# Patient Record
Sex: Female | Born: 1965 | Race: White | Hispanic: No | Marital: Married | Smoking: Current every day smoker
Health system: Southern US, Community
[De-identification: ages and names within clinical notes are randomized; demographics above are authoritative.]

## PROBLEM LIST (undated history)

## (undated) DIAGNOSIS — S92909A Unspecified fracture of unspecified foot, initial encounter for closed fracture: Secondary | ICD-10-CM

## (undated) DIAGNOSIS — R238 Other skin changes: Secondary | ICD-10-CM

## (undated) DIAGNOSIS — K589 Irritable bowel syndrome without diarrhea: Secondary | ICD-10-CM

## (undated) DIAGNOSIS — D649 Anemia, unspecified: Secondary | ICD-10-CM

## (undated) DIAGNOSIS — R233 Spontaneous ecchymoses: Secondary | ICD-10-CM

## (undated) DIAGNOSIS — H547 Unspecified visual loss: Secondary | ICD-10-CM

## (undated) DIAGNOSIS — M79606 Pain in leg, unspecified: Secondary | ICD-10-CM

## (undated) DIAGNOSIS — M1990 Unspecified osteoarthritis, unspecified site: Secondary | ICD-10-CM

## (undated) DIAGNOSIS — F32A Depression, unspecified: Secondary | ICD-10-CM

## (undated) DIAGNOSIS — N200 Calculus of kidney: Secondary | ICD-10-CM

## (undated) DIAGNOSIS — L659 Nonscarring hair loss, unspecified: Secondary | ICD-10-CM

## (undated) DIAGNOSIS — F410 Panic disorder [episodic paroxysmal anxiety] without agoraphobia: Secondary | ICD-10-CM

## (undated) DIAGNOSIS — R296 Repeated falls: Secondary | ICD-10-CM

## (undated) DIAGNOSIS — F329 Major depressive disorder, single episode, unspecified: Secondary | ICD-10-CM

## (undated) DIAGNOSIS — G43909 Migraine, unspecified, not intractable, without status migrainosus: Secondary | ICD-10-CM

## (undated) DIAGNOSIS — R200 Anesthesia of skin: Secondary | ICD-10-CM

## (undated) DIAGNOSIS — R413 Other amnesia: Secondary | ICD-10-CM

## (undated) HISTORY — DX: Panic disorder (episodic paroxysmal anxiety): F41.0

## (undated) HISTORY — PX: FOOT SURGERY: SHX648

## (undated) HISTORY — DX: Other amnesia: R41.3

## (undated) HISTORY — DX: Repeated falls: R29.6

## (undated) HISTORY — DX: Migraine, unspecified, not intractable, without status migrainosus: G43.909

## (undated) HISTORY — DX: Unspecified visual loss: H54.7

## (undated) HISTORY — DX: Unspecified osteoarthritis, unspecified site: M19.90

## (undated) HISTORY — DX: Depression, unspecified: F32.A

## (undated) HISTORY — DX: Other skin changes: R23.8

## (undated) HISTORY — PX: ABDOMINAL HYSTERECTOMY: SHX81

## (undated) HISTORY — PX: LAPAROSCOPIC GASTRIC BANDING: SHX1100

## (undated) HISTORY — DX: Irritable bowel syndrome, unspecified: K58.9

## (undated) HISTORY — PX: CHOLECYSTECTOMY: SHX55

## (undated) HISTORY — DX: Pain in leg, unspecified: M79.606

## (undated) HISTORY — DX: Calculus of kidney: N20.0

## (undated) HISTORY — DX: Major depressive disorder, single episode, unspecified: F32.9

## (undated) HISTORY — DX: Spontaneous ecchymoses: R23.3

## (undated) HISTORY — PX: TONSILLECTOMY: SUR1361

## (undated) HISTORY — PX: ABDOMINOPLASTY: SUR9

## (undated) HISTORY — DX: Unspecified fracture of unspecified foot, initial encounter for closed fracture: S92.909A

## (undated) HISTORY — DX: Anemia, unspecified: D64.9

## (undated) HISTORY — DX: Nonscarring hair loss, unspecified: L65.9

## (undated) HISTORY — DX: Anesthesia of skin: R20.0

---

## 1999-05-20 ENCOUNTER — Emergency Department (HOSPITAL_COMMUNITY): Admission: EM | Admit: 1999-05-20 | Discharge: 1999-05-20 | Payer: Self-pay | Admitting: Emergency Medicine

## 1999-08-03 ENCOUNTER — Emergency Department (HOSPITAL_COMMUNITY): Admission: EM | Admit: 1999-08-03 | Discharge: 1999-08-03 | Payer: Self-pay | Admitting: Emergency Medicine

## 2001-01-22 ENCOUNTER — Other Ambulatory Visit: Admission: RE | Admit: 2001-01-22 | Discharge: 2001-01-22 | Payer: Self-pay | Admitting: Family Medicine

## 2001-03-01 ENCOUNTER — Emergency Department (HOSPITAL_COMMUNITY): Admission: EM | Admit: 2001-03-01 | Discharge: 2001-03-02 | Payer: Self-pay | Admitting: Emergency Medicine

## 2001-03-02 ENCOUNTER — Encounter: Payer: Self-pay | Admitting: Emergency Medicine

## 2001-03-04 ENCOUNTER — Encounter: Payer: Self-pay | Admitting: Urology

## 2001-03-04 ENCOUNTER — Ambulatory Visit (HOSPITAL_COMMUNITY): Admission: RE | Admit: 2001-03-04 | Discharge: 2001-03-04 | Payer: Self-pay | Admitting: Urology

## 2004-05-07 ENCOUNTER — Encounter: Admission: RE | Admit: 2004-05-07 | Discharge: 2004-05-07 | Payer: Self-pay | Admitting: *Deleted

## 2004-05-07 ENCOUNTER — Ambulatory Visit (HOSPITAL_COMMUNITY): Admission: RE | Admit: 2004-05-07 | Discharge: 2004-05-07 | Payer: Self-pay | Admitting: *Deleted

## 2004-05-22 ENCOUNTER — Encounter: Admission: RE | Admit: 2004-05-22 | Discharge: 2004-08-20 | Payer: Self-pay | Admitting: *Deleted

## 2004-07-03 ENCOUNTER — Encounter: Admission: RE | Admit: 2004-07-03 | Discharge: 2004-07-03 | Payer: Self-pay | Admitting: *Deleted

## 2004-09-03 ENCOUNTER — Observation Stay (HOSPITAL_COMMUNITY): Admission: RE | Admit: 2004-09-03 | Discharge: 2004-09-04 | Payer: Self-pay | Admitting: *Deleted

## 2004-10-08 ENCOUNTER — Encounter: Admission: RE | Admit: 2004-10-08 | Discharge: 2004-12-17 | Payer: Self-pay | Admitting: *Deleted

## 2005-03-14 ENCOUNTER — Encounter: Admission: RE | Admit: 2005-03-14 | Discharge: 2005-03-14 | Payer: Self-pay | Admitting: *Deleted

## 2006-10-29 ENCOUNTER — Ambulatory Visit (HOSPITAL_COMMUNITY): Admission: RE | Admit: 2006-10-29 | Discharge: 2006-10-29 | Payer: Self-pay | Admitting: Obstetrics and Gynecology

## 2006-10-29 ENCOUNTER — Encounter (INDEPENDENT_AMBULATORY_CARE_PROVIDER_SITE_OTHER): Payer: Self-pay | Admitting: Specialist

## 2007-01-20 ENCOUNTER — Inpatient Hospital Stay (HOSPITAL_COMMUNITY): Admission: AD | Admit: 2007-01-20 | Discharge: 2007-01-20 | Payer: Self-pay | Admitting: Obstetrics and Gynecology

## 2007-04-08 ENCOUNTER — Inpatient Hospital Stay (HOSPITAL_COMMUNITY): Admission: RE | Admit: 2007-04-08 | Discharge: 2007-04-10 | Payer: Self-pay | Admitting: Obstetrics and Gynecology

## 2007-04-08 ENCOUNTER — Encounter (INDEPENDENT_AMBULATORY_CARE_PROVIDER_SITE_OTHER): Payer: Self-pay | Admitting: Specialist

## 2007-06-03 ENCOUNTER — Emergency Department (HOSPITAL_COMMUNITY): Admission: EM | Admit: 2007-06-03 | Discharge: 2007-06-03 | Payer: Self-pay | Admitting: Emergency Medicine

## 2008-04-23 ENCOUNTER — Emergency Department (HOSPITAL_COMMUNITY): Admission: EM | Admit: 2008-04-23 | Discharge: 2008-04-24 | Payer: Self-pay | Admitting: Emergency Medicine

## 2008-05-02 ENCOUNTER — Encounter: Admission: RE | Admit: 2008-05-02 | Discharge: 2008-05-02 | Payer: Self-pay | Admitting: Family Medicine

## 2008-05-02 ENCOUNTER — Observation Stay (HOSPITAL_COMMUNITY): Admission: EM | Admit: 2008-05-02 | Discharge: 2008-05-03 | Payer: Self-pay | Admitting: Emergency Medicine

## 2008-05-06 ENCOUNTER — Encounter (INDEPENDENT_AMBULATORY_CARE_PROVIDER_SITE_OTHER): Payer: Self-pay | Admitting: *Deleted

## 2008-05-06 ENCOUNTER — Ambulatory Visit (HOSPITAL_COMMUNITY): Admission: RE | Admit: 2008-05-06 | Discharge: 2008-05-07 | Payer: Self-pay | Admitting: *Deleted

## 2010-03-25 ENCOUNTER — Encounter: Admission: RE | Admit: 2010-03-25 | Discharge: 2010-03-25 | Payer: Self-pay | Admitting: Family Medicine

## 2010-12-31 ENCOUNTER — Emergency Department (HOSPITAL_BASED_OUTPATIENT_CLINIC_OR_DEPARTMENT_OTHER)
Admission: EM | Admit: 2010-12-31 | Discharge: 2010-12-31 | Payer: Self-pay | Source: Home / Self Care | Admitting: Emergency Medicine

## 2011-01-10 ENCOUNTER — Encounter
Admission: RE | Admit: 2011-01-10 | Discharge: 2011-01-10 | Payer: Self-pay | Source: Home / Self Care | Attending: Surgery | Admitting: Surgery

## 2011-03-10 LAB — DIFFERENTIAL
Basophils Absolute: 0 10*3/uL (ref 0.0–0.1)
Basophils Relative: 0 % (ref 0–1)
Eosinophils Absolute: 0.1 10*3/uL (ref 0.0–0.7)
Eosinophils Relative: 1 % (ref 0–5)
Lymphocytes Relative: 29 % (ref 12–46)
Lymphs Abs: 1.9 10*3/uL (ref 0.7–4.0)
Monocytes Absolute: 0.5 10*3/uL (ref 0.1–1.0)
Monocytes Relative: 8 % (ref 3–12)
Neutro Abs: 4 10*3/uL (ref 1.7–7.7)
Neutrophils Relative %: 62 % (ref 43–77)

## 2011-03-10 LAB — BASIC METABOLIC PANEL
BUN: 6 mg/dL (ref 6–23)
CO2: 24 mEq/L (ref 19–32)
Calcium: 8.9 mg/dL (ref 8.4–10.5)
Chloride: 108 mEq/L (ref 96–112)
Creatinine, Ser: 0.8 mg/dL (ref 0.4–1.2)
GFR calc Af Amer: >60 mL/min (ref >60)
GFR calc non Af Amer: >60 mL/min (ref >60)
Glucose, Bld: 77 mg/dL (ref 70–99)
Potassium: 4 mEq/L (ref 3.5–5.1)
Sodium: 144 mEq/L (ref 135–145)

## 2011-03-10 LAB — URINALYSIS, ROUTINE W REFLEX MICROSCOPIC
Bilirubin Urine: NEGATIVE
Glucose, UA: NEGATIVE mg/dL
Hgb urine dipstick: NEGATIVE
Ketones, ur: NEGATIVE mg/dL
Nitrite: NEGATIVE
Protein, ur: NEGATIVE mg/dL
Specific Gravity, Urine: 1.02 (ref 1.005–1.030)
Urobilinogen, UA: 1 mg/dL (ref 0.0–1.0)
pH: 6.5 (ref 5.0–8.0)

## 2011-03-10 LAB — CBC
HCT: 41.7 % (ref 36.0–46.0)
Hemoglobin: 14.6 g/dL (ref 12.0–15.0)
MCH: 30.9 pg (ref 26.0–34.0)
MCHC: 35 g/dL (ref 30.0–36.0)
MCV: 88.3 fL (ref 78.0–100.0)
Platelets: 271 10*3/uL (ref 150–400)
RBC: 4.72 MIL/uL (ref 3.87–5.11)
RDW: 12.5 % (ref 11.5–15.5)
WBC: 6.5 10*3/uL (ref 4.0–10.5)

## 2011-05-13 NOTE — H&P (Signed)
[name], [name]                ACCOUNT NO.:  [id number]   MEDICAL RECORD NO.:  [id number]          PATIENT TYPE:  EMS   LOCATION:  ED                           FACILITY:  [facility]   PHYSICIAN:  John C. Madilyn Fireman, M.D.    DATE OF BIRTH:  06-16-1966   DATE OF ADMISSION:  05/02/2008  DATE OF DISCHARGE:                              HISTORY & PHYSICAL   CHIEF COMPLAINT:  Abdominal pain, nausea, and vomiting.   HISTORY OF PRESENT ILLNESS:  The patient is a 45 year old white female  who presents with recurrent epigastric abdominal pain, nausea, and  vomiting.  She had a similar episode a week ago, came to the emergency  room, and had abdominal CT scan which was unrevealing, and had  elevated  liver function tests, with bilirubin 1.4, AST 210, ALT 81, and a  slightly elevated lipase at 76.  At that time, it was noted that on her  abdominal CT, which was otherwise unrevealing, there was some pulmonary  small pulmonary.  Will nodules at the bases, and a CT of the chest was  recommended.  This was done in addition to abdominal ultrasound this  morning.  The abdominal ultrasound showed a normal liver, with a 7.9 mm  common bile duct and numerous small layering echogenic gallstones with  acoustic shadowing, with no evidence of acute cholecystitis.  The chest  CT showed several pulmonary nodules, no larger than 5.7 mm.  They were  felt likely to be benign, and they recommended a follow-up noncontrast  CT in 6 months.   Shortly after her ultrasound, she ate at [employer] and then  developed a recurrent severe abdominal pain, nausea,  and vomiting, and  came back to the emergency room.  This time, she had a bilirubin of 1.7,  ALT 125, AST 346, and a lipase of 89.  Her pain has been somewhat  relieved by medicines given in the emergency room.   PAST MEDICAL HISTORY:  Obesity, status post lap band surgery 3 years  ago.   SURGERIES:  1. Hysterectomy  2. Cyst removed from the neck.  3. Tummy  tuck surgery.   MEDICATIONS:  None.   ALLERGIES:  None.   SOCIAL HISTORY:  The patient is married.  She smokes 1 pack cigarettes a  day.  Denies alcohol use.  She works in a day care center.   FAMILY HISTORY:  Positive for hypertension, otherwise unremarkable.   PHYSICAL EXAMINATION:  GENERAL:  Well-developed, well-nourished,  moderately obese white female, currently in no acute distress.  HEART:  Regular rate and rhythm, without murmurs.  LUNGS:  Clear.  ABDOMEN:  Soft, nondistended, with normoactive bowel sounds.  There was  mild epigastric tenderness, with no Murphy's sign.   IMPRESSION:  1. Symptomatic gallstones, with possible common retained or passed      common bile duct stones.  2..  History of lap band surgery.   PLAN:  Will admit.  Follow liver function tests, amylase, and lipase  tomorrow.  Consult surgery.  Make decisions of whether to perform preop  ERCP prior to eventual  cholecystectomy, and whether lap band will need  to be deflated for this.           ______________________________  Everardo All. Madilyn Fireman, M.D.     JCH/MEDQ  D:  05/02/2008  T:  05/02/2008  Job:  [id number]   cc:   Rema Fendt, PA  [facility]   Anselm Pancoast. Zachery Dakins, M.D.  [address]  [place]  [place] [address]

## 2011-05-13 NOTE — Op Note (Signed)
[name], [name] NO.:  [id number]   MEDICAL RECORD NO.:  [id number]          PATIENT TYPE:  OIB   LOCATION:  1306                         FACILITY:  [facility]   PHYSICIAN:  Alfonse Ras, MD   DATE OF BIRTH:  05-17-1966   DATE OF PROCEDURE:  05/06/2008  DATE OF DISCHARGE:                               OPERATIVE REPORT   PREOPERATIVE DIAGNOSES:  Probable history of choledocholithiasis,  cholelithiasis, and possible malfunctioning lap band port.   POSTOPERATIVE DIAGNOSES:  Probable history of choledocholithiasis,  cholelithiasis, and possible malfunctioning lap band port.   PROCEDURE:  Laparoscopic cholecystectomy with intraoperative  cholangiogram and replacement of the lap band port.   FINDINGS:  Normal cholangiogram with difficult visualization of the  common hepatic duct but visualization of the right and left hepatic  ducts, common duct, and free flow into the duodenum without obstruction.   CONSULTING PHYSICIAN:  Dr. Oley Balm, radiology.   SURGEON:  Alfonse Ras, MD   ASSISTANT:  Clovis Pu. Cornett, M.D.   ANESTHESIA:  General.   DESCRIPTION:  The patient was taken to the operating room, placed in the  supine position.  After adequate general anesthesia was induced using  endotracheal tube, the abdomen was prepped and draped in the normal  sterile fashion.  The lap band port was accessed and I aspirated only 1  cc of saline which was all that was present.  At the time of the last  fill, the patient had 3 cc within it and therefore, I determined her  port to be malfunctioning.  That fluid was removed.   I then made a supraumbilical vertical incision and dissected down to the  fascia which was opened vertically under direct vision.  The tubing was  visualized in the anterior aspect of this incision.  An 0 Vicryl purse-  string suture was placed around the fascial defect and the peritoneum  was entered.  The [facility] trocar was placed in the  abdomen, and  pneumoperitoneum was obtained.  Under direct visualization, 11 mm trocar  was placed in subxiphoid region, two 5 mm ports were placed in the right  abdomen.  The gallbladder was identified and retracted cephalad.  Dissection at the infundibulum easily identified the cystic duct and  common bile duct.  Critical view was obtained.  The cystic duct was  clipped proximally and ductotomy was made.  Cholangiogram was performed  which showed free flow into the duodenum and normal common bile duct.  However, the right hepatic duct could be visualized but the common  hepatic duct and left hepatic duct were poorly visualized.  The  cholangiocatheter was then pulled back and cholangiogram was then  performed again.  There was some appearance with the infraumbilical  trocar and so that was removed and repeat cholangiogram was performed.  This did show flow into the left hepatic duct and some vague  visualization of the common hepatic duct.  Because of good anatomic  delineation, I opted to remove the cholangiocatheter and divided the  cystic duct right up near the gallbladder.  Cystic artery was  dissected  in a similar fashion, triply clipped, and divided.  The gallbladder was  taken off the gallbladder bed using Bovie electrocautery.  There was a  small rent made in the posterior wall of the gallbladder during  dissection with minimal bilious spillage.  The gallbladder was placed in  EndoCatch bag and the right upper quadrant was copiously irrigated.   I then turned my attention to replacement of the lap band.  The port was  injected with 3 cc of methylene blue diluted saline and watched the  pouch and band, and there was no evidence of leak.  The gallbladder was  then removed through the umbilical port and the wounds were copiously  irrigated with antibiotic saline. Pneumoperitoneum was released and the  trocars were removed.   I then made an incision over the port in the right mid  abdomen and  dissected down.  The capsule around the port was excised and the port  was mobilized off the fascia.  It was again injected with methylene blue  dye, and there was no obvious evidence of leak but because of the low  volume at initial aspiration, I opted to replace the port.  The tubing  was cut new port was attached after being flushed, and it was fixed to  the anterior abdominal wall fascia with interrupted 2-0 Prolene.  It was  injected with 2.5 mL of saline.  The wounds again were copiously  irrigated with antibiotic saline.  Skin was closed with staples.  Sterile dressings were applied.  The patient tolerated the procedure  well and went to PACU in good condition.      Alfonse Ras, MD  Electronically Signed     KRE/MEDQ  D:  05/06/2008  T:  05/07/2008  Job:  [id number]

## 2011-05-16 NOTE — Op Note (Signed)
NAME:  SIAH, KANNAN                          ACCOUNT NO.:  [id number]   MEDICAL RECORD NO.:  [id number]                   PATIENT TYPE:  OBV   LOCATION:  0484                                 FACILITY[facility]   PHYSICIAN:  Vikki Ports, M.D.         DATE OF BIRTH:  01/16/1966   DATE OF PROCEDURE:  DATE OF DISCHARGE:                                 OPERATIVE REPORT   No dictation.                                               Vikki Ports, M.D.    KRH/MEDQ  D:  09/03/2004  T:  09/04/2004  Job:  [id number]

## 2011-05-16 NOTE — H&P (Signed)
[name], [name]                ACCOUNT NO.:  [id number]   MEDICAL RECORD NO.:  [id number]          PATIENT TYPE:  AMB   LOCATION:  DAY                           FACILITY:  [facility]   PHYSICIAN:  Tilda Burrow, M.D. DATE OF BIRTH:  12-18-1966   DATE OF ADMISSION:  DATE OF DISCHARGE:  LH                              HISTORY & PHYSICAL   ADMITTING DIAGNOSES:  1. Failed endometrial ablation, suspected adenomyosis.  2. History of cesarean section with infection.  3. Abdominal wall laxity, status post weight loss.  4. History of gastric banding procedure.   HISTORY OF PRESENT ILLNESS:  This 45 year old female, gravida 2, para 1,  is admitted for abdominal hysterectomy.  [name] has a history of an  endometrial ablation in [place] to address heavy bleeding.  She had a  failed response to the ablation.  She has had recommended hysterectomy  with the patient deciding to seek second opinion.  Given the severity of  the bleeding with associated pain of menses, hysterectomy certainly  seems reasonable.  She desires to have ovarian preservation, unless  gross abnormalities are identified.   Additionally, [name] desires partial panniculectomy to address the  significant abdominal wall laxity that has accompanied her weight loss.  Currently, her weight is approximately 170, after a 100-pound weight  loss from laparotomy banding procedure performed by Dr. Tenny Craw.  Plans are  for abdominal hysterectomy through a Pelosi-type incision, which will be  extended laterally to excise redundant skin and subcutaneous tissue  fatty tissue.  The patient additionally will require repositioning of  the umbilicus.  She has a left Lap-Band access site just superior and to  the right of the umbilicus.  We will be careful to allow access to this  port site to remain.  She is aware that there will be additional  scarring and will be physical changes in the area of the umbilicus.   PAST MEDICAL HISTORY:  Pap  smears normal, most recently September 2007.   ALLERGIES:  No known drug allergies.   CURRENT MEDICATIONS:  None.   SURGICAL HISTORY:  1. C-section 16 years ago with postoperative wound infection.  2. Lap banding in 2005.  3. History of adenomyosis and small fibroid in the uterus.  4. She has a history of mild depression, not currently considered      active by patient.   SOCIAL HISTORY:  Consists of walking.  She smokes 1/4 pack per day and  is a nondrinker.   PHYSICAL EXAMINATION:  VITAL SIGNS:  Height 5 feet 6 inches, weight 172.  Blood pressure 108/82.  HEENT:  Pupils are equal, round and reactive.  Extraocular movements  intact.  NECK:  Supple.  CHEST:  Clear to auscultation.  ABDOMEN:  Abdominal wall laxity with a very low C-section incision just  above the mons pubis.  There is scarring and retraction in this area.  The patient has some chronic moisture problems in this area as well as  at the umbilicus associated with her skin laxity.  PELVIC:  External genitalia:  Normal female.  Vaginal exam:  Normal,  adequate secretions.  Cervix normal.   ACCESSORY CLINICAL DATA:  Recent Pap smear, 2007, reported as normal.   Ultrasound shows myometrial abnormality consistent with adenomyosis.   PLAN:  Abdominal hysterectomy, preservation of ovaries unless distinct  problems identified, partial panniculectomy with umbilical  repositioning.      Tilda Burrow, M.D.  Electronically Signed     JVF/MEDQ  D:  04/08/2007  T:  04/08/2007  Job:  [id number]

## 2011-05-16 NOTE — Op Note (Signed)
NAMETHEO, REITHER NO.:  [id number]   MEDICAL RECORD NO.:  [id number]          PATIENT TYPE:  INP   LOCATION:  A428                          FACILITY:  [facility]   PHYSICIAN:  Tilda Burrow, M.D. DATE OF BIRTH:  [birth year]   DATE OF PROCEDURE:  04/08/2007  DATE OF DISCHARGE:  04/10/2007                               OPERATIVE REPORT   PREOPERATIVE DIAGNOSIS:  Failed endometrial ablation.   POSTOPERATIVE DIAGNOSIS:  Failed endometrial ablation.   PROCEDURE:  Abdominal hysterectomy, panniculectomy with umbilical hernia  repositioning.   SURGEON:  Tilda Burrow, M.D.   ASSISTANT:  __________   ANESTHESIA:  General.   SPECIMENS:  Uterus, panniculus sent to lab.   ESTIMATED BLOOD LOSS:  250 mL.   COMPLICATIONS:  None.   INDICATIONS:  A 45 year old status post gastric banding procedure with  100-pound weight loss and marked abdominal wall laxity.   DETAILS OF PROCEDURE:  Patient was taken to the operating room, prepped  and draped for lower abdominal surgery.  Previously marked lower  abdominal laxity was treated by making an 80-cm-long elliptical incision  from the mons pubis just below the old poorly healed C section scar  transversely to within 4 cm of the inguinal crease staying approximately  3 cm above the inguinal area and extending around to approximately 10 cm  past the anterior/superior iliac crest.  The skin was undermined being  careful to maintain a good layer of fatty tissue overlying the fascia,  with mobilization of the layer of skin upward.  The previously marked  superior aspect of the incision could be breeched from underneath and  then the upper most aspects of the incision were transected removing an  80-cm long by approximately 20 cm wide ellipse of skin and fatty tissue.  The lateral aspects of the incision were irrigated and then closed with  2 layers of subcuticular 2-0 plain suture pulling the fatty tissue edges  together  side-to-side with excellent cosmetic and tissue edge  approximation.  The middle third of the incision was left opened and  then we proceeded with the hysterectomy.   Hysterectomy involved a Pfannenstiel type incision midline in through  the peritoneal cavity and elevation of the bowel out of the pelvis.  Uterus was grasped.  Round ligaments were taken down, clamped, cut and  suture ligated.  The bladder flap was developed anteriorly.  The  infundibulopelvic ligaments identified.  The ovaries visualized and the  round ligament and utero-ovarian ligament and fallopian tube taken down  as a pedicle, clamping, cutting and suture ligating.  The ovaries were  preserved.  The broad ligaments were then skeletonized down to the  uterine vessels which were cross clamped with a curved Haney clamp with  a Kelly for backbleeding, transected and suture ligated with 0 chromic.  Upper and lower cardinal ligaments were then serially clamped, cut and  suture ligated with 0 chromic marching down to the level of the cervix  where an anterior stab incision into the cervical vaginal fornix was  performed.  The cuff and cervix separated, and the  cuff grasped with 4  Kocher clamps.  Aldridge stitches were placed at each lateral vaginal  angle attaching the lower cardinal ligaments to the cuff remnants.  The  remainder of the cuff was closed with interrupted 0 Vicryl sutures.  The  pelvis was irrigated.  The bladder flap reapproximated with 2-0 chromic,  and then laparotomy equipment removed.  Sponge and needle counts  correct.  Anterior peritoneum closed with 2-0 chromic, and fascia  trimmed of approximately 2 cm of fibrous tissue, and then pulled  together in the midline with good approximation.  Subcutaneous tissue  were then closed after placing a flat JP drain in the lateral aspects of  the incision and allowing it to exit through a separate stab incision in  the suprapubic area.  Prior to full closure of  this, we proceeded with  the umbilical release.   As noted earlier, the patient has a port site just superior and to the  right of the umbilicus where the gastric band can be accessed for  further insufflation of deflation.  Throughout the umbilical  repositioning, this was kept cautiously and careful attention so that we  did not contact it at any time during the dissection.  The umbilicus was  circumscribed with Bovie cautery, dissected free with a small loop of  fatty tissue around it, and released so that it could fall into the  subcutaneous tissue.  Then the Pfannenstiel type incision was pulled  downward and repositioned, allowing [place] to identify a site approximately  5-7 cm above the original umbilical site where an opening could be made  and the umbilicus pulled through.  The umbilicus was repositioned using  4 interrupted sutures of 2-0 plain to pull the umbilicus to the surface,  and then stapled in position using standard staples.   The Pfannenstiel incision was then closed with staples, and subcutaneous  2-0 plain suture, and then the JP drains sutured in place and then  decompressed to develop suction.  EBL 250 mL.  Sponge and needle counts  correct.  Patient went to recovery room in good condition.      Tilda Burrow, M.D.  Electronically Signed     JVF/MEDQ  D:  04/15/2007  T:  04/16/2007  Job:  [id number]   cc:   ______________

## 2011-05-16 NOTE — Op Note (Signed)
[name], [name]                          ACCOUNT NO.:  [id number]   MEDICAL RECORD NO.:  [id number]                   PATIENT TYPE:  OBV   LOCATION:  0484                                 [facility]AC[facility]   PHYSICIAN:  Vikki Ports, M.D.         DATE OF BIRTH:  1966-05-20   DATE OF PROCEDURE:  09/03/2004  DATE OF DISCHARGE:                                 OPERATIVE REPORT   PREOPERATIVE DIAGNOSIS:  Morbid obesity.   POSTOPERATIVE DIAGNOSIS:  Morbid obesity.   PROCEDURE:  Laparoscopic adjustable gastric banding.   SURGEON:  Danna Hefty, MD   ASSISTANT:  Jaclynn Guarneri, MD   ANESTHESIA:  General.   DESCRIPTION OF PROCEDURE:  The patient was taken to the operating room and  placed in supine position.  After adequate general anesthesia was induced  using endotracheal tube, the abdomen was prepped and draped in normal  sterile fashion.  Using an OptiView 12 mm trocar in the left upper quadrant,  the abdomen was entered and pneumoperitoneum was obtained under direct  visualization.  Additional 11 mm trocars were placed in the right upper  quadrant, and an additional 12 mm trocar was placed in the right mid  abdomen.  Additional 5 mm port was placed in the left abdomen.  The stomach  was visualized.  There was no other intra-abdominal pathology noted.  The  angle of His was sharply and bluntly dissected.  The lap band passer was  then placed posterior to the esophagus and brought out at the angle of His.  The band was then placed in the abdomen and brought retroesophageally and  closed.  This was accomplished over the dilating balloon.  Three separate  Ethibond serosal-to-serosal sutures were placed on the proximal stomach to  secure the band in place.  It was in good orientation in the 2 to 8  orientation.  The tubing was then brought out through the most inferior  port.  Pneumoperitoneum was released.  Trocar was removed.  The tubing was  connected to the port,  and the port was tacked to the anterior abdominal  fascia with interrupted 2-0 Prolenes.  That incision was closed with a  running subcutaneous 2-0 Vicryl suture.  Skin incisions were closed with  staples.  Incisions were injected with 0.5 Marcaine.  The patient tolerated  the procedure well and went to PACU in good condition.                                               Vikki Ports, M.D.    KRH/MEDQ  D:  09/04/2004  T:  09/04/2004  Job:  [id number]

## 2011-05-16 NOTE — Op Note (Signed)
[name], [name] NO.:  [id number]   MEDICAL RECORD NO.:  [id number]          PATIENT TYPE:  AMB   LOCATION:  SDC                           FACILITY:  [facility]   PHYSICIAN:  Miguel Aschoff, M.D.       DATE OF BIRTH:  Feb 15, 1966   DATE OF PROCEDURE:  10/29/2006  DATE OF DISCHARGE:                                 OPERATIVE REPORT   PREOPERATIVE DIAGNOSIS:  Menorrhagia.   POSTOPERATIVE DIAGNOSIS:  Menorrhagia.   PROCEDURE:  Dilatation and curettage; attempted Novasure ablation, followed  by ThermaChoice endometrial ablation.   SURGEON:  Miguel Aschoff, M.D.   ANESTHESIA:  General.   COMPLICATIONS:  None.   JUSTIFICATION:  The patient is a 45 year old white female with history of  progressively heavier menses, interfering with her lifestyle and causing her  to bleed through her clothing.  The patient at this point now desires a  procedure carried out in attempt to control her heavy menses.  The risks and  benefits of the procedure were discussed with the patient.  The patient has  given informed consent for endometrial ablation.   PROCEDURE:  The patient was taken to the operating room, placed in the  supine position.  General anesthesia was administered without difficulty.  She was then placed in the dorsal lithotomy position, prepped and draped in  the usual sterile fashion.  The bladder was catheterized.  Once this was  done, a speculum was placed in the vaginal vault, anterior cervical lip was  grasped with a tenaculum.  Prior to placement of the speculum, examination  revealed normal external genitalia, normal Bartholin's and Skene's glands  and normal urethra.  The vaginal vault was without gross lesion.  The cervix  was without gross lesion.  The uterus was noted to be somewhat irregular in  shape, approximately 6-8 weeks equivalent size.  The patient was noted have  a history of having a 3 cm uterine fibroid.  The adnexa revealed no masses.  Once the speculum  was in place, the cervix was sounded to 10 cm. The  cervical length of 4 cm was established, for a cavity length of 6 cm.  After  this was done, the Novasure endometrial ablation unit was placed into the  uterine cavity, and cavity width of 3.2 cm was established.  The patient was  noted have a patulous cervix, which did not require any dilatation and was  not possible to pass the cavity assessment test.  This was felt be secondary  to the degree of dilatation of the cervix.  At this point, after 2 attempts  at a cavity assessment test, it was elected to abandon any further attempts  using the Novasure unit; and the ThermaChoice endometrial ablation unit was  brought in.  Without difficulty, an 8-minute treatment cycle at 87 degrees  was carried out with the ThermaChoice balloon, and the treatment was  completed successfully.  Once this was done, the cervix was injected with 18  cc of 1% Xylocaine.  All instruments were removed.  The patient was reversed  from the anesthetic  and taken to the recovery room in satisfactory  condition.  The endometrial curettings previously obtained were sent for  histologic study.   PLAN:  The patient is to be discharged home.   MEDICATIONS FOR HOME:  1. Darvocet N 100 1-3 every 4 hours as needed for pain.  2. Doxycycline one twice a day x3 days.   SPECIAL INSTRUCTIONS:  The patient is to call for any problems such as  fever, pain or heavy bleeding.  She will be seen back in 4 weeks for follow-  up examination.      Miguel Aschoff, M.D.  Electronically Signed     AR/MEDQ  D:  10/29/2006  T:  10/29/2006  Job:  [id number]

## 2011-05-16 NOTE — Discharge Summary (Signed)
[name], [name]                ACCOUNT NO.:  [id number]   MEDICAL RECORD NO.:  [id number]          PATIENT TYPE:  INP   LOCATION:  A428                          FACILITY:  [facility]   PHYSICIAN:  Lazaro Arms, M.D.   DATE OF BIRTH:  15-Apr-1966   DATE OF ADMISSION:  04/08/2007  DATE OF DISCHARGE:  04/12/2008LH                               DISCHARGE SUMMARY   DISCHARGE DIAGNOSES:  1. Status post an abdominal hysterectomy with panniculectomy.  2. Unremarkable postoperative course.   PROCEDURE:  1. Abdominal hysterectomy.  2. Panniculectomy.   Please refer to the History and Physical as well as the Operative Report  for details of admission to the hospital.   HOSPITAL COURSE:  Patient was admitted to the hospital postoperatively.  She had an unremarkable postoperative course.  She tolerated clear  liquids and then a regular diet.  She had the catheter removed a little  bit early because it was quite uncomfortable but she voided without  symptoms.  Her abdominal exam was benign.  She tolerated a transition  from IV to oral pain medicine.  She was ambulatory.  Her incision was  clean, dry and intact at the time of discharge.  Her JP drainage was  appropriate, not excessive, it was mostly serosanguineous fluid.  She  did seem to respond much better to the combination of Percocet with  Toradol.  The morning of postop day #2 she was evaluated and is doing  well.  Her hemoglobin and hematocrit on postop day #1 was 12.4 and 35.7  with a white count 11,300 and, as stated previously, her vitals are  stable and she is afebrile.   She is discharged to home on the morning of postop day #2 on:  1. Percocet 7.5/325, dispense 40, 1-2 q.6 as needed for pain.  2. Toradol 10 mg p.o. q.8h. for 5 days.  3. Ciprofloxacin 500 mg twice a day for 7 days because of the      extensive abdominal wall work as well as the indwelling JP drains.   The patient is also discharged home with an abdominal binder  to use to  help support the abdominal wall to minimize postoperative pain.  She is  given instructions, precautions for return to the office but will be  seen routinely next Friday for evaluation of her incision.      Lazaro Arms, M.D.  Electronically Signed    LHE/MEDQ  D:  04/10/2007  T:  04/10/2007  Job:  [id number]

## 2011-06-17 ENCOUNTER — Encounter (INDEPENDENT_AMBULATORY_CARE_PROVIDER_SITE_OTHER): Payer: Self-pay | Admitting: Surgery

## 2011-09-08 ENCOUNTER — Encounter (INDEPENDENT_AMBULATORY_CARE_PROVIDER_SITE_OTHER): Payer: Self-pay | Admitting: Surgery

## 2011-09-23 LAB — COMPREHENSIVE METABOLIC PANEL
Alkaline Phosphatase: 67
BUN: 4 — ABNORMAL LOW
CO2: 26
GFR calc non Af Amer: >60
Glucose, Bld: 127 — ABNORMAL HIGH
Potassium: 3.9
Total Bilirubin: 1.4 — ABNORMAL HIGH
Total Protein: 6.7

## 2011-09-23 LAB — URINALYSIS, ROUTINE W REFLEX MICROSCOPIC
Bilirubin Urine: NEGATIVE
Glucose, UA: NEGATIVE
Ketones, ur: NEGATIVE
pH: 8

## 2011-09-23 LAB — CBC
HCT: 41.9
Hemoglobin: 14.3
RDW: 12.7

## 2011-09-23 LAB — DIFFERENTIAL
Basophils Absolute: 0.1
Basophils Relative: 1
Neutro Abs: 9 — ABNORMAL HIGH
Neutrophils Relative %: 85 — ABNORMAL HIGH

## 2011-09-23 LAB — LIPASE, BLOOD: Lipase: 76 — ABNORMAL HIGH

## 2011-09-24 LAB — COMPREHENSIVE METABOLIC PANEL
ALT: 125 — ABNORMAL HIGH
ALT: 170 — ABNORMAL HIGH
AST: 27
AST: 346 — ABNORMAL HIGH
Albumin: 3.5
Alkaline Phosphatase: 56
Alkaline Phosphatase: 60
BUN: 3 — ABNORMAL LOW
BUN: 6
CO2: 26
CO2: 27
Calcium: 9.3
Creatinine, Ser: 0.82
GFR calc Af Amer: >60
GFR calc Af Amer: >60
GFR calc non Af Amer: >60
GFR calc non Af Amer: >60
Glucose, Bld: 90
Potassium: 3.7
Potassium: 4.5
Sodium: 138
Sodium: 138
Sodium: 140
Total Bilirubin: 1.4 — ABNORMAL HIGH
Total Protein: 6.5
Total Protein: 6.7

## 2011-09-24 LAB — DIFFERENTIAL
Basophils Relative: 1
Eosinophils Absolute: 0.1
Eosinophils Relative: 1
Eosinophils Relative: 4
Lymphocytes Relative: 13
Lymphs Abs: 1.1
Monocytes Absolute: 0.3
Monocytes Relative: 6
Monocytes Relative: 8
Neutro Abs: 3.4
Neutrophils Relative %: 77

## 2011-09-24 LAB — CBC
HCT: 38.8
Hemoglobin: 13.1
MCHC: 34.1
MCV: 96.8
Platelets: 232
RBC: 3.98
RDW: 12.5
RDW: 12.6
WBC: 5.4

## 2011-09-24 LAB — LIPASE, BLOOD
Lipase: 23
Lipase: 89 — ABNORMAL HIGH

## 2011-09-24 LAB — AMYLASE: Amylase: 54

## 2011-10-16 LAB — LIPASE, BLOOD: Lipase: 57

## 2011-10-16 LAB — CBC
HCT: 42.3
MCV: 92.4
Platelets: 278
RDW: 13.1

## 2011-10-16 LAB — HEPATIC FUNCTION PANEL
Indirect Bilirubin: 0.8
Total Protein: 6.7

## 2011-10-16 LAB — BASIC METABOLIC PANEL
BUN: 8
Chloride: 108
GFR calc non Af Amer: >60
Glucose, Bld: 125 — ABNORMAL HIGH
Potassium: 3.2 — ABNORMAL LOW

## 2011-10-16 LAB — DIFFERENTIAL
Basophils Absolute: 0
Eosinophils Absolute: 0.1
Eosinophils Relative: 1

## 2011-10-16 LAB — URINALYSIS, ROUTINE W REFLEX MICROSCOPIC
Nitrite: NEGATIVE
Specific Gravity, Urine: 1.028
Urobilinogen, UA: 1

## 2011-10-16 LAB — POCT PREGNANCY, URINE
Operator id: 173591
Preg Test, Ur: NEGATIVE

## 2012-01-01 ENCOUNTER — Emergency Department (INDEPENDENT_AMBULATORY_CARE_PROVIDER_SITE_OTHER): Payer: 59

## 2012-01-01 ENCOUNTER — Emergency Department (HOSPITAL_BASED_OUTPATIENT_CLINIC_OR_DEPARTMENT_OTHER)
Admission: EM | Admit: 2012-01-01 | Discharge: 2012-01-01 | Disposition: A | Discharge status: Home / Self Care | Payer: 59 | Attending: Emergency Medicine | Admitting: Emergency Medicine

## 2012-01-01 ENCOUNTER — Encounter (HOSPITAL_BASED_OUTPATIENT_CLINIC_OR_DEPARTMENT_OTHER): Payer: Self-pay | Admitting: *Deleted

## 2012-01-01 DIAGNOSIS — R0989 Other specified symptoms and signs involving the circulatory and respiratory systems: Secondary | ICD-10-CM

## 2012-01-01 DIAGNOSIS — J40 Bronchitis, not specified as acute or chronic: Secondary | ICD-10-CM | POA: Insufficient documentation

## 2012-01-01 DIAGNOSIS — R0602 Shortness of breath: Secondary | ICD-10-CM

## 2012-01-01 DIAGNOSIS — R05 Cough: Secondary | ICD-10-CM

## 2012-01-01 DIAGNOSIS — F17200 Nicotine dependence, unspecified, uncomplicated: Secondary | ICD-10-CM | POA: Insufficient documentation

## 2012-01-01 DIAGNOSIS — J984 Other disorders of lung: Secondary | ICD-10-CM | POA: Insufficient documentation

## 2012-01-01 DIAGNOSIS — I10 Essential (primary) hypertension: Secondary | ICD-10-CM

## 2012-01-01 LAB — CBC
HCT: 46.1 % — ABNORMAL HIGH (ref 36.0–46.0)
MCHC: 34.7 g/dL (ref 30.0–36.0)
RDW: 13.5 % (ref 11.5–15.5)

## 2012-01-01 LAB — COMPREHENSIVE METABOLIC PANEL
AST: 56 U/L — ABNORMAL HIGH (ref 0–37)
Albumin: 3.7 g/dL (ref 3.5–5.2)
Calcium: 9.7 mg/dL (ref 8.4–10.5)
Chloride: 101 mEq/L (ref 96–112)
Creatinine, Ser: 0.9 mg/dL (ref 0.50–1.10)
Total Bilirubin: 0.6 mg/dL (ref 0.3–1.2)
Total Protein: 7.8 g/dL (ref 6.0–8.3)

## 2012-01-01 LAB — DIFFERENTIAL
Basophils Absolute: 0 10*3/uL (ref 0.0–0.1)
Basophils Relative: 1 % (ref 0–1)
Monocytes Absolute: 0.4 10*3/uL (ref 0.1–1.0)
Neutro Abs: 5.5 10*3/uL (ref 1.7–7.7)
Neutrophils Relative %: 66 % (ref 43–77)

## 2012-01-01 MED ORDER — SODIUM CHLORIDE 0.9 % IV SOLN
Freq: Once | INTRAVENOUS | Status: AC
  Administered 2012-01-01: 15:00:00 via INTRAVENOUS

## 2012-01-01 MED ORDER — IOHEXOL 350 MG/ML SOLN
80.0000 mL | Freq: Once | INTRAVENOUS | Status: AC | PRN
  Administered 2012-01-01: 80 mL via INTRAVENOUS

## 2012-01-01 MED ORDER — IBUPROFEN 800 MG PO TABS
ORAL_TABLET | ORAL | Status: AC
  Administered 2012-01-01: 800 mg
  Filled 2012-01-01: qty 1

## 2012-01-01 MED ORDER — AZITHROMYCIN 250 MG PO TABS: 250.0000 mg | ORAL_TABLET | Freq: Every day | ORAL | Status: AC

## 2012-01-01 NOTE — ED Notes (Signed)
Pt to triage in w/c, a/a/ox4 in nad, resp easy and reg, reports one year of intermittant sob, worse over last few days. Pt states she has been seen by her pcp several times for same and is told "panic attacks". Pt states she saw her pcp this am who told her to come to ed for eval. Pt reports cough and congestion x 2 weeks also.

## 2012-01-01 NOTE — ED Provider Notes (Signed)
History     CSN: [id number]  Arrival date & time 01/01/12  1206   First MD Initiated Contact with Patient 01/01/12 1328      Chief Complaint  Patient presents with  . Shortness of Breath    (Consider location/radiation/quality/duration/timing/severity/associated sxs/prior treatment) Patient is a 46 y.o. female presenting with cough. The history is provided by the patient. No language interpreter was used.  Cough This is a recurrent problem. The current episode started more than 1 week ago. The problem occurs constantly. The problem has been gradually worsening. There has been no fever. The fever has been present for less than 1 day. Associated symptoms include shortness of breath and wheezing. The treatment provided no relief. She is a smoker. Her past medical history is significant for bronchitis.  Pt sent here from [facility] for concern of PE.  Pt reports she has had increasing shortness of breath for several weeks.    Past Medical History  Diagnosis Date  . Panic disorder   . Extremity numbness     legs, feet, arms, hands  . Foot fracture     bilateral  . Frequent falls   . Vision problems   . Hair loss   . Memory loss   . Hypotension   . Abdominal pain   . Leg pain     when driving long distances  . Bruises easily   . Kidney stones   . Arthritis   . Anemia   . Irritable bowel     Past Surgical History  Procedure Date  . Cholecystectomy   . Laparoscopic gastric banding   . Foot surgery     left  . Abdominoplasty   . Abdominal hysterectomy     History reviewed. No pertinent family history.  History  Substance Use Topics  . Smoking status: Current Everyday Smoker -- 2.0 packs/day  . Smokeless tobacco: Not on file  . Alcohol Use: No    OB History    Grav Para Term Preterm Abortions TAB SAB Ect Mult Living                  Review of Systems  Respiratory: Positive for cough, shortness of breath and wheezing.   All other systems reviewed and are  negative.    Allergies  Review of patient's allergies indicates no known allergies.  Home Medications   Current Outpatient Rx  Name Route Sig Dispense Refill  . AMPHETAMINE-DEXTROAMPHET ER 10 MG PO CP24 Oral Take 10 mg by mouth every morning.      . ALBUTEROL SULFATE HFA 108 (90 BASE) MCG/ACT IN AERS Inhalation Inhale 2 puffs into the lungs every 6 (six) hours as needed.      . ALPRAZOLAM 0.5 MG PO TABS Oral Take 0.5 mg by mouth 4 (four) times daily as needed.      . CYANOCOBALAMIN 1000 MCG/ML IJ SOLN Intramuscular Inject 1,000 mcg into the muscle once.      . CYCLOBENZAPRINE HCL ER 15 MG PO CP24 Oral Take 15 mg by mouth daily as needed.      Marland Kitchen ESCITALOPRAM OXALATE 20 MG PO TABS Oral Take 20 mg by mouth daily.      . FUROSEMIDE 40 MG PO TABS Oral Take 40 mg by mouth daily.      Marland Kitchen HYDROCHLOROTHIAZIDE 25 MG PO TABS Oral Take 25 mg by mouth daily.      Marland Kitchen LISDEXAMFETAMINE DIMESYLATE 40 MG PO CAPS Oral Take 40 mg by mouth every  morning.      Marland Kitchen ZOLPIDEM TARTRATE 10 MG PO TABS Oral Take 10 mg by mouth at bedtime as needed.        BP 110/67  Pulse 73  Temp(Src) 98.2 F (36.8 C) (Oral)  Resp 20  Ht 5\' 5"  (1.651 m)  Wt 35 lb (15.876 kg)  BMI 5.82 kg/m2  SpO2 100%  Physical Exam  Vitals reviewed. Constitutional: She is oriented to person, place, and time. She appears well-developed and well-nourished.  HENT:  Head: Normocephalic and atraumatic.  Right Ear: External ear normal.  Left Ear: External ear normal.  Nose: Nose normal.  Mouth/Throat: Oropharynx is clear and moist.  Eyes: Conjunctivae and EOM are normal. Pupils are equal, round, and reactive to light.  Neck: Normal range of motion. Neck supple.  Cardiovascular: Normal rate and normal heart sounds.   Pulmonary/Chest: Effort normal.  Abdominal: Soft.  Musculoskeletal: Normal range of motion.  Neurological: She is alert and oriented to person, place, and time.  Skin: Skin is warm.  Psychiatric: She has a normal mood and  affect.    ED Course  Procedures (including critical care time)  Labs Reviewed - No data to display No results found.   No diagnosis found.    MDM   Results for orders placed during the hospital encounter of 01/01/12  CBC      Component Value Range   WBC 8.3  4.0 - 10.5 (K/uL)   RBC 5.06  3.87 - 5.11 (MIL/uL)   Hemoglobin 16.0 (*) 12.0 - 15.0 (g/dL)   HCT 16.1 (*) 09.6 - 46.0 (%)   MCV 91.1  78.0 - 100.0 (fL)   MCH 31.6  26.0 - 34.0 (pg)   MCHC 34.7  30.0 - 36.0 (g/dL)   RDW 04.5  40.9 - 81.1 (%)   Platelets 230  150 - 400 (K/uL)  DIFFERENTIAL      Component Value Range   Neutrophils Relative 66  43 - 77 (%)   Neutro Abs 5.5  1.7 - 7.7 (K/uL)   Lymphocytes Relative 26  12 - 46 (%)   Lymphs Abs 2.1  0.7 - 4.0 (K/uL)   Monocytes Relative 5  3 - 12 (%)   Monocytes Absolute 0.4  0.1 - 1.0 (K/uL)   Eosinophils Relative 3  0 - 5 (%)   Eosinophils Absolute 0.2  0.0 - 0.7 (K/uL)   Basophils Relative 1  0 - 1 (%)   Basophils Absolute 0.0  0.0 - 0.1 (K/uL)  COMPREHENSIVE METABOLIC PANEL      Component Value Range   Sodium 136  135 - 145 (mEq/L)   Potassium 4.1  3.5 - 5.1 (mEq/L)   Chloride 101  96 - 112 (mEq/L)   CO2 26  19 - 32 (mEq/L)   Glucose, Bld 114 (*) 70 - 99 (mg/dL)   BUN 7  6 - 23 (mg/dL)   Creatinine, Ser 9.14  0.50 - 1.10 (mg/dL)   Calcium 9.7  8.4 - 78.2 (mg/dL)   Total Protein 7.8  6.0 - 8.3 (g/dL)   Albumin 3.7  3.5 - 5.2 (g/dL)   AST 56 (*) 0 - 37 (U/L)   ALT 52 (*) 0 - 35 (U/L)   Alkaline Phosphatase 93  39 - 117 (U/L)   Total Bilirubin 0.6  0.3 - 1.2 (mg/dL)   GFR calc non Af Amer 76 (*) >90 (mL/min)   GFR calc Af Amer 88 (*) >90 (mL/min)   Ct Angio Chest W/cm &/  or Wo Cm  01/01/2012  *RADIOLOGY REPORT*  Clinical Data:  Shortness of breath.  Cough, congestion.  History of anxiety attacks, hypotension.  CT ANGIOGRAPHY CHEST WITH CONTRAST  Technique:  Multidetector CT imaging of the chest was performed using the standard protocol during bolus  administration of intravenous contrast.  Multiplanar CT image reconstructions including MIPs were obtained to evaluate the vascular anatomy.  Contrast: 80mL OMNIPAQUE IOHEXOL 350 MG/ML IV SOLN 03/25/2010  Comparison:  03/25/2010  Findings:  The pulmonary arteries are well opacified.  There is no evidence for acute pulmonary embolus.  The heart size is normal. The visualized portion of the thyroid gland has a normal appearance.  No mediastinal, hilar, or axillary adenopathy.  Small lung parenchymal nodules are identified.  These are identified in the right lower lobe, right middle lobe, and measures 3 - 4 mm in size.  These appear stable since prior study.  No pleural effusions, infiltrates identified.  Images of the upper abdomen show a lap band in place.  This is partially imaged. Visualized osseous structures have a normal appearance.  Review of the MIP images confirms the above findings.  IMPRESSION:  1.  Technically adequate exam showing no evidence for acute pulmonary embolus. 2.  No evidence for acute cardiopulmonary disease. 3.  Stable pulmonary nodules, consistent with benign process. 4.  Partially imaged lap band.  Original Report Authenticated By: Patterson Hammersmith, M.D.        Pt's office notes reviewed.  Pt was given Rx for zithromax.  I advised her to take zithromax.  Follow up with her MD.  Langston Masker, PA 01/01/12 1656

## 2012-01-01 NOTE — ED Notes (Signed)
Last entry wrong pt, correct weight is 241#

## 2012-01-02 NOTE — ED Provider Notes (Signed)
Medical screening examination/treatment/procedure(s) were performed by non-physician practitioner and as supervising physician I was immediately available for consultation/collaboration.  Cyndra Numbers, MD 01/02/12 [phone]

## 2012-12-30 ENCOUNTER — Telehealth (INDEPENDENT_AMBULATORY_CARE_PROVIDER_SITE_OTHER): Payer: Self-pay | Admitting: Surgery

## 2012-12-30 NOTE — Telephone Encounter (Signed)
11/24/12 mailed recall letter for bariatric surgery follow-up to pt. Advised pt to call [facility] at [phone] to °schedule appt. (lss) ° °

## 2013-01-24 DIAGNOSIS — G373 Acute transverse myelitis in demyelinating disease of central nervous system: Secondary | ICD-10-CM | POA: Insufficient documentation

## 2013-01-24 DIAGNOSIS — G629 Polyneuropathy, unspecified: Secondary | ICD-10-CM | POA: Insufficient documentation

## 2013-06-06 ENCOUNTER — Ambulatory Visit (INDEPENDENT_AMBULATORY_CARE_PROVIDER_SITE_OTHER): Payer: 59 | Admitting: Family Medicine

## 2013-06-06 ENCOUNTER — Ambulatory Visit (HOSPITAL_BASED_OUTPATIENT_CLINIC_OR_DEPARTMENT_OTHER)
Admission: RE | Admit: 2013-06-06 | Discharge: 2013-06-06 | Disposition: A | Discharge status: Home / Self Care | Payer: 59 | Source: Ambulatory Visit | Attending: Family Medicine | Admitting: Family Medicine

## 2013-06-06 ENCOUNTER — Encounter: Payer: Self-pay | Admitting: Family Medicine

## 2013-06-06 ENCOUNTER — Telehealth: Payer: Self-pay

## 2013-06-06 VITALS — BP 118/82 | HR 114 | Temp 98.6°F | Ht 65.0 in | Wt 246.2 lb

## 2013-06-06 DIAGNOSIS — F411 Generalized anxiety disorder: Secondary | ICD-10-CM

## 2013-06-06 DIAGNOSIS — R200 Anesthesia of skin: Secondary | ICD-10-CM

## 2013-06-06 DIAGNOSIS — F17200 Nicotine dependence, unspecified, uncomplicated: Secondary | ICD-10-CM | POA: Insufficient documentation

## 2013-06-06 DIAGNOSIS — N390 Urinary tract infection, site not specified: Secondary | ICD-10-CM

## 2013-06-06 DIAGNOSIS — R0602 Shortness of breath: Secondary | ICD-10-CM | POA: Insufficient documentation

## 2013-06-06 DIAGNOSIS — R209 Unspecified disturbances of skin sensation: Secondary | ICD-10-CM

## 2013-06-06 DIAGNOSIS — I10 Essential (primary) hypertension: Secondary | ICD-10-CM | POA: Insufficient documentation

## 2013-06-06 DIAGNOSIS — R635 Abnormal weight gain: Secondary | ICD-10-CM

## 2013-06-06 LAB — POCT URINALYSIS DIPSTICK
Glucose, UA: NEGATIVE
Ketones, UA: NEGATIVE
Spec Grav, UA: 1.025

## 2013-06-06 LAB — HEPATIC FUNCTION PANEL
AST: 127 U/L — ABNORMAL HIGH (ref 0–37)
Alkaline Phosphatase: 95 U/L (ref 39–117)
Bilirubin, Direct: 0.1 mg/dL (ref 0.0–0.3)
Total Protein: 7.7 g/dL (ref 6.0–8.3)

## 2013-06-06 LAB — BASIC METABOLIC PANEL
CO2: 28 mEq/L (ref 19–32)
Calcium: 9 mg/dL (ref 8.4–10.5)
Creatinine, Ser: 0.8 mg/dL (ref 0.4–1.2)
GFR: 79.05 mL/min (ref >60.00)
Sodium: 134 mEq/L — ABNORMAL LOW (ref 135–145)

## 2013-06-06 LAB — CBC WITH DIFFERENTIAL/PLATELET
Basophils Absolute: 0 10*3/uL (ref 0.0–0.1)
Basophils Relative: 0.4 % (ref 0.0–3.0)
Eosinophils Absolute: 0.1 10*3/uL (ref 0.0–0.7)
Hemoglobin: 16 g/dL — ABNORMAL HIGH (ref 12.0–15.0)
Lymphocytes Relative: 20.6 % (ref 12.0–46.0)
MCHC: 34 g/dL (ref 30.0–36.0)
Monocytes Relative: 4 % (ref 3.0–12.0)
Neutro Abs: 6 10*3/uL (ref 1.4–7.7)
Neutrophils Relative %: 73.2 % (ref 43.0–77.0)
RBC: 4.89 Mil/uL (ref 3.87–5.11)

## 2013-06-06 LAB — T4, FREE: Free T4: 0.75 ng/dL (ref 0.60–1.60)

## 2013-06-06 LAB — FERRITIN: Ferritin: 254.7 ng/mL (ref 10.0–291.0)

## 2013-06-06 LAB — T3, FREE: T3, Free: 3.1 pg/mL (ref 2.3–4.2)

## 2013-06-06 MED ORDER — GABAPENTIN 600 MG PO TABS: 600.0000 mg | ORAL_TABLET | Freq: Every day | ORAL | Status: DC

## 2013-06-06 NOTE — Assessment & Plan Note (Signed)
Etiology unknown-- pt sees neuro at [facility] We will check some labs I agree with neuro that I do believe there is an anxiety component Pt sees psych-- we will get her into counseling as well con't current meds

## 2013-06-06 NOTE — Progress Notes (Signed)
  Subjective:    Patient ID: [name], female    DOB: 1966-07-20, 47 y.o.   MRN: [id number]  HPI Pt here to establish --- c/o numbness and tingling in feet and hands x few years.  It started when when she fell and broke her feet. And has been progressing since then.  She has seen 2 neuro--most recent [facility].  Pt also c/o breathing problems.  She feels like she can't catch her breath. She does see a psychiatrist but does not see a Veterinary surgeon.  Her son is in prison for drugs and her family members were the victims that died in boating accident at [place].   Review of Systems As above     Objective:   Physical Exam  BP 118/82  Pulse 114  Temp(Src) 98.6 F (37 C) (Oral)  Ht 5\' 5"  (1.651 m)  Wt 246 lb 3.2 oz (111.676 kg)  BMI 40.97 kg/m2  SpO2 97% General appearance: alert, cooperative, appears stated age and mild distress Head: Normocephalic, without obvious abnormality, atraumatic Eyes: conjunctivae/corneas clear. PERRL, EOM's intact. Fundi benign. Ears: normal TM's and external ear canals both ears Nose: Nares normal. Septum midline. Mucosa normal. No drainage or sinus tenderness. Throat: lips, mucosa, and tongue normal; teeth and gums normal Neck: no adenopathy, no carotid bruit, supple, symmetrical, trachea midline and thyroid not enlarged, symmetric, no tenderness/mass/nodules Lungs: clear to auscultation bilaterally Heart: S1, S2 normal Extremities: extremities normal, atraumatic, no cyanosis or edema Skin: Skin color, texture, turgor normal. No rashes or lesions Lymph nodes: Cervical, supraclavicular, and axillary nodes normal. Neurologic: Alert and oriented X 3, normal strength and tone. Normal symmetric reflexes. Normal coordination and gait Psych--  Pt is very anxious,  Easily cries when talking about things mentioned above     Assessment & Plan:

## 2013-06-06 NOTE — Assessment & Plan Note (Signed)
cxr  ekg --tachy but pt used proair this am Otherwise normal PFT-- unable to save or print--- done 2x--- 1 mild  2.  Moderate restriction dulera inh 100  2 puffs bid proair prn rto 1 month or sooner prn

## 2013-06-06 NOTE — Telephone Encounter (Signed)
Message copied by Arnette Norris on Mon Jun 06, 2013  4:37 PM ------      Message from: Lelon Perla      Created: Mon Jun 06, 2013  2:10 PM       Thyroid normal      LFT elevated---any inc tylenol or etoh?  -----recheck hep , ggt, acute hep and [place] abd ------

## 2013-06-06 NOTE — Telephone Encounter (Signed)
Spoke with patient and she voiced understanding and agreed to have all labs and US done.  Orders put in.   KP

## 2013-06-06 NOTE — Assessment & Plan Note (Signed)
S/p lap band Pt needs f/u bariatric surgeon She has gained weight over the last several months--100 lbs and is overdue for f/u with [facility]

## 2013-06-06 NOTE — Patient Instructions (Addendum)
Peripheral Neuropathy Peripheral neuropathy is a common disorder of your nerves resulting from damage. CAUSES  This disorder may be caused by a disease of the nerves or illness. Many neuropathies have well known causes such as:  Diabetes. This is one of the most common causes.   Uremia.   AIDS.   Nutritional deficiencies.   Other causes include mechanical pressures. These may be from:   Compression.   Injury.   Contusions or bruises.   Fracture or dislocated bones.   Pressure involving the nerves close to the surface. Nerves such as the ulnar, or radial can be injured by prolonged use of crutches.  Other injuries may come from:  Tumor.   Hemorrhage or bleeding into a nerve.   Exposure to cold or radiation.   Certain medicines or toxic substances (rare).   Vascular or collagen disorders such as:   Atherosclerosis.   Systemic lupus erythematosus.   Scleroderma.   Sarcoidosis.   Rheumatoid arthritis.   Polyarteritis nodosa.   A large number of cases are of unknown cause.  SYMPTOMS  Common problems include:  Weakness.   Numbness.   Abnormal sensations (paresthesia) such as:   Burning.   Tickling.   Pricking.   Tingling.   Pain in the arms, hands, legs and/or feet.  TREATMENT  Therapy for this disorder differs depending on the cause. It may vary from medical treatment with medications or physical therapy among others.   For example, therapy for this disorder caused by diabetes involves control of the diabetes.   In cases where a tumor or ruptured disc is the cause, therapy may involve surgery. This would be to remove the tumor or to repair the ruptured disc.   In entrapment or compression neuropathy, treatment may consist of splinting or surgical decompression of the ulnar or median nerves. A common example of entrapment neuropathy is carpal tunnel syndrome. This has become more common because of the increasing use of computers.   Peroneal and  radial compression neuropathies may require avoidance of pressure.   Physical therapy and/or splints may be useful in preventing contractures. This is a condition in which shortened muscles around joints cause abnormal and sometimes painful positioning of the joints.  Document Released: 12/05/2002 Document Revised: 08/27/2011 Document Reviewed: 12/15/2005 [employer] Patient Information 2012 [employer]. 

## 2013-06-06 NOTE — Addendum Note (Signed)
Addended by: Silvio Pate D on: 06/06/2013 04:47 PM   Modules accepted: Orders

## 2013-06-07 ENCOUNTER — Other Ambulatory Visit (INDEPENDENT_AMBULATORY_CARE_PROVIDER_SITE_OTHER): Payer: 59

## 2013-06-07 ENCOUNTER — Ambulatory Visit (HOSPITAL_BASED_OUTPATIENT_CLINIC_OR_DEPARTMENT_OTHER): Payer: 59

## 2013-06-07 DIAGNOSIS — R7989 Other specified abnormal findings of blood chemistry: Secondary | ICD-10-CM

## 2013-06-07 LAB — HEPATIC FUNCTION PANEL
AST: 100 U/L — ABNORMAL HIGH (ref 0–37)
Albumin: 3.2 g/dL — ABNORMAL LOW (ref 3.5–5.2)
Alkaline Phosphatase: 85 U/L (ref 39–117)
Bilirubin, Direct: 0.2 mg/dL (ref 0.0–0.3)

## 2013-06-08 ENCOUNTER — Ambulatory Visit (HOSPITAL_BASED_OUTPATIENT_CLINIC_OR_DEPARTMENT_OTHER)
Admission: RE | Admit: 2013-06-08 | Discharge: 2013-06-08 | Disposition: A | Discharge status: Home / Self Care | Payer: 59 | Source: Ambulatory Visit | Attending: Family Medicine | Admitting: Family Medicine

## 2013-06-08 ENCOUNTER — Encounter: Payer: Self-pay | Admitting: General Practice

## 2013-06-08 DIAGNOSIS — Z9884 Bariatric surgery status: Secondary | ICD-10-CM | POA: Insufficient documentation

## 2013-06-08 DIAGNOSIS — Z9089 Acquired absence of other organs: Secondary | ICD-10-CM | POA: Insufficient documentation

## 2013-06-08 DIAGNOSIS — K7689 Other specified diseases of liver: Secondary | ICD-10-CM | POA: Insufficient documentation

## 2013-06-08 DIAGNOSIS — Z87442 Personal history of urinary calculi: Secondary | ICD-10-CM | POA: Insufficient documentation

## 2013-06-08 DIAGNOSIS — I10 Essential (primary) hypertension: Secondary | ICD-10-CM | POA: Insufficient documentation

## 2013-06-08 DIAGNOSIS — Z90710 Acquired absence of both cervix and uterus: Secondary | ICD-10-CM | POA: Insufficient documentation

## 2013-06-08 DIAGNOSIS — R7989 Other specified abnormal findings of blood chemistry: Secondary | ICD-10-CM | POA: Insufficient documentation

## 2013-06-08 DIAGNOSIS — R1012 Left upper quadrant pain: Secondary | ICD-10-CM | POA: Insufficient documentation

## 2013-06-08 LAB — HEPATITIS PANEL, ACUTE
HCV Ab: NEGATIVE
Hep A IgM: NEGATIVE

## 2013-06-09 LAB — URINE CULTURE

## 2013-06-10 ENCOUNTER — Other Ambulatory Visit: Payer: Self-pay

## 2013-06-10 MED ORDER — CIPROFLOXACIN HCL 500 MG PO TABS: 500.0000 mg | ORAL_TABLET | Freq: Two times a day (BID) | ORAL | Status: DC

## 2013-06-11 LAB — VITAMIN D 1,25 DIHYDROXY: Vitamin D 1, 25 (OH)2 Total: 52 pg/mL (ref 18–72)

## 2013-06-28 ENCOUNTER — Ambulatory Visit (INDEPENDENT_AMBULATORY_CARE_PROVIDER_SITE_OTHER): Payer: 59 | Admitting: Licensed Clinical Social Worker

## 2013-06-28 DIAGNOSIS — F449 Dissociative and conversion disorder, unspecified: Secondary | ICD-10-CM

## 2013-06-30 ENCOUNTER — Encounter (INDEPENDENT_AMBULATORY_CARE_PROVIDER_SITE_OTHER): Payer: Self-pay | Admitting: Surgery

## 2013-06-30 ENCOUNTER — Ambulatory Visit (INDEPENDENT_AMBULATORY_CARE_PROVIDER_SITE_OTHER): Payer: 59 | Admitting: Surgery

## 2013-06-30 ENCOUNTER — Other Ambulatory Visit (INDEPENDENT_AMBULATORY_CARE_PROVIDER_SITE_OTHER): Payer: Self-pay

## 2013-06-30 DIAGNOSIS — I10 Essential (primary) hypertension: Secondary | ICD-10-CM

## 2013-06-30 DIAGNOSIS — Z6841 Body Mass Index (BMI) 40.0 and over, adult: Secondary | ICD-10-CM

## 2013-06-30 NOTE — Progress Notes (Addendum)
[facility]  Ovidio Kin, MD,  FACS [address]  [address] [place], [place]    [address] Phone:  [phone] FAX:  [phone]   Re:   BRONWYN BELASCO DOB:   Apr 29, 1966 MRN:   [id number]  ASSESSMENT AND PLAN: 1.  Lap band - Dr. Colin Benton - Sept 2005  Original weight - Weight - 259, BMI - 44.3  Last seen by me 06/06/2011.  She actually has resistance in the morning.  She is not vomiting.  She has a lot going on in her life which make consistent diet behavior difficult.  I think she should start with a nutritional consult to review the diet with her.  She says that she has cut out sugars.  I'll see her back in 3 to 4 months to reassess her.  2.  Vague neurologic problems  Sees Dr. Orie Rout at [facility]. 3.  History of panic attacks  Anxiety  Sees Velta Addison (with Andee Poles) and Berniece Andreas (Clinical Social Worker for [employer]). 4.  Low B12   On B12 shots 5.  Hypertension 6.  Seeing Dr. Assunta Found for thyroid problems. 7.  History of teeth falling out.   Etiology unclear. 8.  Pulmonary issues, possible asthma  Seeing Dr. Laury Axon for this. 9.  Husband, Sahira Cataldi, is a lap band patient of our practice.  HISTORY OF PRESENT ILLNESS: Chief Complaint  Patient presents with  . Lap Band Fill    MAUREEN DELATTE is a 47 y.o. (DOB: 20-Jun-1966)  white  female who is a patient of Loreen Freud, DO and comes to me today for lap band follow up. She gives a rambling history and it is hard for her to stay focused on her history.  Her major complaint is a neurologic feeling that half of her body is numb.  She says that her whole left side feels different at times and is numb.  He second complaint is of both feet "burning".  She has tried gabapentin, but this has produced only modest benefit.  She has a host of lesser problems.  It is hard to see how any of these are related to her lap band.  She has been to [facility] and seen Dr. Orie Rout (a neurologist) who is  working with her.  She said that he did nerve conduction studies and an MRI in 2013.  These test were negative.  She is only down 15 pounds from her pre op weight, but she is down 5 pounds from when I last saw her.  Her other complaints are:  1) Elevated LFT's, attributed to a fatty liver.  2) Trouble with her lungs that she thinks is asthma. She is on a new inhaler prescribed by Dr. Laury Axon, Elwin Sleight, and she thinks that this has helped. 3) she saw a heart doctor for something. 4) she said that her teeth are falling out.  Current Outpatient Prescriptions  Medication Sig Dispense Refill  . albuterol (PROAIR HFA) 108 (90 BASE) MCG/ACT inhaler Inhale 2 puffs into the lungs every 6 (six) hours as needed.        . ALPRAZolam (XANAX) 0.5 MG tablet Take 0.5 mg by mouth 4 (four) times daily as needed.        . ciprofloxacin (CIPRO) 500 MG tablet Take 1 tablet (500 mg total) by mouth 2 (two) times daily.  10 tablet  0  . cyanocobalamin (,VITAMIN B-12,) 1000 MCG/ML injection Inject 1,000 mcg into the muscle every  30 (thirty) days.       Marland Kitchen gabapentin (NEURONTIN) 600 MG tablet Take 1 tablet (600 mg total) by mouth daily.      . propranolol ER (INDERAL LA) 60 MG 24 hr capsule Take 1 capsule by mouth daily.      Marland Kitchen VYVANSE 60 MG capsule Take 1 capsule by mouth daily.      Marland Kitchen zolpidem (AMBIEN) 10 MG tablet Take 10 mg by mouth at bedtime as needed.         No current facility-administered medications for this visit.   ROS: Neuro:  Is seen at [facility] by Dr. Orie Rout, neurologist, for her left sided numbness. Pulmonary:  Dr. Laury Axon has switched her meds around.  She wonders whether she has asthma. Psych:  Paulla Dolly and Berniece Andreas. She saw Dr. Vernona Rieger originally for her clearance in June 2005.  Social History: Husband, Rochel Privett, is a lap band patient. 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  HEENT: Normal. Pupils equal. She complains that she is having trouble with her teeth falling out. Neck: Supple. No mass.  No thyroid mass.  Carotid pulse okay with no bruit. Lymph Nodes:  No supraclavicular or cervical nodes. Lungs: Clear to auscultation and symmetric breath sounds. Heart:  RRR. No murmur or rub. Abdomen: Soft. No mass. No tenderness. No hernia. Normal bowel sounds.  The band port is to the right of her umbilicus.  It is hard to feel.  DATA REVIEWED: Epic notes.  I have no data from [facility].   Ovidio Kin, MD, FACS Office:  [phone]

## 2013-07-04 ENCOUNTER — Ambulatory Visit (INDEPENDENT_AMBULATORY_CARE_PROVIDER_SITE_OTHER): Payer: 59 | Admitting: Family Medicine

## 2013-07-04 ENCOUNTER — Encounter: Payer: Self-pay | Admitting: Family Medicine

## 2013-07-04 VITALS — BP 120/84 | HR 98 | Temp 98.6°F | Wt 238.2 lb

## 2013-07-04 DIAGNOSIS — R739 Hyperglycemia, unspecified: Secondary | ICD-10-CM

## 2013-07-04 DIAGNOSIS — R7309 Other abnormal glucose: Secondary | ICD-10-CM

## 2013-07-04 NOTE — Assessment & Plan Note (Signed)
Check labs Glucometer and diabetic teaching done by RN

## 2013-07-04 NOTE — Progress Notes (Signed)
  Subjective:    Patient ID: [name], female    DOB: September 23, 1966, 47 y.o.   MRN: [id number]  HPI Pt here because the numbness in her feet was getting worse and a family member checked her glucose and it was high. She said she also just didn't feel well.    Review of Systems As above    Objective:   Physical Exam  BP 120/84  Pulse 98  Temp(Src) 98.6 F (37 C) (Oral)  Wt 238 lb 3.2 oz (108.047 kg)  BMI 39.64 kg/m2  SpO2 98% General appearance: alert, cooperative, appears stated age and no distress Neck: no adenopathy, no carotid bruit, no JVD, supple, symmetrical, trachea midline and thyroid not enlarged, symmetric, no tenderness/mass/nodules Lungs: clear to auscultation bilaterally Heart: S1, S2 normal Extremities: extremities normal, atraumatic, no cyanosis or edema Sensory exam of the foot is normal, tested with the monofilament. Good pulses, no lesions or ulcers, good peripheral pulses.        Assessment & Plan:

## 2013-07-04 NOTE — Patient Instructions (Addendum)

## 2013-07-05 ENCOUNTER — Other Ambulatory Visit: Payer: Self-pay | Admitting: Family Medicine

## 2013-07-05 ENCOUNTER — Telehealth: Payer: Self-pay | Admitting: *Deleted

## 2013-07-05 ENCOUNTER — Encounter: Payer: Self-pay | Admitting: Family Medicine

## 2013-07-05 DIAGNOSIS — J45909 Unspecified asthma, uncomplicated: Secondary | ICD-10-CM

## 2013-07-05 LAB — BASIC METABOLIC PANEL
BUN: 5 mg/dL — ABNORMAL LOW (ref 6–23)
Chloride: 105 mEq/L (ref 96–112)
Potassium: 4.1 mEq/L (ref 3.5–5.1)

## 2013-07-05 LAB — MICROALBUMIN / CREATININE URINE RATIO
Creatinine,U: 244.2 mg/dL
Microalb Creat Ratio: 0.4 mg/g (ref 0.0–30.0)
Microalb, Ur: 1 mg/dL (ref 0.0–1.9)

## 2013-07-05 LAB — HEPATIC FUNCTION PANEL
AST: 68 U/L — ABNORMAL HIGH (ref 0–37)
Alkaline Phosphatase: 95 U/L (ref 39–117)
Total Bilirubin: 0.9 mg/dL (ref 0.3–1.2)

## 2013-07-05 LAB — POCT URINALYSIS DIPSTICK
Blood, UA: NEGATIVE
Nitrite, UA: NEGATIVE
Urobilinogen, UA: 0.2
pH, UA: 6

## 2013-07-05 LAB — LIPID PANEL
Total CHOL/HDL Ratio: 4
VLDL: 40.8 mg/dL — ABNORMAL HIGH (ref 0.0–40.0)

## 2013-07-05 LAB — HEMOGLOBIN A1C: Hgb A1c MFr Bld: 7.2 % — ABNORMAL HIGH (ref 4.6–6.5)

## 2013-07-05 MED ORDER — MOMETASONE FURO-FORMOTEROL FUM 100-5 MCG/ACT IN AERO: 2.0000 | INHALATION_SPRAY | Freq: Two times a day (BID) | RESPIRATORY_TRACT | Status: DC

## 2013-07-05 NOTE — Telephone Encounter (Signed)
Pt left VM that she was given sample of inhaler but she is now out and would like to get Rx sent in to pharmacy Pt seen on 06-06-13 and per noted was given dulera inh 100 2 puffs bid.Please advise

## 2013-07-06 ENCOUNTER — Other Ambulatory Visit: Payer: Self-pay

## 2013-07-06 MED ORDER — METFORMIN HCL ER 500 MG PO TB24: 500.0000 mg | ORAL_TABLET | Freq: Every evening | ORAL | Status: DC

## 2013-07-06 MED ORDER — ATORVASTATIN CALCIUM 10 MG PO TABS: 10.0000 mg | ORAL_TABLET | Freq: Every day | ORAL | Status: DC

## 2013-07-06 MED ORDER — MOMETASONE FURO-FORMOTEROL FUM 100-5 MCG/ACT IN AERO: 2.0000 | INHALATION_SPRAY | Freq: Two times a day (BID) | RESPIRATORY_TRACT | Status: DC

## 2013-07-07 NOTE — Telephone Encounter (Signed)
See my chart message

## 2013-07-14 ENCOUNTER — Ambulatory Visit (INDEPENDENT_AMBULATORY_CARE_PROVIDER_SITE_OTHER): Payer: 59 | Admitting: Licensed Clinical Social Worker

## 2013-07-14 DIAGNOSIS — F449 Dissociative and conversion disorder, unspecified: Secondary | ICD-10-CM

## 2013-07-18 ENCOUNTER — Encounter: Payer: Self-pay | Admitting: Family Medicine

## 2013-07-28 ENCOUNTER — Telehealth: Payer: Self-pay | Admitting: Family Medicine

## 2013-07-28 ENCOUNTER — Ambulatory Visit (INDEPENDENT_AMBULATORY_CARE_PROVIDER_SITE_OTHER): Payer: 59 | Admitting: Licensed Clinical Social Worker

## 2013-07-28 DIAGNOSIS — F449 Dissociative and conversion disorder, unspecified: Secondary | ICD-10-CM

## 2013-07-28 NOTE — Telephone Encounter (Signed)
Patient is requesting samples of proair inhaler. CB# [phone]

## 2013-08-01 NOTE — Telephone Encounter (Signed)
Ok for sample if available

## 2013-08-01 NOTE — Telephone Encounter (Signed)
VM left advising sample left at check in.       KP

## 2013-08-01 NOTE — Telephone Encounter (Signed)
Please advise      KP 

## 2013-08-04 ENCOUNTER — Encounter: Payer: Self-pay | Admitting: Family Medicine

## 2013-08-04 ENCOUNTER — Ambulatory Visit (INDEPENDENT_AMBULATORY_CARE_PROVIDER_SITE_OTHER): Payer: 59 | Admitting: Family Medicine

## 2013-08-04 VITALS — BP 120/70 | HR 83 | Temp 98.5°F | Wt 240.6 lb

## 2013-08-04 DIAGNOSIS — F419 Anxiety disorder, unspecified: Secondary | ICD-10-CM

## 2013-08-04 DIAGNOSIS — R5381 Other malaise: Secondary | ICD-10-CM

## 2013-08-04 DIAGNOSIS — E1169 Type 2 diabetes mellitus with other specified complication: Secondary | ICD-10-CM

## 2013-08-04 DIAGNOSIS — R5383 Other fatigue: Secondary | ICD-10-CM

## 2013-08-04 DIAGNOSIS — N390 Urinary tract infection, site not specified: Secondary | ICD-10-CM

## 2013-08-04 DIAGNOSIS — F411 Generalized anxiety disorder: Secondary | ICD-10-CM

## 2013-08-04 DIAGNOSIS — E1165 Type 2 diabetes mellitus with hyperglycemia: Secondary | ICD-10-CM

## 2013-08-04 DIAGNOSIS — E118 Type 2 diabetes mellitus with unspecified complications: Secondary | ICD-10-CM

## 2013-08-04 LAB — POCT URINALYSIS DIPSTICK
Ketones, UA: NEGATIVE
Spec Grav, UA: >=1.03
pH, UA: 6

## 2013-08-04 MED ORDER — DULOXETINE HCL 60 MG PO CPEP: ORAL_CAPSULE | ORAL | Status: DC

## 2013-08-04 MED ORDER — METFORMIN HCL ER 500 MG PO TB24: 1000.0000 mg | ORAL_TABLET | Freq: Every evening | ORAL | Status: DC

## 2013-08-04 MED ORDER — DULOXETINE HCL 30 MG PO CPEP
60.0000 mg | ORAL_CAPSULE | Freq: Every day | ORAL | Status: DC
Start: 2013-08-04 — End: 2013-08-04

## 2013-08-04 NOTE — Progress Notes (Signed)
  Subjective:     [name] is a 47 y.o. female who presents for follow up of diabetes.. Current symptoms include: hyperglycemia, polydipsia, polyuria and visual disturbances. Patient denies foot ulcerations, hypoglycemia  and nausea. Evaluation to date has been: fasting blood sugar, fasting lipid panel, hemoglobin A1C and microalbuminuria. Home sugars: symptomatic hypoglycemia does not occur, but symptomatic hyperglycemia does. Current treatments: more intensive attention to diet which has been somewhat effective and Continued metformin which has been effective. Last dilated eye exam due.  The following portions of the patient's history were reviewed and updated as appropriate: allergies, current medications, past family history, past medical history, past social history, past surgical history and problem list.  Review of Systems Pertinent items are noted in HPI.    Objective:    BP 120/70  Pulse 83  Temp(Src) 98.5 F (36.9 C) (Oral)  Wt 240 lb 9.6 oz (109.135 kg)  BMI 40.04 kg/m2  SpO2 97% General appearance: alert, cooperative, appears stated age and no distress Lungs: clear to auscultation bilaterally Heart: S1, S2 normal    @DMFOOTEXAM @  Patient was not evaluated for proper footwear and sizing.  Laboratory: No components found with this basename: A1C      Assessment:    Diabetes mellitus Type II, under fair control.    Plan:    Counseling at today's visit: discussed the need for weight loss, focused on the need for regular aerobic exercise and focused on the need to adhere to the prescribed ADA diet. Agricultural engineer distributed. Discussed foot care. Reminded to get yearly retinal exam. Continued metformin; see  medication orders. Labs: fasting blood sugar and fasting lipid panel. Reminded to bring in blood sugar diary at next visit. Follow up in 3 months or as needed.

## 2013-08-04 NOTE — Patient Instructions (Addendum)
Diabetes and Standards of Medical Care  Diabetes is complicated. You may find that your diabetes team includes a dietitian, nurse, diabetes educator, eye doctor, and more. To help everyone know what is going on and to help you get the care you deserve, the following schedule of care was developed to help keep you on track. Below are the tests, exams, vaccines, medicines, education, and plans you will need. A1c test  Performed at least 2 times a year if you are meeting treatment goals.  Performed 4 times a year if therapy has changed or if you are not meeting treatment goals. Blood pressure test  Performed at every routine medical visit. The goal is less than 120/80 mmHg. Dental exam  Follow up with the dentist regularly. Eye exam  Diagnosed with type 1 diabetes as a child: Get an exam upon reaching the age of 10 years or older and having had diabetes for 3 5 years. Yearly eye exams are recommended after that initial eye exam.  Diagnosed with type 1 diabetes as an adult: Get an exam within 5 years of diagnosis and then yearly.  Diagnosed with type 2 diabetes: Get an exam as soon as possible after the diagnosis and then yearly. Foot care exam  Visual foot exams are performed at every routine medical visit. The exams check for cuts, injuries, or other problems with the feet.  A comprehensive foot exam should be done yearly. This includes visual inspection as well as assessing foot pulses and testing for loss of sensation. Kidney function test (urine microalbumin)  Performed once a year.  Type 1 diabetes: The first test is performed 5 years after diagnosis.  Type 2 diabetes: The first test is performed at the time of diagnosis.  A serum creatinine and estimated glomerular filtration rate (eGFR) test is done once a year to tell the level of chronic kidney disease (CKD), if present. Lipid profile (Cholesterol, HDL, LDL, Triglycerides)  Performed every 5 years for most people.  The  goal for LDL is less than 100 mg/dl. If at high risk, the goal is less than 70 mg/dl.  The goal for HDL is 40 mg/dl 50 mg/dl for men and 50 mg/dl 60 mg/dl for women. An HDL cholesterol of 60 mg/dL or higher gives some protection against heart disease.  The goal for triglycerides is less than 150 mg/dl. Influenza vaccine, pneumococcal vaccine, and hepatitis B vaccine  The influenza vaccine is recommended yearly.  The pneumococcal vaccine is generally given once in a lifetime. However, there are some instances when another vaccination is recommended. Check with your caregiver.  The hepatitis B vaccine is also recommended for adults with diabetes. Diabetes self-management education  Recommended at diagnosis and ongoing as needed. Treatment plan  Reviewed at every medical visit. Document Released: 10/12/2009 Document Revised: 12/01/2012 Document Reviewed: 06/17/2011 [employer] Patient Information 2014 [employer].  

## 2013-08-05 LAB — CBC WITH DIFFERENTIAL/PLATELET
Basophils Absolute: 0 K/uL (ref 0.0–0.1)
Basophils Relative: 0.6 % (ref 0.0–3.0)
Eosinophils Absolute: 0.2 K/uL (ref 0.0–0.7)
Eosinophils Relative: 2.3 % (ref 0.0–5.0)
HCT: 47.4 % — ABNORMAL HIGH (ref 36.0–46.0)
Hemoglobin: 15.7 g/dL — ABNORMAL HIGH (ref 12.0–15.0)
Lymphocytes Relative: 19.3 % (ref 12.0–46.0)
Lymphs Abs: 1.4 K/uL (ref 0.7–4.0)
MCHC: 33.2 g/dL (ref 30.0–36.0)
MCV: 95.4 fl (ref 78.0–100.0)
Monocytes Absolute: 0.3 K/uL (ref 0.1–1.0)
Monocytes Relative: 3.7 % (ref 3.0–12.0)
Neutro Abs: 5.3 K/uL (ref 1.4–7.7)
Neutrophils Relative %: 74.1 % (ref 43.0–77.0)
Platelets: 225 K/uL (ref 150.0–400.0)
RBC: 4.96 Mil/uL (ref 3.87–5.11)
RDW: 13.4 % (ref 11.5–14.6)
WBC: 7.2 K/uL (ref 4.5–10.5)

## 2013-08-05 LAB — BASIC METABOLIC PANEL WITH GFR
BUN: 6 mg/dL (ref 6–23)
CO2: 28 meq/L (ref 19–32)
Calcium: 9.9 mg/dL (ref 8.4–10.5)
Chloride: 100 meq/L (ref 96–112)
Creatinine, Ser: 0.9 mg/dL (ref 0.4–1.2)
GFR: 75.79 mL/min
Glucose, Bld: 102 mg/dL — ABNORMAL HIGH (ref 70–99)
Potassium: 4 meq/L (ref 3.5–5.1)
Sodium: 137 meq/L (ref 135–145)

## 2013-08-05 LAB — TSH: TSH: 2.12 u[IU]/mL (ref 0.35–5.50)

## 2013-08-06 LAB — URINE CULTURE: Colony Count: >=100000

## 2013-08-07 ENCOUNTER — Other Ambulatory Visit: Payer: Self-pay | Admitting: Family Medicine

## 2013-08-07 DIAGNOSIS — N390 Urinary tract infection, site not specified: Secondary | ICD-10-CM

## 2013-08-07 MED ORDER — CIPROFLOXACIN HCL 500 MG PO TABS: 500.0000 mg | ORAL_TABLET | Freq: Two times a day (BID) | ORAL | Status: DC

## 2013-08-08 LAB — VITAMIN D 1,25 DIHYDROXY: Vitamin D 1, 25 (OH)2 Total: 44 pg/mL (ref 18–72)

## 2013-08-11 ENCOUNTER — Ambulatory Visit (INDEPENDENT_AMBULATORY_CARE_PROVIDER_SITE_OTHER): Payer: 59 | Admitting: Licensed Clinical Social Worker

## 2013-08-11 DIAGNOSIS — F449 Dissociative and conversion disorder, unspecified: Secondary | ICD-10-CM

## 2013-08-30 ENCOUNTER — Ambulatory Visit (INDEPENDENT_AMBULATORY_CARE_PROVIDER_SITE_OTHER): Payer: 59 | Admitting: Licensed Clinical Social Worker

## 2013-08-30 DIAGNOSIS — F449 Dissociative and conversion disorder, unspecified: Secondary | ICD-10-CM

## 2013-09-02 ENCOUNTER — Encounter: Payer: Self-pay | Admitting: Family Medicine

## 2013-09-02 ENCOUNTER — Ambulatory Visit (INDEPENDENT_AMBULATORY_CARE_PROVIDER_SITE_OTHER): Payer: 59 | Admitting: Family Medicine

## 2013-09-02 VITALS — BP 124/81 | HR 87 | Temp 98.7°F | Wt 236.2 lb

## 2013-09-02 DIAGNOSIS — R209 Unspecified disturbances of skin sensation: Secondary | ICD-10-CM

## 2013-09-02 DIAGNOSIS — R200 Anesthesia of skin: Secondary | ICD-10-CM

## 2013-09-02 DIAGNOSIS — R202 Paresthesia of skin: Secondary | ICD-10-CM

## 2013-09-02 DIAGNOSIS — E1165 Type 2 diabetes mellitus with hyperglycemia: Secondary | ICD-10-CM

## 2013-09-02 LAB — HEPATIC FUNCTION PANEL
ALT: 38 U/L — ABNORMAL HIGH (ref 0–35)
AST: 39 U/L — ABNORMAL HIGH (ref 0–37)
Bilirubin, Direct: 0.2 mg/dL (ref 0.0–0.3)
Total Protein: 7.1 g/dL (ref 6.0–8.3)

## 2013-09-02 LAB — BASIC METABOLIC PANEL
BUN: 7 mg/dL (ref 6–23)
CO2: 25 mEq/L (ref 19–32)
Chloride: 102 mEq/L (ref 96–112)
Creat: 0.84 mg/dL (ref 0.50–1.10)
Potassium: 4.5 mEq/L (ref 3.5–5.3)

## 2013-09-02 LAB — CBC WITH DIFFERENTIAL/PLATELET
Basophils Absolute: 0 10*3/uL (ref 0.0–0.1)
Basophils Relative: 0 % (ref 0–1)
Eosinophils Absolute: 0.1 10*3/uL (ref 0.0–0.7)
Eosinophils Relative: 1 % (ref 0–5)
HCT: 44.5 % (ref 36.0–46.0)
Hemoglobin: 15.4 g/dL — ABNORMAL HIGH (ref 12.0–15.0)
MCH: 31.4 pg (ref 26.0–34.0)
MCHC: 34.6 g/dL (ref 30.0–36.0)
MCV: 90.8 fL (ref 78.0–100.0)
Monocytes Absolute: 0.3 10*3/uL (ref 0.1–1.0)
Monocytes Relative: 4 % (ref 3–12)
RDW: 13.6 % (ref 11.5–15.5)

## 2013-09-02 LAB — TSH: TSH: 1.244 u[IU]/mL (ref 0.350–4.500)

## 2013-09-02 MED ORDER — METFORMIN HCL ER 500 MG PO TB24: ORAL_TABLET | ORAL | Status: DC

## 2013-09-02 NOTE — Progress Notes (Signed)
  Subjective:    Patient ID: [name], female    DOB: 01/13/1966, 47 y.o.   MRN: [id number]  HPI Pt here c/o L arm numbness and numbness spread from L side face down to L leg and toes.  Lasted all day and was gone the next am.   Pt also describes a glazed feeling in eyes.  It occurred in car one time and this has occurred several times.  No cp or sob. Pt has a neurologist and has had NCS.  They have been unable to find reason for numbness.   Review of Systems As above    Objective:   Physical Exam  BP 124/81  Pulse 87  Temp(Src) 98.7 F (37.1 C) (Oral)  Wt 236 lb 3.2 oz (107.14 kg)  BMI 39.31 kg/m2  SpO2 97% General appearance: alert, cooperative and no distress Head: Normocephalic, without obvious abnormality, atraumatic Eyes: conjunctivae/corneas clear. PERRL, EOM's intact. Fundi benign. Ears: normal TM's and external ear canals both ears Nose: Nares normal. Septum midline. Mucosa normal. No drainage or sinus tenderness. Throat: lips, mucosa, and tongue normal; teeth and gums normal Neck: no adenopathy, no carotid bruit, supple, symmetrical, trachea midline and thyroid not enlarged, symmetric, no tenderness/mass/nodules Lungs: clear to auscultation bilaterally Heart: S1, S2 normal Extremities: extremities normal, atraumatic, no cyanosis or edema Pulses: 2+ and symmetric Skin: Skin color, texture, turgor normal. No rashes or lesions Neurologic: Alert and oriented X 3, normal strength and tone. Normal symmetric reflexes. Normal coordination and gait                     Pt has numbness mid shin to foot b/l and both arms/ hands---it comes and goes.         Assessment & Plan:

## 2013-09-02 NOTE — Patient Instructions (Addendum)
Paresthesia °Paresthesia is an abnormal burning or prickling sensation. This sensation is generally felt in the hands, arms, legs, or feet. However, it may occur in any part of the body. It is usually not painful. The feeling may be described as: °· Tingling or numbness. °· "Pins and needles." °· Skin crawling. °· Buzzing. °· Limbs "falling asleep." °· Itching. °Most people experience temporary (transient) paresthesia at some time in their lives. °CAUSES  °Paresthesia may occur when you breathe too quickly (hyperventilation). It can also occur without any apparent cause. Commonly, paresthesia occurs when pressure is placed on a nerve. The feeling quickly goes away once the pressure is removed. For some people, however, paresthesia is a long-lasting (chronic) condition caused by an underlying disorder. The underlying disorder may be: °· A traumatic, direct injury to nerves. Examples include a: °· Broken (fractured) neck. °· Fractured skull. °· A disorder affecting the brain and spinal cord (central nervous system). Examples include: °· Transverse myelitis. °· Encephalitis. °· Transient ischemic attack. °· Multiple sclerosis. °· Stroke. °· Tumor or blood vessel problems, such as an arteriovenous malformation pressing against the brain or spinal cord. °· A condition that damages the peripheral nerves (peripheral neuropathy). Peripheral nerves are not part of the brain and spinal cord. These conditions include: °· Diabetes. °· Peripheral vascular disease. °· Nerve entrapment syndromes, such as carpal tunnel syndrome. °· Shingles. °· Hypothyroidism. °· Vitamin B12 deficiencies. °· Alcoholism. °· Heavy metal poisoning (lead, arsenic). °· Rheumatoid arthritis. °· Systemic lupus erythematosus. °DIAGNOSIS  °Your caregiver will attempt to find the underlying cause of your paresthesia. Your caregiver may: °· Take your medical history. °· Perform a physical exam. °· Order various lab tests. °· Order imaging tests. °TREATMENT    °Treatment for paresthesia depends on the underlying cause. °HOME CARE INSTRUCTIONS °· Avoid drinking alcohol. °· You may consider massage or acupuncture to help relieve your symptoms. °· Keep all follow-up appointments as directed by your caregiver. °SEEK IMMEDIATE MEDICAL CARE IF:  °· You feel weak. °· You have trouble walking or moving. °· You have problems with speech or vision. °· You feel confused. °· You cannot control your bladder or bowel movements. °· You feel numbness after an injury. °· You faint. °· Your burning or prickling feeling gets worse when walking. °· You have pain, cramps, or dizziness. °· You develop a rash. °MAKE SURE YOU: °· Understand these instructions. °· Will watch your condition. °· Will get help right away if you are not doing well or get worse. °Document Released: 12/05/2002 Document Revised: 03/08/2012 Document Reviewed: 09/05/2011 °Exit[employer]® Patient Information ©2014 [employer]. ° °

## 2013-09-04 NOTE — Assessment & Plan Note (Addendum)
Check labs F/u neuro ---she has not told them that numbness has worsened and is more peristent con't gabepentin

## 2013-09-16 ENCOUNTER — Ambulatory Visit: Payer: Self-pay | Admitting: Family Medicine

## 2013-09-19 ENCOUNTER — Telehealth (INDEPENDENT_AMBULATORY_CARE_PROVIDER_SITE_OTHER): Payer: Self-pay

## 2013-09-19 ENCOUNTER — Ambulatory Visit: Payer: 59 | Admitting: Licensed Clinical Social Worker

## 2013-09-19 NOTE — Telephone Encounter (Signed)
V/M Lap band follow up appt 11/11/13@410 

## 2013-09-26 ENCOUNTER — Encounter: Payer: Self-pay | Admitting: Family Medicine

## 2013-09-26 ENCOUNTER — Ambulatory Visit (INDEPENDENT_AMBULATORY_CARE_PROVIDER_SITE_OTHER): Payer: 59 | Admitting: Family Medicine

## 2013-09-26 VITALS — BP 148/112 | HR 90 | Temp 98.0°F | Wt 230.4 lb

## 2013-09-26 DIAGNOSIS — E119 Type 2 diabetes mellitus without complications: Secondary | ICD-10-CM

## 2013-09-26 DIAGNOSIS — F418 Other specified anxiety disorders: Secondary | ICD-10-CM | POA: Insufficient documentation

## 2013-09-26 DIAGNOSIS — F341 Dysthymic disorder: Secondary | ICD-10-CM

## 2013-09-26 DIAGNOSIS — E538 Deficiency of other specified B group vitamins: Secondary | ICD-10-CM

## 2013-09-26 DIAGNOSIS — R0602 Shortness of breath: Secondary | ICD-10-CM

## 2013-09-26 DIAGNOSIS — I10 Essential (primary) hypertension: Secondary | ICD-10-CM

## 2013-09-26 DIAGNOSIS — J45909 Unspecified asthma, uncomplicated: Secondary | ICD-10-CM | POA: Insufficient documentation

## 2013-09-26 MED ORDER — CYANOCOBALAMIN 1000 MCG/ML IJ SOLN: 1000.0000 ug | INTRAMUSCULAR | Status: DC

## 2013-09-26 MED ORDER — LISINOPRIL-HYDROCHLOROTHIAZIDE 10-12.5 MG PO TABS: 1.0000 | ORAL_TABLET | Freq: Every day | ORAL | Status: DC

## 2013-09-26 MED ORDER — BUDESONIDE-FORMOTEROL FUMARATE 160-4.5 MCG/ACT IN AERO: 2.0000 | INHALATION_SPRAY | Freq: Two times a day (BID) | RESPIRATORY_TRACT | Status: DC

## 2013-09-26 NOTE — Progress Notes (Signed)
  Subjective:    [name] is a 47 y.o. female who presents for evaluation of edema in both ankles and feet. The edema has been moderate. Onset of symptoms was several days ago, and patient reports symptoms have gradually worsened since that time. The edema is present in the evening. The patient states the problem is new. The swelling has been aggravated by dependency of involved area. The swelling has been relieved by elevation of involved area. Associated factors include: shortness of breath. Cardiac risk factors: diabetes mellitus, dyslipidemia, hypertension, obesity (BMI >= 30 kg/m2) and sedentary lifestyle.  The following portions of the patient's history were reviewed and updated as appropriate: allergies, current medications, past family history, past medical history, past social history, past surgical history and problem list.  Review of Systems Pertinent items are noted in HPI.   Objective:    BP 148/112  Pulse 90  Temp(Src) 98 F (36.7 C) (Oral)  Wt 230 lb 6.4 oz (104.509 kg)  BMI 38.34 kg/m2  SpO2 98% General appearance: alert, cooperative, appears stated age and no distress Neck: no adenopathy, no JVD, supple, symmetrical, trachea midline and thyroid not enlarged, symmetric, no tenderness/mass/nodules Lungs: clear to auscultation bilaterally Heart: S1, S2 normal Extremities: edema +1 pitting edema b/l LE   Cardiographics ECG: normal sinus rhythm  Imaging Chest x-ray: not available for review   Assessment:     Edema with sob.    Plan:    Recommendations: decrease sodium in the diet, elevate feet above the level of the heart whenever possible, increase physical activity, use of compression stockings, weight loss and diuretic. The patient was also instructed to call IMMEDIATELY (i.e., day or night) if any cardiopulmonary symptoms occur, especially chest pain, shortness of breath, dyspnea on exertion, paroxysmal nocturnal dyspnea, or orthopnea, and these were  explained. Follow up in 2 weeks and as needed.  cxr pending Check labs

## 2013-09-26 NOTE — Assessment & Plan Note (Signed)
symbicort con't proair cxr Consider pulm referral

## 2013-09-26 NOTE — Patient Instructions (Addendum)
Shortness of Breath Shortness of breath means you have trouble breathing. Shortness of breath may indicate that you have a medical problem. You should seek immediate medical care for shortness of breath. CAUSES   Not enough oxygen in the air (as with high altitudes or a smoke-filled room).  Short-term (acute) lung disease, including:  Infections, such as pneumonia.  Fluid in the lungs, such as heart failure.  A blood clot in the lungs (pulmonary embolism).  Long-term (chronic) lung diseases.  Heart disease (heart attack, angina, heart failure, and others).  Low red blood cells (anemia).  Poor physical fitness. This can cause shortness of breath when you exercise.  Chest or back injuries or stiffness.  Being overweight.  Smoking.  Anxiety. This can make you feel like you are not getting enough air. DIAGNOSIS  Serious medical problems can usually be found during your physical exam. Tests may also be done to determine why you are having shortness of breath. Tests may include:  Chest X-rays.  Lung function tests.  Blood tests.  Electrocardiography.  Exercise testing.  Echocardiography.  Imaging scans. Your caregiver may not be able to find a cause for your shortness of breath after your exam. In this case, it is important to have a follow-up exam with your caregiver as directed.  TREATMENT  Treatment for shortness of breath depends on the cause of your symptoms and can vary greatly. HOME CARE INSTRUCTIONS   Do not smoke. Smoking is a common cause of shortness of breath. If you smoke, ask for help to quit.  Avoid being around chemicals or things that may bother your breathing, such as paint fumes and dust.  Rest as needed. Slowly resume your usual activities.  If medicines were prescribed, take them as directed for the full length of time directed. This includes oxygen and any inhaled medicines.  Keep all follow-up appointments as directed by your caregiver. SEEK  MEDICAL CARE IF:   Your condition does not improve in the time expected.  You have a hard time doing your normal activities even with rest.  You have any side effects or problems with the medicines prescribed.  You develop any new symptoms. SEEK IMMEDIATE MEDICAL CARE IF:   Your shortness of breath gets worse.  You feel lightheaded, faint, or develop a cough not controlled with medicines.  You start coughing up blood.  You have pain with breathing.  You have chest pain or pain in your arms, shoulders, or abdomen.  You have a fever.  You are unable to walk up stairs or exercise the way you normally do. MAKE SURE YOU:  Understand these instructions.  Will watch your condition.  Will get help right away if you are not doing well or get worse. Document Released: 09/09/2001 Document Revised: 06/15/2012 Document Reviewed: 03/01/2012 [facility] Patient Information 2014 [place], [place].   Edema Edema is an abnormal build-up of fluids in tissues. Because this is partly dependent on gravity (water flows to the lowest place), it is more common in the legs and thighs (lower extremities). It is also common in the looser tissues, like around the eyes. Painless swelling of the feet and ankles is common and increases as a person ages. It may affect both legs and may include the calves or even thighs. When squeezed, the fluid may move out of the affected area and may leave a dent for a few moments. CAUSES   Prolonged standing or sitting in one place for extended periods of time. Movement helps pump  tissue fluid into the veins, and absence of movement prevents this, resulting in edema.  Varicose veins. The valves in the veins do not work as well as they should. This causes fluid to leak into the tissues.  Fluid and salt overload.  Injury, burn, or surgery to the leg, ankle, or foot, may damage veins and allow fluid to leak out.  Sunburn damages vessels. Leaky vessels allow fluid to go out  into the sunburned tissues.  Allergies (from insect bites or stings, medications or chemicals) cause swelling by allowing vessels to become leaky.  Protein in the blood helps keep fluid in your vessels. Low protein, as in malnutrition, allows fluid to leak out.  Hormonal changes, including pregnancy and menstruation, cause fluid retention. This fluid may leak out of vessels and cause edema.  Medications that cause fluid retention. Examples are sex hormones, blood pressure medications, steroid treatment, or anti-depressants.  Some illnesses cause edema, especially heart failure, kidney disease, or liver disease.  Surgery that cuts veins or lymph nodes, such as surgery done for the heart or for breast cancer, may result in edema. DIAGNOSIS  Your caregiver is usually easily able to determine what is causing your swelling (edema) by simply asking what is wrong (getting a history) and examining you (doing a physical). Sometimes x-rays, EKG (electrocardiogram or heart tracing), and blood work may be done to evaluate for underlying medical illness. TREATMENT  General treatment includes:  Leg elevation (or elevation of the affected body part).  Restriction of fluid intake.  Prevention of fluid overload.  Compression of the affected body part. Compression with elastic bandages or support stockings squeezes the tissues, preventing fluid from entering and forcing it back into the blood vessels.  Diuretics (also called water pills or fluid pills) pull fluid out of your body in the form of increased urination. These are effective in reducing the swelling, but can have side effects and must be used only under your caregiver's supervision. Diuretics are appropriate only for some types of edema. The specific treatment can be directed at any underlying causes discovered. Heart, liver, or kidney disease should be treated appropriately. HOME CARE INSTRUCTIONS   Elevate the legs (or affected body part)  above the level of the heart, while lying down.  Avoid sitting or standing still for prolonged periods of time.  Avoid putting anything directly under the knees when lying down, and do not wear constricting clothing or garters on the upper legs.  Exercising the legs causes the fluid to work back into the veins and lymphatic channels. This may help the swelling go down.  The pressure applied by elastic bandages or support stockings can help reduce ankle swelling.  A low-salt diet may help reduce fluid retention and decrease the ankle swelling.  Take any medications exactly as prescribed. SEEK MEDICAL CARE IF:  Your edema is not responding to recommended treatments. SEEK IMMEDIATE MEDICAL CARE IF:   You develop shortness of breath or chest pain.  You cannot breathe when you lay down; or if, while lying down, you have to get up and go to the window to get your breath.  You are having increasing swelling without relief from treatment.  You develop a fever over 102 F (38.9 C).  You develop pain or redness in the areas that are swollen.  Tell your caregiver right away if you have gained 3 lb/1.4 kg in 1 day or 5 lb/2.3 kg in a week. MAKE SURE YOU:   Understand these instructions.  Will watch your condition.  Will get help right away if you are not doing well or get worse. Document Released: 12/15/2005 Document Revised: 06/15/2012 Document Reviewed: 08/02/2008 [facility] Patient Information 2014 [place], [place].

## 2013-09-27 ENCOUNTER — Ambulatory Visit (HOSPITAL_BASED_OUTPATIENT_CLINIC_OR_DEPARTMENT_OTHER)
Admission: RE | Admit: 2013-09-27 | Discharge: 2013-09-27 | Disposition: A | Discharge status: Home / Self Care | Payer: 59 | Source: Ambulatory Visit | Attending: Family Medicine | Admitting: Family Medicine

## 2013-09-27 DIAGNOSIS — R0602 Shortness of breath: Secondary | ICD-10-CM | POA: Insufficient documentation

## 2013-09-27 DIAGNOSIS — I10 Essential (primary) hypertension: Secondary | ICD-10-CM | POA: Insufficient documentation

## 2013-09-27 DIAGNOSIS — R609 Edema, unspecified: Secondary | ICD-10-CM | POA: Insufficient documentation

## 2013-09-27 LAB — CBC WITH DIFFERENTIAL/PLATELET
Basophils Absolute: 0 10*3/uL (ref 0.0–0.1)
Eosinophils Absolute: 0.1 10*3/uL (ref 0.0–0.7)
HCT: 45 % (ref 36.0–46.0)
Hemoglobin: 15.4 g/dL — ABNORMAL HIGH (ref 12.0–15.0)
Lymphs Abs: 1.7 10*3/uL (ref 0.7–4.0)
MCHC: 34.1 g/dL (ref 30.0–36.0)
Monocytes Absolute: 0.2 10*3/uL (ref 0.1–1.0)
Monocytes Relative: 2.6 % — ABNORMAL LOW (ref 3.0–12.0)
Neutro Abs: 6.4 10*3/uL (ref 1.4–7.7)
Platelets: 227 10*3/uL (ref 150.0–400.0)
RDW: 13.2 % (ref 11.5–14.6)

## 2013-09-27 LAB — BASIC METABOLIC PANEL
BUN: 5 mg/dL — ABNORMAL LOW (ref 6–23)
CO2: 22 mEq/L (ref 19–32)
Calcium: 8.9 mg/dL (ref 8.4–10.5)
Chloride: 105 mEq/L (ref 96–112)
Creatinine, Ser: 0.8 mg/dL (ref 0.4–1.2)
GFR: 76.78 mL/min (ref >60.00)
Glucose, Bld: 107 mg/dL — ABNORMAL HIGH (ref 70–99)
Potassium: 3.5 mEq/L (ref 3.5–5.1)
Sodium: 135 mEq/L (ref 135–145)

## 2013-09-30 ENCOUNTER — Telehealth: Payer: Self-pay | Admitting: Family Medicine

## 2013-09-30 MED ORDER — AZITHROMYCIN 250 MG PO TABS: ORAL_TABLET | ORAL | Status: DC

## 2013-09-30 MED ORDER — GUAIFENESIN-CODEINE 100-10 MG/5ML PO SYRP: 5.0000 mL | ORAL_SOLUTION | Freq: Every evening | ORAL | Status: DC | PRN

## 2013-09-30 NOTE — Telephone Encounter (Signed)
Rx sent--Msg left on VM making the patient aware.    KP

## 2013-09-30 NOTE — Telephone Encounter (Signed)
Patient states that she has a  "horrendous cough" with "dark green clunky stuff." Does not want appointment because she was just in the office Monday, 9/29.  Patient is requesting rx to help cough. Please advise.

## 2013-09-30 NOTE — Telephone Encounter (Signed)
z pack  #1  As directed cheratussin 6 oz 1 -2 tsp po qhs prn cough

## 2013-09-30 NOTE — Telephone Encounter (Signed)
Please advise      KP 

## 2013-10-06 ENCOUNTER — Encounter: Payer: Self-pay | Admitting: Lab

## 2013-10-07 ENCOUNTER — Ambulatory Visit: Payer: Self-pay | Admitting: Family Medicine

## 2013-10-09 ENCOUNTER — Other Ambulatory Visit: Payer: Self-pay | Admitting: Family Medicine

## 2013-11-03 ENCOUNTER — Other Ambulatory Visit: Payer: Self-pay

## 2013-11-04 ENCOUNTER — Ambulatory Visit (INDEPENDENT_AMBULATORY_CARE_PROVIDER_SITE_OTHER): Payer: 59 | Admitting: Family Medicine

## 2013-11-04 ENCOUNTER — Encounter: Payer: Self-pay | Admitting: Family Medicine

## 2013-11-04 VITALS — BP 134/78 | HR 103 | Temp 98.8°F | Wt 224.4 lb

## 2013-11-04 DIAGNOSIS — F329 Major depressive disorder, single episode, unspecified: Secondary | ICD-10-CM

## 2013-11-04 DIAGNOSIS — I10 Essential (primary) hypertension: Secondary | ICD-10-CM

## 2013-11-04 DIAGNOSIS — Z23 Encounter for immunization: Secondary | ICD-10-CM

## 2013-11-04 DIAGNOSIS — N390 Urinary tract infection, site not specified: Secondary | ICD-10-CM

## 2013-11-04 DIAGNOSIS — E785 Hyperlipidemia, unspecified: Secondary | ICD-10-CM

## 2013-11-04 DIAGNOSIS — R112 Nausea with vomiting, unspecified: Secondary | ICD-10-CM

## 2013-11-04 DIAGNOSIS — E1159 Type 2 diabetes mellitus with other circulatory complications: Secondary | ICD-10-CM

## 2013-11-04 LAB — POCT URINALYSIS DIPSTICK
Bilirubin, UA: NEGATIVE
Nitrite, UA: NEGATIVE
Protein, UA: NEGATIVE
pH, UA: 6

## 2013-11-04 LAB — CBC WITH DIFFERENTIAL/PLATELET
Basophils Absolute: 0 10*3/uL (ref 0.0–0.1)
Basophils Relative: 0 % (ref 0–1)
Eosinophils Absolute: 0.2 10*3/uL (ref 0.0–0.7)
Eosinophils Relative: 2 % (ref 0–5)
Lymphs Abs: 2 10*3/uL (ref 0.7–4.0)
MCH: 32.3 pg (ref 26.0–34.0)
MCV: 90.5 fL (ref 78.0–100.0)
Neutro Abs: 7.4 10*3/uL (ref 1.7–7.7)
Neutrophils Relative %: 73 % (ref 43–77)
Platelets: 308 10*3/uL (ref 150–400)
RDW: 13.5 % (ref 11.5–15.5)

## 2013-11-04 LAB — HEMOGLOBIN A1C: Mean Plasma Glucose: 140 mg/dL — ABNORMAL HIGH (ref <117)

## 2013-11-04 MED ORDER — VORTIOXETINE HBR 10 MG PO TABS: 1.0000 | ORAL_TABLET | Freq: Every day | ORAL | Status: DC

## 2013-11-04 MED ORDER — ONDANSETRON HCL 8 MG PO TABS: 8.0000 mg | ORAL_TABLET | Freq: Three times a day (TID) | ORAL | Status: DC | PRN

## 2013-11-04 NOTE — Patient Instructions (Signed)

## 2013-11-04 NOTE — Progress Notes (Signed)
Subjective:     [name] is a 47 y.o. female who presents for follow up of diabetes.. Current symptoms include: nausea and vomitting. Patient denies foot ulcerations, hyperglycemia, hypoglycemia , increased appetite, paresthesia of the feet, polydipsia, polyuria, visual disturbances and weight loss. Evaluation to date has been: fasting blood sugar and fasting lipid panel. Home sugars: patient does not check sugars. Current treatments: more intensive attention to diet which has been effective, Continued metformin which has been effective and Continued ACE inhibitor/ARB which has been effective. Last dilated eye exam 2013.  The following portions of the patient's history were reviewed and updated as appropriate:  She  has a past medical history of Panic disorder; Extremity numbness; Foot fracture; Frequent falls; Vision problems; Hair loss; Memory loss; Hypotension; Abdominal pain; Leg pain; Bruises easily; Kidney stones; Arthritis; Anemia; Irritable bowel; Depression; and Migraines. She  does not have any pertinent problems on file. She  has past surgical history that includes Cholecystectomy; Laparoscopic gastric banding; Foot surgery (Left); Abdominoplasty; Abdominal hysterectomy; and Tonsillectomy. Her family history includes Deep vein thrombosis in her mother; Hypertension in her father and mother. She  reports that she has been smoking.  She has never used smokeless tobacco. She reports that she does not drink alcohol or use illicit drugs. She has a current medication list which includes the following prescription(s): alprazolam, atorvastatin, azithromycin, budesonide-formoterol, cyanocobalamin, duloxetine, gabapentin, glucose blood, guaifenesin-codeine, lisinopril-hydrochlorothiazide, metformin, vyvanse, zolpidem, and ondansetron. Current Outpatient Prescriptions on File Prior to Visit  Medication Sig Dispense Refill  . ALPRAZolam (XANAX) 0.5 MG tablet Take 0.5 mg by mouth 4 (four) times  daily as needed.        Marland Kitchen atorvastatin (LIPITOR) 10 MG tablet TAKE 1 TABLET BY MOUTH EVERY DAY  30 tablet  2  . azithromycin (ZITHROMAX) 250 MG tablet Take as directed  6 tablet  0  . budesonide-formoterol (SYMBICORT) 160-4.5 MCG/ACT inhaler Inhale 2 puffs into the lungs 2 (two) times daily.  1 Inhaler  12  . cyanocobalamin (,VITAMIN B-12,) 1000 MCG/ML injection Inject 1 mL (1,000 mcg total) into the muscle every 30 (thirty) days.  1 mL  5  . DULoxetine (CYMBALTA) 60 MG capsule 1 po qd  30 capsule  5  . gabapentin (NEURONTIN) 600 MG tablet Take 1 tablet (600 mg total) by mouth daily.      Marland Kitchen glucose blood (ONE TOUCH ULTRA TEST) test strip 1 each by Other route as needed for other. Use as instructed      . guaiFENesin-codeine (ROBITUSSIN AC) 100-10 MG/5ML syrup Take 5-10 mLs by mouth at bedtime as needed for cough.  120 mL  0  . lisinopril-hydrochlorothiazide (PRINZIDE,ZESTORETIC) 10-12.5 MG per tablet Take 1 tablet by mouth daily.  90 tablet  3  . metFORMIN (GLUCOPHAGE XR) 500 MG 24 hr tablet 1 po qam and 2 po q pm  90 tablet  2  . VYVANSE 60 MG capsule Take 1 capsule by mouth daily.      Marland Kitchen zolpidem (AMBIEN) 10 MG tablet Take 10 mg by mouth at bedtime as needed.         No current facility-administered medications on file prior to visit.   She has No Known Allergies..  Review of Systems Pertinent items are noted in HPI.    Objective:    BP 134/78  Pulse 103  Temp(Src) 98.8 F (37.1 C) (Oral)  Wt 224 lb 6.4 oz (101.787 kg)  SpO2 98% General appearance: alert, cooperative, appears stated age and no  distress Throat: lips, mucosa, and tongue normal; teeth and gums normal Neck: no adenopathy, no carotid bruit, no JVD, supple, symmetrical, trachea midline and thyroid not enlarged, symmetric, no tenderness/mass/nodules Lungs: clear to auscultation bilaterally Heart: regular rate and rhythm, S1, S2 normal, no murmur, click, rub or gallop Extremities: extremities normal, atraumatic, no  cyanosis or edema    Sensory exam of the foot is normal, tested with the monofilament. Good pulses, no lesions or ulcers, good peripheral pulses.  Patient was evaluated for proper footwear and sizing.  Laboratory: No components found with this basename: A1C      Assessment:    Diabetes mellitus Type II, under fair control.    Plan:    Discussed general issues about diabetes pathophysiology and management. Encouraged aerobic exercise. Discussed foot care. Reminded to get yearly retinal exam. Continued metformin; see  medication orders. Continued ACE inhibitor; see medication orders. Labs: fasting blood sugar, fasting lipid panel, hemoglobin A1C and microalbuminuria.  Subjective:     [name] is a 47 y.o. female who presents for follow up of diabetes.. Current symptoms include: none. Patient denies foot ulcerations, hyperglycemia, hypoglycemia , increased appetite, nausea, paresthesia of the feet, polydipsia, polyuria, visual disturbances, vomiting and weight loss. Evaluation to date has been: fasting blood sugar, fasting lipid panel, hemoglobin A1C and microalbuminuria. Home sugars: BGs consistently in an acceptable range. Current treatments: Continued metformin which has been effective, Continued statin which has been effective and Continued ACE inhibitor/ARB which has been effective. Last dilated eye exam due.  HYPERTENSION Disease Monitoring Blood pressure range-good Chest pain- no      Dyspnea- no Medications Compliance- good Lightheadedness- no   Edema- no    HYPERLIPIDEMIA Disease Monitoring See symptoms for Hypertension Medications Compliance- good RUQ pain- no  Muscle aches- no  ROS See HPI above   PMH Smoking Status noted   The following portions of the patient's history were reviewed and updated as appropriate: allergies, current medications, past family history, past medical history, past social history, past surgical history and problem list.  Review  of Systems Pertinent items are noted in HPI.    Objective:    BP 134/78  Pulse 103  Temp(Src) 98.8 F (37.1 C) (Oral)  Wt 224 lb 6.4 oz (101.787 kg)  SpO2 98% General appearance: alert, cooperative, appears stated age and no distress Throat: lips, mucosa, and tongue normal; teeth and gums normal Neck: no adenopathy, no carotid bruit, no JVD, supple, symmetrical, trachea midline and thyroid not enlarged, symmetric, no tenderness/mass/nodules Lungs: clear to auscultation bilaterally Heart: S1, S2 normal Extremities: extremities normal, atraumatic, no cyanosis or edema   Sensory exam of the foot is normal, tested with the monofilament. Good pulses, no lesions or ulcers, good peripheral pulses.  Patient was evaluated for proper footwear and sizing.  Laboratory: No components found with this basename: A1C      Assessment:    Diabetes mellitus Type II, under good control.    Plan:    Discussed general issues about diabetes pathophysiology and management. Continued metformin; see  medication orders. Continued statin drug see medication orders. Continued ACE inhibitor; see medication orders. Follow up in 3 months or as needed.

## 2013-11-05 LAB — LIPID PANEL
Cholesterol: 151 mg/dL (ref 0–200)
HDL: 40 mg/dL (ref >39)
LDL Cholesterol: 73 mg/dL (ref 0–99)
Total CHOL/HDL Ratio: 3.8 Ratio
Triglycerides: 189 mg/dL — ABNORMAL HIGH (ref <150)

## 2013-11-05 LAB — HEPATIC FUNCTION PANEL
ALT: 30 U/L (ref 0–35)
Albumin: 4.2 g/dL (ref 3.5–5.2)
Total Bilirubin: 0.8 mg/dL (ref 0.3–1.2)

## 2013-11-05 LAB — MICROALBUMIN / CREATININE URINE RATIO
Creatinine, Urine: 252.1 mg/dL
Microalb Creat Ratio: 3.5 mg/g (ref 0.0–30.0)

## 2013-11-05 LAB — BASIC METABOLIC PANEL
Calcium: 9.8 mg/dL (ref 8.4–10.5)
Chloride: 100 mEq/L (ref 96–112)
Creat: 0.79 mg/dL (ref 0.50–1.10)

## 2013-11-06 DIAGNOSIS — I10 Essential (primary) hypertension: Secondary | ICD-10-CM | POA: Insufficient documentation

## 2013-11-06 DIAGNOSIS — E785 Hyperlipidemia, unspecified: Secondary | ICD-10-CM | POA: Insufficient documentation

## 2013-11-06 NOTE — Assessment & Plan Note (Signed)
stable °

## 2013-11-06 NOTE — Assessment & Plan Note (Signed)
Check labs con't meds 

## 2013-11-07 LAB — URINE CULTURE: Colony Count: >=100000

## 2013-11-09 ENCOUNTER — Telehealth: Payer: Self-pay | Admitting: *Deleted

## 2013-11-09 ENCOUNTER — Other Ambulatory Visit: Payer: Self-pay | Admitting: *Deleted

## 2013-11-09 MED ORDER — CIPROFLOXACIN HCL 500 MG PO TABS: 500.0000 mg | ORAL_TABLET | Freq: Two times a day (BID) | ORAL | Status: DC

## 2013-11-09 NOTE — Telephone Encounter (Signed)
Patient was notified and Rx sent to pharmacy.

## 2013-11-09 NOTE — Telephone Encounter (Signed)
Message copied by Dion Body on Wed Nov 09, 2013 12:20 PM ------      Message from: Lelon Perla      Created: Tue Nov 08, 2013  6:06 PM       cipro 500 mg 1 po bid for 3 days ------

## 2013-11-11 ENCOUNTER — Encounter (INDEPENDENT_AMBULATORY_CARE_PROVIDER_SITE_OTHER): Payer: 59 | Admitting: Surgery

## 2013-12-05 ENCOUNTER — Other Ambulatory Visit: Payer: Self-pay | Admitting: Family Medicine

## 2013-12-14 ENCOUNTER — Other Ambulatory Visit: Payer: Self-pay | Admitting: Family Medicine

## 2014-02-06 ENCOUNTER — Encounter: Payer: Self-pay | Admitting: Family Medicine

## 2014-02-06 ENCOUNTER — Ambulatory Visit (INDEPENDENT_AMBULATORY_CARE_PROVIDER_SITE_OTHER): Payer: 59 | Admitting: Family Medicine

## 2014-02-06 VITALS — BP 128/75 | HR 79 | Temp 98.0°F | Wt 212.0 lb

## 2014-02-06 DIAGNOSIS — E785 Hyperlipidemia, unspecified: Secondary | ICD-10-CM

## 2014-02-06 DIAGNOSIS — E1159 Type 2 diabetes mellitus with other circulatory complications: Secondary | ICD-10-CM

## 2014-02-06 DIAGNOSIS — F32A Depression, unspecified: Secondary | ICD-10-CM

## 2014-02-06 DIAGNOSIS — F329 Major depressive disorder, single episode, unspecified: Secondary | ICD-10-CM

## 2014-02-06 DIAGNOSIS — N390 Urinary tract infection, site not specified: Secondary | ICD-10-CM

## 2014-02-06 DIAGNOSIS — J45909 Unspecified asthma, uncomplicated: Secondary | ICD-10-CM

## 2014-02-06 DIAGNOSIS — F3289 Other specified depressive episodes: Secondary | ICD-10-CM

## 2014-02-06 LAB — POCT URINALYSIS DIPSTICK
Bilirubin, UA: NEGATIVE
Blood, UA: NEGATIVE
Glucose, UA: NEGATIVE
Ketones, UA: NEGATIVE
NITRITE UA: POSITIVE
PH UA: 6
PROTEIN UA: NEGATIVE
Spec Grav, UA: 1.025
Urobilinogen, UA: 0.2

## 2014-02-06 MED ORDER — ALBUTEROL SULFATE HFA 108 (90 BASE) MCG/ACT IN AERS: INHALATION_SPRAY | RESPIRATORY_TRACT | Status: DC

## 2014-02-06 MED ORDER — FLUOXETINE HCL 20 MG PO TABS: 20.0000 mg | ORAL_TABLET | Freq: Every day | ORAL | Status: DC

## 2014-02-06 NOTE — Patient Instructions (Signed)

## 2014-02-06 NOTE — Progress Notes (Signed)
Patient ID: [name], female   DOB: 03/18/1966, 48 y.o.   MRN: [id number]   Subjective:    Patient ID: Faith BachelorAngela G Christman, female    DOB: 08/23/1966, 48 y.o.   MRN: [id number] HPI  HPI HYPERTENSION  Blood pressure range-not checking  Chest pain- no      Dyspnea- no Lightheadedness- no   Edema- no Other side effects - no   Medication compliance: good Low salt diet- yes  DIABETES  Blood Sugar ranges-good per pt  Polyuria- no New Visual problems- no Hypoglycemic symptoms- no Other side effects-no Medication compliance - good Last eye exam- due Foot exam- today  HYPERLIPIDEMIA  Medication compliance- good RUQ pain- no  Muscle aches- no Other side effects-no  Depression f/u  Medication compliance: not on any Side effects?: no Feeling better/worse/same? worse Suicidal / homicidal ideation? no Sleeping well? no Do PHQ9 if necessary (CMA)----pt seeing psych ROS See HPI above   PMH Smoking Status noted             Objective:    BP 128/75  Pulse 79  Temp(Src) 98 F (36.7 C) (Oral)  Wt 212 lb (96.163 kg)  SpO2 100% General appearance: alert, cooperative, appears stated age and no distress Nose: Nares normal. Septum midline. Mucosa normal. No drainage or sinus tenderness. Throat: lips, mucosa, and tongue normal; teeth and gums normal Neck: no adenopathy, supple, symmetrical, trachea midline and thyroid not enlarged, symmetric, no tenderness/mass/nodules Lungs: clear to auscultation bilaterally Heart: S1, S2 normal Extremities: extremities normal, atraumatic, no cyanosis or edema Sensory exam of the foot is normal, tested with the monofilament. Good pulses, no lesions or ulcers, good peripheral pulses.       Assessment & Plan:   1. Depression F/u psych - FLUoxetine (PROZAC) 20 MG tablet; Take 1 tablet (20 mg total) by mouth daily.  Dispense: 30 tablet; Refill: 3  2. Type II or unspecified type diabetes mellitus with peripheral circulatory disorders,  uncontrolled(250.72) Check labs , cont meds - Basic metabolic panel - CBC with Differential - Hemoglobin A1c - POCT urinalysis dipstick  3. Other and unspecified hyperlipidemia Check labs-- con't meds - CBC with Differential - Hepatic function panel - Lipid panel - POCT urinalysis dipstick  4. Unspecified asthma(493.90) Refer to pulm Quit smoking - Ambulatory referral to Pulmonology

## 2014-02-06 NOTE — Progress Notes (Signed)
Pre visit review using our clinic review tool, if applicable. No additional management support is needed unless otherwise documented below in the visit note. 

## 2014-02-07 ENCOUNTER — Telehealth: Payer: Self-pay | Admitting: Family Medicine

## 2014-02-07 LAB — CBC WITH DIFFERENTIAL/PLATELET
BASOS PCT: 0 % (ref 0.0–3.0)
Basophils Absolute: 0 10*3/uL (ref 0.0–0.1)
EOS PCT: 3.6 % (ref 0.0–5.0)
Eosinophils Absolute: 0.3 10*3/uL (ref 0.0–0.7)
HEMATOCRIT: 41.9 % (ref 36.0–46.0)
HEMOGLOBIN: 13.9 g/dL (ref 12.0–15.0)
LYMPHS ABS: 1.7 10*3/uL (ref 0.7–4.0)
Lymphocytes Relative: 19 % (ref 12.0–46.0)
MCHC: 33.1 g/dL (ref 30.0–36.0)
MCV: 98.6 fl (ref 78.0–100.0)
MONOS PCT: 1.2 % — AB (ref 3.0–12.0)
Monocytes Absolute: 0.1 10*3/uL (ref 0.1–1.0)
Neutro Abs: 6.6 10*3/uL (ref 1.4–7.7)
Neutrophils Relative %: 76.2 % (ref 43.0–77.0)
Platelets: 252 10*3/uL (ref 150.0–400.0)
RBC: 4.25 Mil/uL (ref 3.87–5.11)
RDW: 13.6 % (ref 11.5–14.6)
WBC: 8.7 10*3/uL (ref 4.5–10.5)

## 2014-02-07 LAB — HEPATIC FUNCTION PANEL
ALK PHOS: 62 U/L (ref 39–117)
ALT: 18 U/L (ref 0–35)
AST: 24 U/L (ref 0–37)
Albumin: 3.6 g/dL (ref 3.5–5.2)
BILIRUBIN DIRECT: 0.1 mg/dL (ref 0.0–0.3)
BILIRUBIN TOTAL: 1 mg/dL (ref 0.3–1.2)
Total Protein: 6.9 g/dL (ref 6.0–8.3)

## 2014-02-07 LAB — LIPID PANEL
CHOL/HDL RATIO: 3
Cholesterol: 148 mg/dL (ref 0–200)
HDL: 44.4 mg/dL (ref >39.00)
LDL Cholesterol: 74 mg/dL (ref 0–99)
Triglycerides: 147 mg/dL (ref 0.0–149.0)
VLDL: 29.4 mg/dL (ref 0.0–40.0)

## 2014-02-07 LAB — HEMOGLOBIN A1C: Hgb A1c MFr Bld: 5.5 % (ref 4.6–6.5)

## 2014-02-07 LAB — BASIC METABOLIC PANEL
BUN: 9 mg/dL (ref 6–23)
CHLORIDE: 103 meq/L (ref 96–112)
CO2: 26 mEq/L (ref 19–32)
Calcium: 9.2 mg/dL (ref 8.4–10.5)
Creatinine, Ser: 0.7 mg/dL (ref 0.4–1.2)
GFR: 97.84 mL/min (ref >60.00)
Glucose, Bld: 82 mg/dL (ref 70–99)
Potassium: 3.2 mEq/L — ABNORMAL LOW (ref 3.5–5.1)
SODIUM: 139 meq/L (ref 135–145)

## 2014-02-07 NOTE — Telephone Encounter (Signed)
Relevant patient education mailed to patient.  

## 2014-02-08 ENCOUNTER — Other Ambulatory Visit: Payer: Self-pay

## 2014-02-08 MED ORDER — POTASSIUM CHLORIDE CRYS ER 20 MEQ PO TBCR: 20.0000 meq | EXTENDED_RELEASE_TABLET | Freq: Every day | ORAL | Status: DC

## 2014-02-09 LAB — URINE CULTURE: Colony Count: >=100000

## 2014-02-13 ENCOUNTER — Encounter: Payer: Self-pay | Admitting: Family Medicine

## 2014-02-15 ENCOUNTER — Other Ambulatory Visit: Payer: Self-pay | Admitting: Family Medicine

## 2014-02-27 ENCOUNTER — Institutional Professional Consult (permissible substitution): Payer: Self-pay | Admitting: Critical Care Medicine

## 2014-03-02 ENCOUNTER — Institutional Professional Consult (permissible substitution): Payer: Self-pay | Admitting: Critical Care Medicine

## 2014-03-09 ENCOUNTER — Other Ambulatory Visit: Payer: Self-pay | Admitting: Family Medicine

## 2014-05-09 ENCOUNTER — Telehealth: Payer: Self-pay | Admitting: Family Medicine

## 2014-05-09 NOTE — Telephone Encounter (Signed)
Caller name: Marylene Landngela Relation to pt: Call back number:[phone] Pharmacy:  [facility], [place]  Reason for call:  Pt is wanting to schedule dental surgery to have a tooth removed, but the gum and area surrounding tooth is swollen with infection.  Pt is wanting an antibiotic to fight the infection so they can have the tooth removed.  Please contact pt to advise.

## 2014-05-09 NOTE — Telephone Encounter (Signed)
Left message for call back. Identifiable.   

## 2014-05-09 NOTE — Telephone Encounter (Addendum)
C/O:  L side gum swelling; tooth/gum pain-described as throbbing; broken tooth to gumline.    Patient's previous dentist has retired.   She recently spoke with another dentist, whom she has not established care with yet, and he suggested that patient be prescribed an antibiotic and once the infection is gone, patient can then have tooth removed.  Three different appointment dates and times were offered to patient, including today as a walk in, Friday and Monday.  Patient stated that due to work, she may or may not be able to make the appointments.  Patient stated that she would make a call (she did not specify as to whom she was calling) and would call right back.  Awaiting call.

## 2014-05-11 NOTE — Telephone Encounter (Signed)
Called patient back approximately 5 minutes after hanging up with her to discuss the possibility of scheduling an appointment with [facility] [address] to have them assess her tooth (as they accept most dental insurances and provide emergency dental care).  Patient did not pick up, but a voice message was left stating the same. She was encouraged to call back with questions or concerns.

## 2014-05-11 NOTE — Telephone Encounter (Signed)
Called patient to follow- up with her.  Patient stated that she was going to hold off until after this weekend or her symptoms become worse.  [place] and does not want to come in to be seen.  Since we last spoke, the patient has been self-treating with tree tea oil and garlic clove.  According to patient, this treatment has been helpful. Pain and swelling has decreased.  She is afebrile.  Offered again to schedule an appointment and patient stated that she wanted to wait.  Patient was instructed that her tooth could become severely infected and cause sepsis.  Patient was encouraged to monitor for signs of sepsis such as fever, chills, weakness, confusion, low blood pressures, shortness of breath and increased heart rate.  Patient stated understanding and was able to restate the symptoms listed above.  She was strongly encouraged to go to a local ER [place] to in the event she starts to experience any of these symptoms.   She again stated understanding and agree with plan.  No further questions or concerns voiced.

## 2014-06-23 ENCOUNTER — Ambulatory Visit (INDEPENDENT_AMBULATORY_CARE_PROVIDER_SITE_OTHER): Payer: 59 | Admitting: Family Medicine

## 2014-06-23 ENCOUNTER — Other Ambulatory Visit: Payer: Self-pay | Admitting: Family Medicine

## 2014-06-23 ENCOUNTER — Encounter: Payer: Self-pay | Admitting: Family Medicine

## 2014-06-23 VITALS — BP 134/86 | HR 77 | Temp 98.3°F | Wt 191.0 lb

## 2014-06-23 DIAGNOSIS — G609 Hereditary and idiopathic neuropathy, unspecified: Secondary | ICD-10-CM

## 2014-06-23 DIAGNOSIS — E232 Diabetes insipidus: Secondary | ICD-10-CM

## 2014-06-23 DIAGNOSIS — E1149 Type 2 diabetes mellitus with other diabetic neurological complication: Secondary | ICD-10-CM

## 2014-06-23 DIAGNOSIS — R21 Rash and other nonspecific skin eruption: Secondary | ICD-10-CM

## 2014-06-23 DIAGNOSIS — G629 Polyneuropathy, unspecified: Secondary | ICD-10-CM

## 2014-06-23 LAB — BASIC METABOLIC PANEL
BUN: 9 mg/dL (ref 6–23)
CO2: 28 mEq/L (ref 19–32)
Calcium: 10.4 mg/dL (ref 8.4–10.5)
Chloride: 101 mEq/L (ref 96–112)
Creat: 0.78 mg/dL (ref 0.50–1.10)
Glucose, Bld: 81 mg/dL (ref 70–99)
Potassium: 3.8 mEq/L (ref 3.5–5.3)
Sodium: 139 mEq/L (ref 135–145)

## 2014-06-23 LAB — APTT: aPTT: 30 seconds (ref 24–37)

## 2014-06-23 LAB — HEPATIC FUNCTION PANEL
ALBUMIN: 3.9 g/dL (ref 3.5–5.2)
ALT: 8 U/L (ref 0–35)
AST: 11 U/L (ref 0–37)
Alkaline Phosphatase: 72 U/L (ref 39–117)
BILIRUBIN TOTAL: 0.7 mg/dL (ref 0.2–1.2)
Bilirubin, Direct: 0.1 mg/dL (ref 0.0–0.3)
Indirect Bilirubin: 0.6 mg/dL (ref 0.2–1.2)
Total Protein: 6.9 g/dL (ref 6.0–8.3)

## 2014-06-23 LAB — HEMOGLOBIN A1C
Hgb A1c MFr Bld: 5.7 % — ABNORMAL HIGH (ref <5.7)
Mean Plasma Glucose: 117 mg/dL — ABNORMAL HIGH (ref <117)

## 2014-06-23 LAB — PROTIME-INR
INR: 0.97 (ref <1.50)
Prothrombin Time: 12.9 seconds (ref 11.6–15.2)

## 2014-06-23 LAB — SEDIMENTATION RATE: SED RATE: 7 mm/h (ref 0–22)

## 2014-06-23 MED ORDER — PREGABALIN 75 MG PO CAPS: 75.0000 mg | ORAL_CAPSULE | Freq: Three times a day (TID) | ORAL | Status: DC

## 2014-06-23 NOTE — Progress Notes (Signed)
   Subjective:    Patient ID: [name], female    DOB: 09-23-1966, 48 y.o.   MRN: [id number]  HPI Pt here c/o rash on legs yesterday that went away within hours. They were not itchy-- she states they were red circular lesions all over legs.  No discomfort.   Her periopheral neuropathy has gotten much worse as well.  Sh estopped gabepentin because it stopped working.   She would also like a new neurologist.    Review of Systems As above    Objective:   Physical Exam BP 134/86  Pulse 77  Temp(Src) 98.3 F (36.8 C) (Oral)  Wt 191 lb (86.637 kg)  SpO2 97% General appearance: alert, cooperative, appears stated age and no distress Throat: lips, mucosa, and tongue normal; teeth and gums normal Neck: no adenopathy, no carotid bruit, no JVD, supple, symmetrical, trachea midline and thyroid not enlarged, symmetric, no tenderness/mass/nodules Lungs: clear to auscultation bilaterally Heart: S1, S2 normal and S1: normal Extremities: extremities normal, atraumatic, no cyanosis or edema Skin-- no lesions, rashes        Assessment & Plan:  1. Rash and nonspecific skin eruption Check labs - Vitamin B1 - Sed Rate (ESR) - PTT - INR/PT - Hepatic function panel - Hemoglobin T2K - Basic Metabolic Panel (BMET)  2. Peripheral neuropathy Check labs - pregabalin (LYRICA) 75 MG capsule; Take 1 capsule (75 mg total) by mouth 3 (three) times daily.  Dispense: 90 capsule; Refill: 3 - Ambulatory referral to Neurology - Vitamin B1 - Sed Rate (ESR) - PTT - INR/PT - Hepatic function panel - Hemoglobin M6K - Basic Metabolic Panel (BMET) - Basic Metabolic Panel (BMET)  3.Type II or unspecified type diabetes mellitus with neurological manifestations, not stated as uncontrolled Check labs - Vitamin B1 - Sed Rate (ESR) - PTT - INR/PT - Hepatic function panel - Hemoglobin M6N - Basic Metabolic Panel (BMET)

## 2014-06-23 NOTE — Patient Instructions (Signed)

## 2014-06-23 NOTE — Progress Notes (Signed)
Pre visit review using our clinic review tool, if applicable. No additional management support is needed unless otherwise documented below in the visit note. 

## 2014-06-27 ENCOUNTER — Telehealth: Payer: Self-pay | Admitting: *Deleted

## 2014-06-27 LAB — LIPID PANEL
CHOLESTEROL: 182 mg/dL (ref 0–200)
HDL: 53 mg/dL (ref >39)
LDL Cholesterol: 96 mg/dL (ref 0–99)
Total CHOL/HDL Ratio: 3.4 Ratio
Triglycerides: 163 mg/dL — ABNORMAL HIGH (ref <150)
VLDL: 33 mg/dL (ref 0–40)

## 2014-06-27 LAB — VITAMIN B12: Vitamin B-12: 752 pg/mL (ref 211–911)

## 2014-06-27 MED ORDER — PREGABALIN 50 MG PO CAPS: 50.0000 mg | ORAL_CAPSULE | Freq: Two times a day (BID) | ORAL | Status: DC

## 2014-06-27 MED ORDER — PREGABALIN 50 MG PO CAPS: 50.0000 mg | ORAL_CAPSULE | Freq: Three times a day (TID) | ORAL | Status: DC

## 2014-06-27 NOTE — Telephone Encounter (Signed)
We will lower dose-- tell her to take it bid ---- should only be a few days ---- if she still feels the same on 50 mg-- try only 1 a day

## 2014-06-27 NOTE — Telephone Encounter (Signed)
Letter complete and the patient has been made aware. Rx faxed     KP

## 2014-06-27 NOTE — Telephone Encounter (Signed)
We can decrease to 50 mg tid prn  #90

## 2014-06-27 NOTE — Telephone Encounter (Signed)
Caller name:  Marylene Landngela Relation to pt:  self Call back number: [phone]  Pharmacy:  [facility]  Reason for call:   Pt called and stated the pregabalin (LYRICA) 75 MG capsule is working, but causing her to feel "drunk, high, woozy" so she is having to stay out of work.  She is needing a note for work from last Thursday thru today.  She is worried about tomorrow and trying to work with the way the medication makes her feel.  Pt wants to know if these side effects are normal and how long they will last.  Please call pt at the above number.

## 2014-06-27 NOTE — Telephone Encounter (Addendum)
Patient still sounds like she is still under the influence of the medication, patient wants a work note and wants know how long she is going to feel this way, she last took the medication last night. She is not wanting to be taken off of the medication because and willing to decrease. Please advise on work note and when you would like for the patient to go back to work.       KP

## 2014-06-27 NOTE — Telephone Encounter (Signed)
Please advise      KP 

## 2014-06-27 NOTE — Telephone Encounter (Signed)
Per Dr.Lowne ok for note from 06/22/14 and 07/03/14.      Kp

## 2014-06-28 LAB — VITAMIN B1: Vitamin B1 (Thiamine): 6 nmol/L — ABNORMAL LOW (ref 8–30)

## 2014-07-21 ENCOUNTER — Telehealth: Payer: Self-pay | Admitting: Family Medicine

## 2014-07-21 MED ORDER — LISDEXAMFETAMINE DIMESYLATE 60 MG PO CAPS: 60.0000 mg | ORAL_CAPSULE | Freq: Every day | ORAL | Status: DC

## 2014-07-21 NOTE — Telephone Encounter (Signed)
Caller name: Marylene Landngela Relation to pt: Call back number:[phone]   Reason for call:  Pt wants to get a script for VYVANSE 60 MG capsule.  Pt states she spoke with Dr. Laury AxonLowne about getting this filled by her.  Pt states she is out of the RX.

## 2014-07-21 NOTE — Telephone Encounter (Signed)
Prescription printed, signed and placed up front for pick up.  Pt made aware.

## 2014-07-21 NOTE — Telephone Encounter (Signed)
Refill Request:  VYVANSE 60 MG capsule--Take 1 capsule by mouth daily.  Last Filled:  Historical Med Last OV:  06/23/14  Please advise.

## 2014-07-21 NOTE — Telephone Encounter (Signed)
Ok for #30, no refills 

## 2014-07-26 ENCOUNTER — Encounter: Payer: Self-pay | Admitting: Neurology

## 2014-07-26 ENCOUNTER — Ambulatory Visit (INDEPENDENT_AMBULATORY_CARE_PROVIDER_SITE_OTHER): Payer: 59 | Admitting: Neurology

## 2014-07-26 ENCOUNTER — Other Ambulatory Visit: Payer: Self-pay | Admitting: Family Medicine

## 2014-07-26 VITALS — BP 158/100 | HR 75 | Ht 65.35 in | Wt 198.4 lb

## 2014-07-26 DIAGNOSIS — F329 Major depressive disorder, single episode, unspecified: Secondary | ICD-10-CM

## 2014-07-26 DIAGNOSIS — F411 Generalized anxiety disorder: Secondary | ICD-10-CM

## 2014-07-26 DIAGNOSIS — F3289 Other specified depressive episodes: Secondary | ICD-10-CM

## 2014-07-26 DIAGNOSIS — R209 Unspecified disturbances of skin sensation: Secondary | ICD-10-CM

## 2014-07-26 DIAGNOSIS — F32A Depression, unspecified: Secondary | ICD-10-CM

## 2014-07-26 NOTE — Telephone Encounter (Signed)
Pt is checking on status.

## 2014-07-26 NOTE — Progress Notes (Signed)
[facility] Clinic Note - Initial Visit   Date: 07/26/2014  [name] MRN: [id number] DOB: 23-Jan-1966   Dear Dr. Etter Sjogren:  Thank you for your kind referral of [name] for consultation of numbness/tingling of her body. Although her history is well known to you, please allow [place] to reiterate it for the purpose of our medical record. The patient was accompanied to the clinic by husband who also provides collateral information.     History of Present Illness: [name] is a 48 y.o. right-handed Caucasian female with history of hypertension, hyperlipidemia, diabetes mellitus (HbA1c 5.7), panic attack, and depression/anxiety presenting for evaluation of numbness/tingling of her body.    Starting around 2010, she developed weakness associated with numbness and tinlging of her left hand but it was associated with panic attacks.  Over the years, she developed numbness, burning, tingling pain of her feet which has steadily travelled to her knees.  She has similar symptoms involving her hands, forearm, and upper arms.  She also has the same symptoms involving the face and abdomen.  She reports falling about 3-4 years ago.  In 2013 - 2014, she saw Dr. Dione Booze, neurology, at [facility] for the same symptoms.  She had imaging of the cervical spine, electrodiagnostic testing, and serology testing which was all normal. Patient was recommended to start Topamax for her paresthesias, but she did not find any benefit this. Most recently she was seen by her primary care Dr. For the same complaints who started her on 50 mg Lyrica twice daily. After taking Lyrica 50 mg, her numbness tingling completely resolved, but only lasts for about 6 hours. She is unable to take twice daily dosing do to excessive fatigue and somnolence.  She has previously tried gabapentin, cymbalta, and topamax which did not help.    Of note, she endorses a significant amount of stress and  a strong history of anxiety and panic attacks. Her paresthesias are always worse when her anxiety is exacerbated. She has previously seen to therapists but did not find much benefit with them. She reports having a lot of depression and anxiety related to family issues, death of family members, as well as personal situations. She became very tearful when talking about this and   Out-side paper records, electronic medical record, and images have been reviewed where available and summarized as:  Component     Latest Ref Rng 06/23/2014  Hemoglobin A1C     <5.7 % 5.7 (H)  Mean Plasma Glucose     <117 mg/dL 117 (H)  Vitamin B1 (Thiamine)     8 - 30 nmol/L 6 (L)  Sed Rate     0 - 22 mm/hr 7  Vitamin B-12     211 - 911 pg/mL 752   Labs 2014:  ESR, RPR, SPEP, SSA, SSB, Ferritin normal  MRI cervical wwo contrast 01/31/2013: 1. No findings to suggest transverse myelitis. 2. Degenerative changes at C5-C6 where endplate changes and a disc herniation efface the CSF ventral to the cord, especially on the right.    Past Medical History  Diagnosis Date  . Panic disorder   . Extremity numbness     legs, feet, arms, hands  . Foot fracture     bilateral  . Frequent falls   . Vision problems   . Hair loss   . Memory loss   . Hypotension   . Abdominal pain   . Leg pain  when driving long distances  . Bruises easily   . Kidney stones   . Arthritis   . Anemia   . Irritable bowel   . Depression   . Migraines     Past Surgical History  Procedure Laterality Date  . Cholecystectomy    . Laparoscopic gastric banding    . Foot surgery Left     Plantar Fasc  . Abdominoplasty    . Abdominal hysterectomy    . Tonsillectomy       Medications:  Current Outpatient Prescriptions on File Prior to Visit  Medication Sig Dispense Refill  . budesonide-formoterol (SYMBICORT) 160-4.5 MCG/ACT inhaler Inhale 2 puffs into the lungs 2 (two) times daily.  1 Inhaler  12  . clonazePAM (KLONOPIN) 1 MG  tablet Take 1 mg by mouth 3 (three) times daily.      . cyanocobalamin (,VITAMIN B-12,) 1000 MCG/ML injection INJECT 1 ML (1,000 MCG TOTAL) INTO THE MUSCLE EVERY 30 (THIRTY) DAYS.  1 mL  5  . FLUoxetine (PROZAC) 20 MG tablet Take 1 tablet (20 mg total) by mouth daily.  30 tablet  3  . glucose blood (ONE TOUCH ULTRA TEST) test strip 1 each by Other route as needed for other. Use as instructed      . lisdexamfetamine (VYVANSE) 60 MG capsule Take 1 capsule (60 mg total) by mouth daily.  30 capsule  0  . metFORMIN (GLUCOPHAGE-XR) 500 MG 24 hr tablet TAKE 1 TABLET BY MOUTH IN THE MORNING AND TAKE 2 TABLETS BY MOUTH IN THE EVENING  90 tablet  5  . pregabalin (LYRICA) 50 MG capsule Take 1 capsule (50 mg total) by mouth 2 (two) times daily.  60 capsule  0  . zolpidem (AMBIEN) 10 MG tablet Take 10 mg by mouth at bedtime as needed.         No current facility-administered medications on file prior to visit.    Allergies: No Known Allergies  Family History: Family History  Problem Relation Age of Onset  . Hypertension Mother   . Hypertension Father   . Deep vein thrombosis Mother     Finger  . Panic disorder Mother   . Heart attack Father     Deceased, 57s    Social History: History   Social History  . Marital Status: Married    Spouse Name: N/A    Number of Children: N/A  . Years of Education: N/A   Occupational History  . Not on file.   Social History Main Topics  . Smoking status: Current Every Day Smoker -- 1.00 packs/day for 30 years  . Smokeless tobacco: Never Used  . Alcohol Use: No  . Drug Use: No  . Sexual Activity: Not on file   Other Topics Concern  . Not on file   Social History Narrative   She lives with husband.  They have one grown child.   She works at [employer].   Highest level of education:  Associates degree in early childhood    Review of Systems:  CONSTITUTIONAL: No fevers, chills, night sweats, or weight loss.   EYES: No visual  changes or eye pain ENT: No hearing changes.  No history of nose bleeds.   RESPIRATORY: No cough, wheezing and shortness of breath.   CARDIOVASCULAR: Negative for chest pain, and palpitations.   GI: Negative for abdominal discomfort, blood in stools or black stools.  No recent change in bowel habits.   GU:  No history of  incontinence.   MUSCLOSKELETAL: No history of joint pain or swelling.  No myalgias.   SKIN: Negative for lesions, rash, and itching.   HEMATOLOGY/ONCOLOGY: Negative for prolonged bleeding, bruising easily, and swollen nodes.   ENDOCRINE: Negative for cold or heat intolerance, polydipsia or goiter.   PSYCH:  ++depression or anxiety symptoms.   NEURO: As Above.   Vital Signs:  BP 158/100  Pulse 75  Ht 5' 5.35" (1.66 m)  Wt 198 lb 6 oz (89.982 kg)  BMI 32.65 kg/m2  SpO2 99%  General Medical Exam:   General:  Tearful throughout the exam, slightly anxious and appears depressed.   Eyes/ENT: see cranial nerve examination.   Neck: No masses appreciated.  Full range of motion without tenderness.  No carotid bruits. Respiratory:  Clear to auscultation, good air entry bilaterally.   Cardiac:  Regular rate and rhythm, no murmur.    Extremities:  No deformities, edema, or skin discoloration. Good capillary refill.   Skin:  Skin color, texture, turgor normal. No rashes or lesions.  Neurological Exam: MENTAL STATUS including orientation to time, place, person, recent and remote memory, attention span and concentration, language, and fund of knowledge is normal.  Speech is not dysarthric.  CRANIAL NERVES: II:  No visual field defects.  Unremarkable fundi.   III-IV-VI: Anisocoria with the left > right pupil, both are reactive and round. Normal conjugate, extra-ocular eye movements in all directions of gaze.  No nystagmus.  No ptosis.  V:  Normal facial sensation. VII:  Normal facial symmetry and movements.   VIII:  Normal hearing and vestibular function.   IX-X:  Normal  palatal movement.   XI:  Normal shoulder shrug and head rotation.   XII:  Normal tongue strength and range of motion, no deviation or fasciculation.  MOTOR: Intermittent giveaway weakness, with repeated testing and encouragement motor strength is 5/5 throughout both distally and proximally. No atrophy, fasciculations or abnormal movements.  No pronator drift.  Tone is normal.    MSRs:  Right                                                                 Left brachioradialis 2+  brachioradialis 2+  biceps 2+  biceps 2+  triceps 2+  triceps 2+  patellar 2+  patellar 2+  ankle jerk 1+  ankle jerk 1+  Hoffman no  Hoffman no  plantar response down  plantar response down   SENSORY: Patient denies feeling any vibration at any of the tested sites including the forehead, MCPs, knees, or ankles.  Pinprick is absent in the upper extremities were preserved in the lower extremities in a patchy distribution. Light touch and temperature is intact. Romberg's sign absent.   COORDINATION/GAIT: Normal finger-to- nose-finger and heel-to-shin.  Intact rapid alternating movements bilaterally.  Able to rise from a chair without using arms.  Gait narrow based and stable. She is unsteady but can perform tandem and stressed gait intact.    IMPRESSION: [name] is a 48 year-old female presenting for evaluation of whole body paresthesias. There are features of her neurological exam which are nonphysiological, including give way weakness and absent vibration at the forehead.  Overall, I do not find any focal abnormalities to suggest an underlying worrisome neurodegenerative condition. She  may have very mild distal and symmetric neuropathy based on reduced ankle jerks bilaterally, however this would not explain her generalized sensory loss.  Her previous workup was reviewed and has been extensive. MRI of the cervical spine, electrodiagnostic testing, and serology testing has been normal. Her vitamin B12 and vitamin B1  levels were low and she is currently taking B12 supplementation.  I had a very lengthy conversation with her and her husband that although she may have a very mild neuropathy, there is a huge overlay of underlying anxiety and depression which is most likely contributing to her multiple somatic complaints, which she acknowledges.  I strongly encouraged her to start seeing a psychologist and therapist for cognitive behavior therapy, which he will consider. In the meantime, she is having benefit with Lyrica 50 mg daily which can be continued. I explained that this is a very subtherapeutic dose for neuropathy, but since she is experiencing resolution of symptoms, it is reasonable to continue it.     PLAN/RECOMMENDATIONS:  1.  Start vitamin B1 131m daily 2.  Continue Lyrica 535mdaily 3.  Strongly recommended seeing psychologist and therapist for cognitive behavior therapy and coping mechanism 4.  Contact information provided for [facility].  Return to clinic in as needed  The duration of this appointment visit was 60 minutes of face-to-face time with the patient.  Greater than 50% of this time was spent in counseling, explanation of diagnosis, planning of further management, and coordination of care.   Thank you for allowing me to participate in patient's care.  If I can answer any additional questions, I would be pleased to do so.    Sincerely,    Colden Samaras K. PaPosey ProntoDO

## 2014-07-26 NOTE — Patient Instructions (Addendum)
1.  Continue Lyrica 50mg  daily 2.  Strongly recommended seeing psychologist and therapist for cognitive behavior therapy and coping mechanism 3.  Contact information provided for [facility] [place]

## 2014-08-02 ENCOUNTER — Other Ambulatory Visit: Payer: Self-pay | Admitting: Family Medicine

## 2014-08-18 ENCOUNTER — Other Ambulatory Visit: Payer: Self-pay | Admitting: Family Medicine

## 2014-08-18 ENCOUNTER — Telehealth: Payer: Self-pay | Admitting: Family Medicine

## 2014-08-18 DIAGNOSIS — F988 Other specified behavioral and emotional disorders with onset usually occurring in childhood and adolescence: Secondary | ICD-10-CM

## 2014-08-18 MED ORDER — LISDEXAMFETAMINE DIMESYLATE 70 MG PO CAPS: 70.0000 mg | ORAL_CAPSULE | Freq: Every day | ORAL | Status: DC

## 2014-08-18 MED ORDER — LISDEXAMFETAMINE DIMESYLATE 70 MG PO CAPS
70.0000 mg | ORAL_CAPSULE | Freq: Every day | ORAL | Status: DC
Start: 2014-08-18 — End: 2014-11-17

## 2014-08-18 NOTE — Telephone Encounter (Signed)
Msg left advising Rx ready for pick up.      KP 

## 2014-08-18 NOTE — Telephone Encounter (Signed)
Last seen 06/23/14. Please advise       KP

## 2014-08-18 NOTE — Telephone Encounter (Signed)
Refill printed 

## 2014-08-18 NOTE — Telephone Encounter (Signed)
Caller name: Marylene Landngela  Relation to pt: self  Call back [phone] [facility] [phone]   Reason for call: pt requesting refill VYVANSE pt is requesting MG to be change to 70MG  capsule  Pt would like a call when she can stop in and p/u rx

## 2014-08-26 ENCOUNTER — Other Ambulatory Visit: Payer: Self-pay | Admitting: Family Medicine

## 2014-09-05 ENCOUNTER — Ambulatory Visit (INDEPENDENT_AMBULATORY_CARE_PROVIDER_SITE_OTHER): Payer: 59 | Admitting: Family Medicine

## 2014-09-05 ENCOUNTER — Encounter: Payer: Self-pay | Admitting: Family Medicine

## 2014-09-05 ENCOUNTER — Telehealth: Payer: Self-pay | Admitting: *Deleted

## 2014-09-05 VITALS — BP 109/74 | HR 97 | Temp 98.0°F | Wt 196.9 lb

## 2014-09-05 DIAGNOSIS — F411 Generalized anxiety disorder: Secondary | ICD-10-CM

## 2014-09-05 MED ORDER — VENLAFAXINE HCL ER 37.5 MG PO CP24: ORAL_CAPSULE | ORAL | Status: DC

## 2014-09-05 MED ORDER — CLONAZEPAM 1 MG PO TABS: 1.0000 mg | ORAL_TABLET | Freq: Three times a day (TID) | ORAL | Status: DC

## 2014-09-05 NOTE — Progress Notes (Signed)
   Subjective:    Patient ID: [name], female    DOB: [birth year], 48 y.o.   MRN: [id number]  HPI Pt is here c/o increased anxiety and panic attacks since her son left home and went back to drugs and alcohol and is homeless.  She is not sleeping well and has frequent panic attacks.  She is not suicidal or homicidal.  Pt is crying a lot.     Review of Systems As above    Objective:   Physical Exam  BP 109/74  Pulse 97  Temp(Src) 98 F (36.7 C) (Oral)  Wt 196 lb 13.9 oz (89.3 kg)  SpO2 98% General appearance: alert, cooperative, appears stated age and no distress Extremities: extremities normal, atraumatic, no cyanosis or edema Neurologic: Mental status: Alert, oriented, thought content appropriate, when questioned about suicide, the patient expresses no suicidal ideation, no homicidal ideation        Assessment & Plan:  1. Generalized anxiety disorder Gave numbers and names of psych to make appointment Also suggested seeing Terri here rto 1 month or sooner prn - venlafaxine XR (EFFEXOR XR) 37.5 MG 24 hr capsule; 1 po qd x 1 week then 2 po qd  Dispense: 60 capsule; Refill: 0 - clonazePAM (KLONOPIN) 1 MG tablet; Take 1 tablet (1 mg total) by mouth 3 (three) times daily.  Dispense: 90 tablet; Refill: 0

## 2014-09-05 NOTE — Patient Instructions (Signed)
Generalized Anxiety Disorder Generalized anxiety disorder (GAD) is a mental disorder. It interferes with life functions, including relationships, work, and school. GAD is different from normal anxiety, which everyone experiences at some point in their lives in response to specific life events and activities. Normal anxiety actually helps us prepare for and get through these life events and activities. Normal anxiety goes away after the event or activity is over.  GAD causes anxiety that is not necessarily related to specific events or activities. It also causes excess anxiety in proportion to specific events or activities. The anxiety associated with GAD is also difficult to control. GAD can vary from mild to severe. People with severe GAD can have intense waves of anxiety with physical symptoms (panic attacks).  SYMPTOMS The anxiety and worry associated with GAD are difficult to control. This anxiety and worry are related to many life events and activities and also occur more days than not for 6 months or longer. People with GAD also have three or more of the following symptoms (one or more in children):  Restlessness.   Fatigue.  Difficulty concentrating.   Irritability.  Muscle tension.  Difficulty sleeping or unsatisfying sleep. DIAGNOSIS GAD is diagnosed through an assessment by your health care provider. Your health care provider will ask you questions aboutyour mood,physical symptoms, and events in your life. Your health care provider may ask you about your medical history and use of alcohol or drugs, including prescription medicines. Your health care provider may also do a physical exam and blood tests. Certain medical conditions and the use of certain substances can cause symptoms similar to those associated with GAD. Your health care provider may refer you to a mental health specialist for further evaluation. TREATMENT The following therapies are usually used to treat GAD:    Medication. Antidepressant medication usually is prescribed for long-term daily control. Antianxiety medicines may be added in severe cases, especially when panic attacks occur.   Talk therapy (psychotherapy). Certain types of talk therapy can be helpful in treating GAD by providing support, education, and guidance. A form of talk therapy called cognitive behavioral therapy can teach you healthy ways to think about and react to daily life events and activities.  Stress managementtechniques. These include yoga, meditation, and exercise and can be very helpful when they are practiced regularly. A mental health specialist can help determine which treatment is best for you. Some people see improvement with one therapy. However, other people require a combination of therapies. Document Released: 04/11/2013 Document Revised: 05/01/2014 Document Reviewed: 04/11/2013 ExitCare Patient Information 2015 [employer]. This information is not intended to replace advice given to you by your health care provider. Make sure you discuss any questions you have with your health care provider.  

## 2014-09-05 NOTE — Telephone Encounter (Signed)
C/o:  Multiple severe anxiety/panic attacks.  Not currently having a panic attack at time of call.  Klonopin prescribed.  Currently out.  According to patient, Scarlette Calico is not working.    Appt scheduled for today at 6:00 pm with Dr. Laury Axon.

## 2014-09-05 NOTE — Telephone Encounter (Signed)
[facility] Triage Call Report Triage Record Num: [id number] Operator: Elzie Rings Patient Name: [name] Call Date & Time: 09/03/2014 1:11:04PM Patient Phone: [phone] PCP: Patient Gender: Female PCP Fax : Patient DOB: 08/11/1966 Practice Name: [facility] Reason for Call: Caller: Ladora/Patient; PCP: Lelon Perla.; CB#: [phone]; Call regarding Medication Issue; Medication(s): Klonipin; Pharmacy called and pt should have at least three days left based on last fill date. PT states she has been doubling up due to situation with her son being on drugs and homeless. Triaged per Anxiey: Panic attack Guideline. See provider within 24 hours for "experiencing multiple daily episodes of severe anxiety or panic attacks". PT given care advice including being seen at urgent care of ED since office is closed on 09/04/14. PT verbalized understanding. No refill given. Protocol(s) Used: Anxiety: Panic Recommended Outcome per Protocol: See Provider within 24 hours Reason for Outcome: Experiencing multiple daily episodes of severe anxiety or panic Care Advice: ~ List, or take, all current prescription(s), nonprescription or alternative medication(s) to provider for evaluation. ~ Call provider or mental healthcare provider if symptoms worsen or new symptoms develop.

## 2014-09-05 NOTE — Progress Notes (Signed)
Pre visit review using our clinic review tool, if applicable. No additional management support is needed unless otherwise documented below in the visit note. 

## 2014-09-26 ENCOUNTER — Other Ambulatory Visit: Payer: Self-pay

## 2014-09-26 MED ORDER — "SYRINGE/NEEDLE (DISP) 25G X 1"" 3 ML MISC": 10.0000 | Status: DC

## 2014-09-29 ENCOUNTER — Ambulatory Visit: Payer: Self-pay | Admitting: Family Medicine

## 2014-10-05 ENCOUNTER — Telehealth: Payer: Self-pay | Admitting: Family Medicine

## 2014-10-05 DIAGNOSIS — I10 Essential (primary) hypertension: Secondary | ICD-10-CM

## 2014-10-05 MED ORDER — LISINOPRIL-HYDROCHLOROTHIAZIDE 10-12.5 MG PO TABS
1.0000 | ORAL_TABLET | Freq: Every day | ORAL | Status: DC
Start: 2014-10-05 — End: 2016-04-23

## 2014-10-05 NOTE — Telephone Encounter (Signed)
Caller name: Thayer OhmChris Relation to pt: pharm tech Call back number: [phone] [facility] [place]  Reason for call: pt is needing a refill on Rx lisinopril-hydrochlorothiazide (PRINZIDE,ZESTORETIC) 10-12.5 MG per tablet [[id number]][[id number]] DISCONTINUED; phone call came from Pharmacy, they had sent over 3 requests.  Thanks.

## 2014-10-09 ENCOUNTER — Telehealth: Payer: Self-pay | Admitting: Family Medicine

## 2014-10-09 DIAGNOSIS — F411 Generalized anxiety disorder: Secondary | ICD-10-CM

## 2014-10-09 MED ORDER — CLONAZEPAM 1 MG PO TABS: 1.0000 mg | ORAL_TABLET | Freq: Three times a day (TID) | ORAL | Status: DC

## 2014-10-09 NOTE — Telephone Encounter (Signed)
Detailed message advising Rx ready for pick up in the office       KP

## 2014-10-09 NOTE — Telephone Encounter (Signed)
Klonopin refill request  Last seen and filled 09/05/14 #90 No UDS on file  Please advise     KP

## 2014-10-09 NOTE — Telephone Encounter (Signed)
Advised pt that the Rx has to be picked up in office.  Can not be faxed.   Kim also made pt aware - Rx has to be picked up in office, no exception.  UDS is needed.

## 2014-10-09 NOTE — Telephone Encounter (Signed)
Klonopin 1 mg [facility] [place]

## 2014-10-09 NOTE — Telephone Encounter (Signed)
Ok for #90, needs UDS

## 2014-10-17 ENCOUNTER — Other Ambulatory Visit: Payer: Self-pay

## 2014-10-17 NOTE — Telephone Encounter (Signed)
Duplicate request. Rx faxed on 10/09/14.    KP

## 2014-10-20 ENCOUNTER — Telehealth: Payer: Self-pay | Admitting: Family Medicine

## 2014-10-20 NOTE — Telephone Encounter (Signed)
Caller name: Chenise Relation to pt: self Call back number: [phone] Pharmacy:  Reason for call:   Patient requesting a new vyvance rx

## 2014-10-20 NOTE — Telephone Encounter (Signed)
Rx was given on 08/18/14 for 3 mos, the patient said she would go an look for it.      KP

## 2014-10-23 ENCOUNTER — Ambulatory Visit: Payer: Self-pay | Admitting: Family Medicine

## 2014-10-25 ENCOUNTER — Encounter: Payer: Self-pay | Admitting: Family Medicine

## 2014-11-02 ENCOUNTER — Telehealth: Payer: Self-pay | Admitting: Family Medicine

## 2014-11-02 DIAGNOSIS — F411 Generalized anxiety disorder: Secondary | ICD-10-CM

## 2014-11-02 MED ORDER — VENLAFAXINE HCL ER 37.5 MG PO CP24: ORAL_CAPSULE | ORAL | Status: DC

## 2014-11-02 NOTE — Telephone Encounter (Signed)
Caller name: [facility]  Relation to pt: Call back number: [phone] fax: [phone] Pharmacy:  Reason for call:   Theodoro GristDave states that patient is requesting a refill of venlafaxine

## 2014-11-07 ENCOUNTER — Telehealth: Payer: Self-pay

## 2014-11-07 ENCOUNTER — Other Ambulatory Visit: Payer: Self-pay | Admitting: Family Medicine

## 2014-11-07 DIAGNOSIS — F411 Generalized anxiety disorder: Secondary | ICD-10-CM

## 2014-11-07 MED ORDER — CLONAZEPAM 1 MG PO TABS: 1.0000 mg | ORAL_TABLET | Freq: Three times a day (TID) | ORAL | Status: DC

## 2014-11-07 NOTE — Telephone Encounter (Signed)
Last seen 09/05/14 and 10/09/14 #90 No UDS   Please advise    KP

## 2014-11-07 NOTE — Telephone Encounter (Addendum)
[name] [phone] [facility] [place]  Kyira needs a refill on her clonazePAM (KLONOPIN) 1 MG tablet [facility] [place], she is completely out, she says she really needs it.

## 2014-11-07 NOTE — Telephone Encounter (Signed)
Duplicate   KP 

## 2014-11-17 ENCOUNTER — Telehealth: Payer: Self-pay | Admitting: Family Medicine

## 2014-11-17 DIAGNOSIS — F988 Other specified behavioral and emotional disorders with onset usually occurring in childhood and adolescence: Secondary | ICD-10-CM

## 2014-11-17 MED ORDER — LISDEXAMFETAMINE DIMESYLATE 70 MG PO CAPS: 70.0000 mg | ORAL_CAPSULE | Freq: Every day | ORAL | Status: DC

## 2014-11-17 MED ORDER — LISDEXAMFETAMINE DIMESYLATE 60 MG PO CAPS: 60.0000 mg | ORAL_CAPSULE | Freq: Every day | ORAL | Status: DC

## 2014-11-17 MED ORDER — CYANOCOBALAMIN 1000 MCG/ML IJ SOLN: INTRAMUSCULAR | Status: DC

## 2014-11-17 NOTE — Telephone Encounter (Signed)
Patient states that she needs this by the end of the day. Patient states that she will be on vacation next week. Best # [phone]

## 2014-11-17 NOTE — Telephone Encounter (Signed)
Caller name: [name]  Pt came in office requesting for rx   lisdexamfetamine (VYVANSE) 70 MG capsule  And needing rx for cyanocobalamin (,VITAMIN B-12,) 1000 MCG/ML injection. Pt states has been trying to call to ask for rx did not get any answer. Pt is here waiting for the rx.  Please advice.

## 2014-11-17 NOTE — Telephone Encounter (Signed)
Rx sent      KP 

## 2014-11-17 NOTE — Telephone Encounter (Signed)
Patient also needs [facility]12 serum [facility] [place] [place]

## 2014-12-07 ENCOUNTER — Telehealth: Payer: Self-pay | Admitting: Family Medicine

## 2014-12-07 DIAGNOSIS — F411 Generalized anxiety disorder: Secondary | ICD-10-CM

## 2014-12-07 MED ORDER — CLONAZEPAM 1 MG PO TABS: 1.0000 mg | ORAL_TABLET | Freq: Three times a day (TID) | ORAL | Status: DC

## 2014-12-07 NOTE — Telephone Encounter (Signed)
Rx faxed.    KP 

## 2014-12-07 NOTE — Telephone Encounter (Signed)
Caller name: Journee Relation to pt: self Call back number: [phone] [facility]  Reason for call:   Patient requesting a refill of klonipin

## 2014-12-07 NOTE — Telephone Encounter (Signed)
Refill x1 

## 2014-12-07 NOTE — Telephone Encounter (Signed)
Last seen 09/05/14 and filled 11/07/14 #90  Please advise      KP

## 2015-01-08 ENCOUNTER — Telehealth: Payer: Self-pay | Admitting: Family Medicine

## 2015-01-08 NOTE — Telephone Encounter (Signed)
Pt requesting a refill pregabalin (LYRICA) 50 MG capsule

## 2015-01-08 NOTE — Telephone Encounter (Signed)
Last seen 09/05/14 and filled 06/27/14 #90 NO UDS   Please advise     KP

## 2015-01-08 NOTE — Telephone Encounter (Signed)
Refill for 6 months--- no UDS needed

## 2015-01-09 MED ORDER — PREGABALIN 50 MG PO CAPS: 50.0000 mg | ORAL_CAPSULE | Freq: Two times a day (BID) | ORAL | Status: DC

## 2015-01-09 NOTE — Telephone Encounter (Signed)
Rx faxed.    KP 

## 2015-01-10 ENCOUNTER — Other Ambulatory Visit: Payer: Self-pay | Admitting: Family Medicine

## 2015-01-10 NOTE — Telephone Encounter (Signed)
Patient states that pharmacy did not receive this. Please refax.

## 2015-01-10 NOTE — Telephone Encounter (Signed)
Patient requesting Klonopin 1mg  (takes TID PRN).  Last refilled 12/07/14 for 90 with 0.   Last office visit 07/26/14. Last UDS 10/09/14- low risk.    Please advise.    EAL

## 2015-01-11 ENCOUNTER — Telehealth: Payer: Self-pay | Admitting: Family Medicine

## 2015-01-11 MED ORDER — CLONAZEPAM 1 MG PO TABS: ORAL_TABLET | ORAL | Status: DC

## 2015-01-11 NOTE — Telephone Encounter (Signed)
Rx has been faxed multiple times with confirmation. phoned in.      [place]

## 2015-01-11 NOTE — Telephone Encounter (Signed)
Per Selena BattenKim, Rx previously faxed.

## 2015-01-11 NOTE — Addendum Note (Signed)
Addended by: Noreene LarssonLARSON, Yanelly Cantrelle A on: 01/11/2015 11:01 AM   Modules accepted: Orders

## 2015-01-11 NOTE — Telephone Encounter (Signed)
Patient called in stating that she was not given a hard copy of this rx and would like refill called in. Requesting callback. Patient is out.

## 2015-02-08 ENCOUNTER — Other Ambulatory Visit: Payer: Self-pay | Admitting: Family Medicine

## 2015-02-08 NOTE — Telephone Encounter (Signed)
Last seen 09/05/14 and filled 01/11/14 #90 UDS 10/09/14 low risk    Please advise     KP

## 2015-02-21 ENCOUNTER — Telehealth: Payer: Self-pay | Admitting: Family Medicine

## 2015-02-21 DIAGNOSIS — F988 Other specified behavioral and emotional disorders with onset usually occurring in childhood and adolescence: Secondary | ICD-10-CM

## 2015-02-21 NOTE — Telephone Encounter (Signed)
Caller name: Joretta BachelorHuddy, Estefani G Relation to pt: self  Call back number: [phone]   Reason for call:  Pt requesting a rx for lisdexamfetamine (VYVANSE) 60 MG capsule

## 2015-02-22 MED ORDER — LISDEXAMFETAMINE DIMESYLATE 70 MG PO CAPS: 70.0000 mg | ORAL_CAPSULE | Freq: Every day | ORAL | Status: DC

## 2015-02-22 NOTE — Telephone Encounter (Signed)
Patient aware Rx ready for pick up.      KP 

## 2015-03-06 ENCOUNTER — Other Ambulatory Visit: Payer: Self-pay | Admitting: Family Medicine

## 2015-03-06 NOTE — Telephone Encounter (Signed)
Last seen 09/05/14 and filled 02/08/15 #90 UDS 10/09/14 low risk    Please advise       KP

## 2015-03-21 ENCOUNTER — Telehealth: Payer: Self-pay | Admitting: Family Medicine

## 2015-03-21 DIAGNOSIS — F988 Other specified behavioral and emotional disorders with onset usually occurring in childhood and adolescence: Secondary | ICD-10-CM

## 2015-03-21 MED ORDER — LISDEXAMFETAMINE DIMESYLATE 70 MG PO CAPS
70.0000 mg | ORAL_CAPSULE | Freq: Every day | ORAL | Status: DC
Start: 2015-03-21 — End: 2015-04-02

## 2015-03-21 NOTE — Telephone Encounter (Signed)
Caller name:Prashad Teva Relation to ZO:XWRUpt:self Call back [phone] Pharmacy:  Reason for call: pt is needing rx   lisdexamfetamine (VYVANSE) 70 MG capsule      Please call when available and ready for pick up

## 2015-03-21 NOTE — Telephone Encounter (Signed)
VM left advising Rx will be ready for pick up tomorrow.     KP 

## 2015-04-02 ENCOUNTER — Encounter: Payer: Self-pay | Admitting: Family Medicine

## 2015-04-02 ENCOUNTER — Ambulatory Visit (INDEPENDENT_AMBULATORY_CARE_PROVIDER_SITE_OTHER): Payer: 59 | Admitting: Family Medicine

## 2015-04-02 VITALS — BP 134/86 | HR 100 | Temp 97.6°F | Wt 225.2 lb

## 2015-04-02 DIAGNOSIS — F909 Attention-deficit hyperactivity disorder, unspecified type: Secondary | ICD-10-CM

## 2015-04-02 DIAGNOSIS — R8290 Unspecified abnormal findings in urine: Secondary | ICD-10-CM

## 2015-04-02 DIAGNOSIS — N951 Menopausal and female climacteric states: Secondary | ICD-10-CM

## 2015-04-02 DIAGNOSIS — R232 Flushing: Secondary | ICD-10-CM

## 2015-04-02 DIAGNOSIS — M2550 Pain in unspecified joint: Secondary | ICD-10-CM | POA: Insufficient documentation

## 2015-04-02 DIAGNOSIS — F988 Other specified behavioral and emotional disorders with onset usually occurring in childhood and adolescence: Secondary | ICD-10-CM

## 2015-04-02 DIAGNOSIS — M2540 Effusion, unspecified joint: Secondary | ICD-10-CM

## 2015-04-02 LAB — HEPATIC FUNCTION PANEL
ALK PHOS: 72 U/L (ref 39–117)
ALT: 17 U/L (ref 0–35)
AST: 15 U/L (ref 0–37)
Albumin: 3.4 g/dL — ABNORMAL LOW (ref 3.5–5.2)
BILIRUBIN TOTAL: 1.1 mg/dL (ref 0.2–1.2)
Bilirubin, Direct: 0.2 mg/dL (ref 0.0–0.3)
Total Protein: 6.6 g/dL (ref 6.0–8.3)

## 2015-04-02 LAB — POCT URINALYSIS DIPSTICK
Glucose, UA: NEGATIVE
Leukocytes, UA: NEGATIVE
Nitrite, UA: POSITIVE
RBC UA: NEGATIVE
UROBILINOGEN UA: 0.2
pH, UA: 5.5

## 2015-04-02 LAB — TSH: TSH: 2.65 u[IU]/mL (ref 0.35–4.50)

## 2015-04-02 LAB — CBC WITH DIFFERENTIAL/PLATELET
BASOS PCT: 0.2 % (ref 0.0–3.0)
Basophils Absolute: 0 10*3/uL (ref 0.0–0.1)
EOS PCT: 1.7 % (ref 0.0–5.0)
Eosinophils Absolute: 0.2 10*3/uL (ref 0.0–0.7)
HCT: 46.3 % — ABNORMAL HIGH (ref 36.0–46.0)
HEMOGLOBIN: 15.6 g/dL — AB (ref 12.0–15.0)
LYMPHS PCT: 27 % (ref 12.0–46.0)
Lymphs Abs: 2.9 10*3/uL (ref 0.7–4.0)
MCHC: 33.6 g/dL (ref 30.0–36.0)
MCV: 90.9 fl (ref 78.0–100.0)
MONOS PCT: 4.3 % (ref 3.0–12.0)
Monocytes Absolute: 0.5 10*3/uL (ref 0.1–1.0)
Neutro Abs: 7.2 10*3/uL (ref 1.4–7.7)
Neutrophils Relative %: 66.8 % (ref 43.0–77.0)
Platelets: 286 10*3/uL (ref 150.0–400.0)
RBC: 5.09 Mil/uL (ref 3.87–5.11)
RDW: 14.2 % (ref 11.5–15.5)
WBC: 10.8 10*3/uL — AB (ref 4.0–10.5)

## 2015-04-02 LAB — BASIC METABOLIC PANEL
BUN: 9 mg/dL (ref 6–23)
CO2: 25 mEq/L (ref 19–32)
Calcium: 9.1 mg/dL (ref 8.4–10.5)
Chloride: 101 mEq/L (ref 96–112)
Creatinine, Ser: 0.76 mg/dL (ref 0.40–1.20)
GFR: 85.65 mL/min (ref >60.00)
Glucose, Bld: 103 mg/dL — ABNORMAL HIGH (ref 70–99)
Potassium: 3.4 mEq/L — ABNORMAL LOW (ref 3.5–5.1)
SODIUM: 136 meq/L (ref 135–145)

## 2015-04-02 LAB — SEDIMENTATION RATE: Sed Rate: 10 mm/hr (ref 0–22)

## 2015-04-02 LAB — T3, FREE: T3, Free: 3 pg/mL (ref 2.3–4.2)

## 2015-04-02 LAB — T4, FREE: Free T4: 0.92 ng/dL (ref 0.60–1.60)

## 2015-04-02 MED ORDER — PREDNISONE 10 MG PO TABS: ORAL_TABLET | ORAL | Status: DC

## 2015-04-02 MED ORDER — LISDEXAMFETAMINE DIMESYLATE 70 MG PO CAPS: 70.0000 mg | ORAL_CAPSULE | Freq: Every day | ORAL | Status: DC

## 2015-04-02 NOTE — Progress Notes (Signed)
Pre visit review using our clinic review tool, if applicable. No additional management support is needed unless otherwise documented below in the visit note. 

## 2015-04-02 NOTE — Progress Notes (Signed)
Patient ID: [name], female    DOB: 12/12/1966  Age: 49 y.o. MRN: [id number]    Subjective:  Subjective HPI TWISHA VANPELT presents c/o joint swelling and severe joint pains that have worsened since last ov.  No injury.  Pt requesting pain management.   Review of Systems  Constitutional: Positive for fatigue. Negative for activity change, appetite change and unexpected weight change.  HENT: Positive for ear pain. Negative for congestion, facial swelling, hearing loss, mouth sores, rhinorrhea, sinus pressure, sneezing and sore throat.   Respiratory: Negative for cough and shortness of breath.   Cardiovascular: Negative for chest pain and palpitations.  Musculoskeletal: Positive for myalgias, joint swelling, arthralgias and gait problem.  Neurological: Negative for dizziness, tremors, seizures, syncope, facial asymmetry, speech difficulty, weakness, light-headedness, numbness and headaches.  Psychiatric/Behavioral: Positive for dysphoric mood. Negative for behavioral problems. The patient is not nervous/anxious.     History Past Medical History  Diagnosis Date  . Panic disorder   . Extremity numbness     legs, feet, arms, hands  . Foot fracture     bilateral  . Frequent falls   . Vision problems   . Hair loss   . Memory loss   . Hypotension   . Abdominal pain   . Leg pain     when driving long distances  . Bruises easily   . Kidney stones   . Arthritis   . Anemia   . Irritable bowel   . Depression   . Migraines     She has past surgical history that includes Cholecystectomy; Laparoscopic gastric banding; Foot surgery (Left); Abdominoplasty; Abdominal hysterectomy; and Tonsillectomy.   Her family history includes Deep vein thrombosis in her mother; Diabetes in her maternal grandmother; Heart attack in her father; Heart disease in her maternal grandmother; Heart disease (age of onset: 55) in her maternal grandfather; Hypertension in her father and mother; Panic disorder  in her mother.She reports that she has been smoking.  She has never used smokeless tobacco. She reports that she does not drink alcohol or use illicit drugs.  Current Outpatient Prescriptions on File Prior to Visit  Medication Sig Dispense Refill  . atorvastatin (LIPITOR) 10 MG tablet TAKE 1 TABLET BY MOUTH EVERY DAY 30 tablet 2  . budesonide-formoterol (SYMBICORT) 160-4.5 MCG/ACT inhaler Inhale 2 puffs into the lungs 2 (two) times daily. 1 Inhaler 12  . clonazePAM (KLONOPIN) 1 MG tablet TAKE 1 TABLET BY MOUTH 3 TIMES A DAY AS NEEDED 90 tablet 0  . cyanocobalamin (,VITAMIN B-12,) 1000 MCG/ML injection INJECT 1 ML (1,000 MCG TOTAL) INTO THE MUSCLE EVERY 30 (THIRTY) DAYS. 1 mL 5  . lisinopril-hydrochlorothiazide (PRINZIDE,ZESTORETIC) 10-12.5 MG per tablet Take 1 tablet by mouth daily. 90 tablet 1  . ONE TOUCH ULTRA TEST test strip TEST 3 TIMES A DAY 100 each 11  . pregabalin (LYRICA) 50 MG capsule Take 1 capsule (50 mg total) by mouth 2 (two) times daily. 60 capsule 5  . SYRINGE-NEEDLE, DISP, 3 ML 25G X 1" 3 ML MISC 10 each by Does not apply route as directed. 10 each 2  . venlafaxine XR (EFFEXOR XR) 37.5 MG 24 hr capsule 2 po qd 60 capsule 5  . metFORMIN (GLUCOPHAGE-XR) 500 MG 24 hr tablet TAKE 1 TABLET BY MOUTH IN THE MORNING AND TAKE 2 TABLETS BY MOUTH IN THE EVENING (Patient not taking: Reported on 04/02/2015) 90 tablet 5   No current facility-administered medications on file prior to visit.  Objective:  Objective Physical Exam  Constitutional: She is oriented to person, place, and time. She appears well-developed and well-nourished. No distress.  HENT:  Right Ear: External ear normal.  Left Ear: External ear normal.  Nose: Nose normal.  Mouth/Throat: Oropharynx is clear and moist.  Eyes: EOM are normal. Pupils are equal, round, and reactive to light.  Neck: Normal range of motion. Neck supple.  Cardiovascular: Normal rate, regular rhythm and normal heart sounds.   No murmur  heard. Pulmonary/Chest: Effort normal and breath sounds normal. No respiratory distress. She has no wheezes. She has no rales. She exhibits no tenderness.  Musculoskeletal: She exhibits edema and tenderness.       Right ankle: She exhibits decreased range of motion and swelling. Tenderness.       Left ankle: She exhibits decreased range of motion and swelling. Tenderness.       Right upper leg: She exhibits tenderness. She exhibits no swelling.       Left upper leg: She exhibits tenderness. She exhibits no swelling.       Legs: Neurological: She is alert and oriented to person, place, and time.  Psychiatric: Her behavior is normal. Judgment and thought content normal. She exhibits a depressed mood.   BP 134/86 mmHg  Pulse 100  Temp(Src) 97.6 F (36.4 C) (Oral)  Wt 225 lb 3.2 oz (102.15 kg)  SpO2 96% Wt Readings from Last 3 Encounters:  04/02/15 225 lb 3.2 oz (102.15 kg)  09/05/14 196 lb 13.9 oz (89.3 kg)  07/26/14 198 lb 6 oz (89.982 kg)     Lab Results  Component Value Date   WBC 8.7 02/06/2014   HGB 13.9 02/06/2014   HCT 41.9 02/06/2014   PLT 252.0 02/06/2014   GLUCOSE 81 06/23/2014   CHOL 182 06/23/2014   TRIG 163* 06/23/2014   HDL 53 06/23/2014   LDLDIRECT 121.4 07/04/2013   LDLCALC 96 06/23/2014   ALT 8 06/23/2014   AST 11 06/23/2014   NA 139 06/23/2014   K 3.8 06/23/2014   CL 101 06/23/2014   CREATININE 0.78 06/23/2014   BUN 9 06/23/2014   CO2 28 06/23/2014   TSH 1.244 09/02/2013   INR 0.97 06/23/2014   HGBA1C 5.7* 06/23/2014   MICROALBUR 0.89 11/04/2013    Dg Chest 2 View  09/27/2013   *RADIOLOGY REPORT*  Clinical Data: Shortness of breath, edema of the hands and feet, evaluate for congestive heart failure, history smoking and hypertension, initial encounter.  CHEST - 2 VIEW  Comparison: 06/16/2013; 01/01/2012; Chest CT - 01/01/2012  Findings:  Grossly unchanged cardiac silhouette and mediastinal contours.  No focal airspace opacity.  No pleural effusion or  pneumothorax.  No evidence of edema.  Grossly unchanged bones.  Post cholecystectomy.  IMPRESSION: No acute cardiopulmonary disease, specifically, no evidence of pulmonary edema.   Original Report Authenticated By: Jake Seats, MD     Assessment & Plan:  Plan I am having Ms. Volkert start on predniSONE, lisdexamfetamine, and lisdexamfetamine. I am also having her maintain her budesonide-formoterol, metFORMIN, atorvastatin, ONE TOUCH ULTRA TEST, SYRINGE-NEEDLE (DISP) 3 ML, lisinopril-hydrochlorothiazide, venlafaxine XR, cyanocobalamin, pregabalin, clonazePAM, and lisdexamfetamine.  Meds ordered this encounter  Medications  . predniSONE (DELTASONE) 10 MG tablet    Sig: 3 po qd for 3 days then 2 po qd for 3 days the 1 po qd for 3 days    Dispense:  18 tablet    Refill:  0  . lisdexamfetamine (VYVANSE) 70 MG capsule  Sig: Take 1 capsule (70 mg total) by mouth daily.    Dispense:  30 capsule    Refill:  0  . lisdexamfetamine (VYVANSE) 70 MG capsule    Sig: Take 1 capsule (70 mg total) by mouth daily.    Dispense:  30 capsule    Refill:  0    Do not fill until May 02, 2015  . lisdexamfetamine (VYVANSE) 70 MG capsule    Sig: Take 1 capsule (70 mg total) by mouth daily.    Dispense:  30 capsule    Refill:  0    Do not fill until June 02, 2015    Problem List Items Addressed This Visit    Multiple joint pain    With swelling -- pain is severe Pt having trouble walking Check labs--- PMR? Or RA? Refer to rheum  pred taper rto prn      Relevant Medications   predniSONE (DELTASONE) tablet   Other Relevant Orders   Basic metabolic panel   CBC with Differential/Platelet   Hepatic function panel   TSH   Vitamin D 1,25 dihydroxy   Sed Rate (ESR)   Rheumatoid factor   ANA   Ambulatory referral to Rheumatology   T3, free   T4, free   Vitamin B12    Other Visit Diagnoses    Joint swelling    -  Primary    Relevant Medications    predniSONE (DELTASONE) tablet    Other Relevant  Orders    Basic metabolic panel    CBC with Differential/Platelet    Hepatic function panel    TSH    Vitamin D 1,25 dihydroxy    Sed Rate (ESR)    Rheumatoid factor    ANA    Ambulatory referral to Rheumatology    T3, free    T4, free    Vitamin B12    Hot flashes        Relevant Orders    Estradiol    FSH    LH    T3, free    T4, free    Vitamin B12    ADD (attention deficit disorder)        Relevant Medications    lisdexamfetamine (VYVANSE) capsule    lisdexamfetamine (VYVANSE) capsule    lisdexamfetamine (VYVANSE) capsule       Follow-up: Return if symptoms worsen or fail to improve.  Garnet Koyanagi, DO

## 2015-04-02 NOTE — Patient Instructions (Signed)

## 2015-04-02 NOTE — Addendum Note (Signed)
Addended by: Eustace QuailEABOLD, Maynard Garland Smouse J on: 04/02/2015 01:27 PM   Modules accepted: Orders

## 2015-04-02 NOTE — Assessment & Plan Note (Signed)
With swelling -- pain is severe Pt having trouble walking Check labs--- PMR? Or RA? Refer to rheum  pred taper rto prn

## 2015-04-03 ENCOUNTER — Telehealth: Payer: Self-pay | Admitting: Family Medicine

## 2015-04-03 LAB — FOLLICLE STIMULATING HORMONE: FSH: 9.1 m[IU]/mL

## 2015-04-03 LAB — HEPATIC FUNCTION PANEL
ALT: 16 U/L (ref 0–35)
AST: 15 U/L (ref 0–37)
Albumin: 3.6 g/dL (ref 3.5–5.2)
Alkaline Phosphatase: 70 U/L (ref 39–117)
BILIRUBIN DIRECT: 0.2 mg/dL (ref 0.0–0.3)
BILIRUBIN TOTAL: 1 mg/dL (ref 0.2–1.2)
Indirect Bilirubin: 0.8 mg/dL (ref 0.2–1.2)
Total Protein: 6.7 g/dL (ref 6.0–8.3)

## 2015-04-03 LAB — CBC WITH DIFFERENTIAL/PLATELET

## 2015-04-03 LAB — BASIC METABOLIC PANEL
BUN: 9 mg/dL (ref 6–23)
CALCIUM: 8.8 mg/dL (ref 8.4–10.5)
CO2: 19 meq/L (ref 19–32)
Chloride: 103 mEq/L (ref 96–112)
Creat: 0.78 mg/dL (ref 0.50–1.10)
Glucose, Bld: 114 mg/dL — ABNORMAL HIGH (ref 70–99)
Potassium: 3.8 mEq/L (ref 3.5–5.3)
SODIUM: 142 meq/L (ref 135–145)

## 2015-04-03 LAB — RHEUMATOID FACTOR: Rhuematoid fact SerPl-aCnc: <10 IU/mL (ref <=14)

## 2015-04-03 LAB — SEDIMENTATION RATE

## 2015-04-03 LAB — T3, FREE: T3, Free: 3 pg/mL (ref 2.3–4.2)

## 2015-04-03 LAB — TSH: TSH: 2.498 u[IU]/mL (ref 0.350–4.500)

## 2015-04-03 LAB — VITAMIN B12
Vitamin B-12: 845 pg/mL (ref 211–911)
Vitamin B-12: 905 pg/mL (ref 211–911)

## 2015-04-03 LAB — T4, FREE: FREE T4: 1.1 ng/dL (ref 0.80–1.80)

## 2015-04-03 LAB — ANA: Anti Nuclear Antibody(ANA): NEGATIVE

## 2015-04-03 LAB — LUTEINIZING HORMONE: LH: 1.98 m[IU]/mL

## 2015-04-03 NOTE — Telephone Encounter (Signed)
emmi emailed °

## 2015-04-04 ENCOUNTER — Telehealth: Payer: Self-pay | Admitting: Family Medicine

## 2015-04-04 LAB — LUTEINIZING HORMONE: LH: 1.8 m[IU]/mL

## 2015-04-04 LAB — FOLLICLE STIMULATING HORMONE: FSH: 9.6 m[IU]/mL

## 2015-04-04 NOTE — Telephone Encounter (Signed)
Last seen 04/02/15 and filled 03/06/15 #90 UDS 10/09/14 low risk   Please advise     KP

## 2015-04-05 ENCOUNTER — Telehealth: Payer: Self-pay | Admitting: *Deleted

## 2015-04-05 ENCOUNTER — Other Ambulatory Visit: Payer: Self-pay | Admitting: Family Medicine

## 2015-04-05 DIAGNOSIS — N390 Urinary tract infection, site not specified: Secondary | ICD-10-CM

## 2015-04-05 LAB — URINE CULTURE: Colony Count: >=100000

## 2015-04-05 MED ORDER — PREGABALIN 50 MG PO CAPS: 50.0000 mg | ORAL_CAPSULE | Freq: Two times a day (BID) | ORAL | Status: DC

## 2015-04-05 MED ORDER — CIPROFLOXACIN HCL 500 MG PO TABS: 500.0000 mg | ORAL_TABLET | Freq: Two times a day (BID) | ORAL | Status: DC

## 2015-04-05 NOTE — Telephone Encounter (Signed)
Offered to send Lyrica to another pharmacy, patient agreed, but stated that it is not helping anyway.   Would you like her to come in for office visit?  She is unsure of what she wants, just getting very anxious about the problem.

## 2015-04-05 NOTE — Telephone Encounter (Signed)
[facility] states that they are requesting this, not [facility]. Best # [id number]

## 2015-04-05 NOTE — Telephone Encounter (Addendum)
[facility], [facility] is not in her chart.      KP

## 2015-04-05 NOTE — Telephone Encounter (Signed)
I have not gotten anything from Dr Kellie Simmeringruslow yet--- so I can't see the labs.  whats going on with the lyrica-- anything we can do to help with that?

## 2015-04-05 NOTE — Telephone Encounter (Signed)
Increase lyrica 50 mg tid --- and we can inc from there Yes--- ov tomorrow

## 2015-04-05 NOTE — Telephone Encounter (Signed)
Spoke with patient and she said she was having some issues with [facility] and would like the request to go to [facility].     KP

## 2015-04-05 NOTE — Telephone Encounter (Signed)
Rx reprinted and awaiting MD signature.

## 2015-04-05 NOTE — Telephone Encounter (Signed)
Patient was seen 04/02/15 for ankle swelling- referred to Dr. Kellie Simmeringruslow.  She had appointment with him and they took x-rays and "all kinds of tests."  He said "everything is just lovely" with labs.  Patient states she is unable to walk due to pain.  She feels like they are hot and they are always swollen, they are getting worse.   She has not been able to get the Lyrica (states [facility]   She would like for you to look at the labs and see if there is anything you can find. She states she does not know what to do.    Please advise.

## 2015-04-06 ENCOUNTER — Ambulatory Visit (INDEPENDENT_AMBULATORY_CARE_PROVIDER_SITE_OTHER): Payer: 59 | Admitting: Family Medicine

## 2015-04-06 ENCOUNTER — Encounter: Payer: Self-pay | Admitting: Family Medicine

## 2015-04-06 VITALS — BP 154/96 | HR 103 | Temp 97.7°F | Resp 18 | Ht 66.0 in | Wt 224.0 lb

## 2015-04-06 DIAGNOSIS — G894 Chronic pain syndrome: Secondary | ICD-10-CM

## 2015-04-06 LAB — VITAMIN D 1,25 DIHYDROXY
VITAMIN D3 1, 25 (OH): 34 pg/mL
Vitamin D 1, 25 (OH)2 Total: 34 pg/mL (ref 18–72)

## 2015-04-06 MED ORDER — PREGABALIN 50 MG PO CAPS: ORAL_CAPSULE | ORAL | Status: DC

## 2015-04-06 NOTE — Progress Notes (Signed)
Pre visit review using our clinic review tool, if applicable. No additional management support is needed unless otherwise documented below in the visit note. 

## 2015-04-06 NOTE — Patient Instructions (Signed)

## 2015-04-06 NOTE — Progress Notes (Signed)
Subjective:    Patient ID: Joretta Bachelorngela G Mcgaha, female    DOB: 04/16/1966, 49 y.o.   MRN: [id number]  HPI  Patient here for f/u pain and swelling   Past Medical History  Diagnosis Date  . Panic disorder   . Extremity numbness     legs, feet, arms, hands  . Foot fracture     bilateral  . Frequent falls   . Vision problems   . Hair loss   . Memory loss   . Hypotension   . Abdominal pain   . Leg pain     when driving long distances  . Bruises easily   . Kidney stones   . Arthritis   . Anemia   . Irritable bowel   . Depression   . Migraines     Review of Systems  Constitutional: Negative for activity change, appetite change, fatigue and unexpected weight change.  Respiratory: Negative for cough and shortness of breath.   Cardiovascular: Negative for chest pain and palpitations.  Psychiatric/Behavioral: Negative for behavioral problems and dysphoric mood. The patient is not nervous/anxious.     Current Outpatient Prescriptions on File Prior to Visit  Medication Sig Dispense Refill  . clonazePAM (KLONOPIN) 1 MG tablet TAKE 1 TABLET BY MOUTH 3 TIMES A DAY AS NEEDED. 90 tablet 0  . cyanocobalamin (,VITAMIN B-12,) 1000 MCG/ML injection INJECT 1 ML (1,000 MCG TOTAL) INTO THE MUSCLE EVERY 30 (THIRTY) DAYS. 1 mL 5  . lisdexamfetamine (VYVANSE) 70 MG capsule Take 1 capsule (70 mg total) by mouth daily. 30 capsule 0  . lisinopril-hydrochlorothiazide (PRINZIDE,ZESTORETIC) 10-12.5 MG per tablet Take 1 tablet by mouth daily. 90 tablet 1  . metFORMIN (GLUCOPHAGE-XR) 500 MG 24 hr tablet TAKE 1 TABLET BY MOUTH IN THE MORNING AND TAKE 2 TABLETS BY MOUTH IN THE EVENING 90 tablet 5  . ONE TOUCH ULTRA TEST test strip TEST 3 TIMES A DAY 100 each 11  . SYRINGE-NEEDLE, DISP, 3 ML 25G X 1" 3 ML MISC 10 each by Does not apply route as directed. 10 each 2  . venlafaxine XR (EFFEXOR XR) 37.5 MG 24 hr capsule 2 po qd 60 capsule 5  . atorvastatin (LIPITOR) 10 MG tablet TAKE 1 TABLET BY MOUTH EVERY DAY  (Patient not taking: Reported on 04/06/2015) 30 tablet 2  . budesonide-formoterol (SYMBICORT) 160-4.5 MCG/ACT inhaler Inhale 2 puffs into the lungs 2 (two) times daily. (Patient not taking: Reported on 04/06/2015) 1 Inhaler 12   No current facility-administered medications on file prior to visit.       Objective:    Physical Exam  Constitutional: She is oriented to person, place, and time. She appears well-developed and well-nourished. No distress.  HENT:  Right Ear: External ear normal.  Left Ear: External ear normal.  Nose: Nose normal.  Mouth/Throat: Oropharynx is clear and moist.  Eyes: EOM are normal. Pupils are equal, round, and reactive to light.  Neck: Normal range of motion. Neck supple.  Cardiovascular: Normal rate, regular rhythm and normal heart sounds.   No murmur heard. Pulmonary/Chest: Effort normal and breath sounds normal. No respiratory distress. She has no wheezes. She has no rales. She exhibits no tenderness.  Musculoskeletal: She exhibits edema and tenderness.       Feet:  Neurological: She is alert and oriented to person, place, and time.  Psychiatric: She has a normal mood and affect. Her behavior is normal. Judgment and thought content normal.    BP 154/96 mmHg  Pulse 103  Temp(Src) 97.7 F (36.5 C) (Oral)  Resp 18  Ht  (1.676 m)  Wt 224 lb (101.606 kg)  BMI 36.17 kg/m2  SpO2 98% Wt Readings from Last 3 Encounters:  04/06/15 224 lb (101.606 kg)  04/02/15 225 lb 3.2 oz (102.15 kg)  09/05/14 196 lb 13.9 oz (89.3 kg)     Lab Results  Component Value Date   WBC 10.8* 04/02/2015   HGB 15.6* 04/02/2015   HCT 46.3* 04/02/2015   PLT 286.0 04/02/2015   GLUCOSE 103* 04/02/2015   GLUCOSE 114* 04/02/2015   CHOL 182 06/23/2014   TRIG 163* 06/23/2014   HDL 53 06/23/2014   LDLDIRECT 121.4 07/04/2013   LDLCALC 96 06/23/2014   ALT 17 04/02/2015   ALT 16 04/02/2015   AST 15 04/02/2015   AST 15 04/02/2015   NA 136 04/02/2015   NA 142 04/02/2015   K  3.4* 04/02/2015   K 3.8 04/02/2015   CL 101 04/02/2015   CL 103 04/02/2015   CREATININE 0.76 04/02/2015   CREATININE 0.78 04/02/2015   BUN 9 04/02/2015   BUN 9 04/02/2015   CO2 25 04/02/2015   CO2 19 04/02/2015   TSH 2.65 04/02/2015   TSH 2.498 04/02/2015   INR 0.97 06/23/2014   HGBA1C 5.7* 06/23/2014   MICROALBUR 0.89 11/04/2013       Assessment & Plan:   Problem List Items Addressed This Visit    None    Visit Diagnoses    Chronic pain syndrome    -  Primary    Relevant Medications    pregabalin (LYRICA) capsule    Other Relevant Orders    Ambulatory referral to Pain Clinic     1. Chronic pain syndrome  - Ambulatory referral to Pain Clinic - pregabalin (LYRICA) 50 MG capsule; 1 po in am --- 12 noon and 2 po qhs  Dispense: 120 capsule; Refill: 2   I have discontinued Ms. Strojny's ciprofloxacin. I have also changed her pregabalin. Additionally, I am having her maintain her budesonide-formoterol, metFORMIN, atorvastatin, ONE TOUCH ULTRA TEST, SYRINGE-NEEDLE (DISP) 3 ML, lisinopril-hydrochlorothiazide, venlafaxine XR, cyanocobalamin, lisdexamfetamine, clonazePAM, and predniSONE.  Meds ordered this encounter  Medications  . predniSONE (DELTASONE) 10 MG tablet    Sig:   . pregabalin (LYRICA) 50 MG capsule    Sig: 1 po in am --- 12 noon and 2 po qhs    Dispense:  120 capsule    Refill:  2     Loreen Freud, DO

## 2015-04-07 LAB — ESTRADIOL, FREE
ESTRADIOL FREE: 0.92 pg/mL
ESTRADIOL: 43 pg/mL

## 2015-04-09 ENCOUNTER — Telehealth: Payer: Self-pay | Admitting: Family Medicine

## 2015-04-09 MED ORDER — PREDNISONE 10 MG PO TABS
10.0000 mg | ORAL_TABLET | Freq: Every day | ORAL | Status: DC
Start: 2015-04-09 — End: 2015-08-21

## 2015-04-09 NOTE — Telephone Encounter (Addendum)
Her apt is not scheduled yet, she has left a message on VM to schedule the apt to get into the study.     KP

## 2015-04-09 NOTE — Telephone Encounter (Signed)
Caller name:[name] Relation to ZO:XWRUpt:self Call back [phone] Pharmacy:[facility]  Reason for call: pt states. Dr. Laury Axonlowne informed her to let her know if the predinisone helped her with her feet, pt states it did work and she needs another rx and states with a higher dosage if possible states that the only relief at this point.

## 2015-04-09 NOTE — Telephone Encounter (Signed)
Has she gotten an appointment with pain management yet? We can put her on 10 mg daily for short period but not long term pred 10 mg #30  1 po qd

## 2015-04-09 NOTE — Telephone Encounter (Signed)
noted 

## 2015-04-09 NOTE — Telephone Encounter (Signed)
Please advise      KP 

## 2015-05-11 ENCOUNTER — Other Ambulatory Visit: Payer: Self-pay | Admitting: Family Medicine

## 2015-05-11 DIAGNOSIS — G894 Chronic pain syndrome: Secondary | ICD-10-CM

## 2015-05-11 NOTE — Telephone Encounter (Signed)
If pt is completely out of medication, can have 10 for the weekend- otherwise can wait for PCP to return on Monday.  

## 2015-05-11 NOTE — Telephone Encounter (Signed)
See below

## 2015-05-11 NOTE — Telephone Encounter (Signed)
Last seen 04/06/15 and filled 04/05/15 #90 UDS 10/09/14 low risk  Please advise    KP

## 2015-05-12 ENCOUNTER — Other Ambulatory Visit: Payer: Self-pay | Admitting: Family Medicine

## 2015-05-14 ENCOUNTER — Other Ambulatory Visit: Payer: Self-pay | Admitting: Family Medicine

## 2015-05-14 MED ORDER — CLONAZEPAM 1 MG PO TABS: ORAL_TABLET | ORAL | Status: DC

## 2015-05-14 MED ORDER — PREGABALIN 50 MG PO CAPS: ORAL_CAPSULE | ORAL | Status: DC

## 2015-05-14 NOTE — Telephone Encounter (Signed)
Rx faxed.    KP 

## 2015-05-14 NOTE — Addendum Note (Signed)
Addended by: Arnette NorrisPAYNE, Maranatha Grossi P on: 05/14/2015 10:58 AM   Modules accepted: Orders

## 2015-05-14 NOTE — Telephone Encounter (Signed)
Refill x1 each 

## 2015-05-18 ENCOUNTER — Ambulatory Visit (HOSPITAL_COMMUNITY): Payer: Self-pay | Admitting: Psychiatry

## 2015-06-20 ENCOUNTER — Other Ambulatory Visit: Payer: Self-pay | Admitting: Family Medicine

## 2015-06-21 ENCOUNTER — Other Ambulatory Visit: Payer: Self-pay | Admitting: Family Medicine

## 2015-06-21 ENCOUNTER — Telehealth: Payer: Self-pay | Admitting: Family Medicine

## 2015-06-21 DIAGNOSIS — F988 Other specified behavioral and emotional disorders with onset usually occurring in childhood and adolescence: Secondary | ICD-10-CM

## 2015-06-21 MED ORDER — LISDEXAMFETAMINE DIMESYLATE 70 MG PO CAPS: 70.0000 mg | ORAL_CAPSULE | Freq: Every day | ORAL | Status: DC

## 2015-06-21 NOTE — Telephone Encounter (Signed)
Last seen 04/06/15 and filled 05/14/15 #90 UDS 10/09/14 low risk   Please advise     KP

## 2015-06-21 NOTE — Telephone Encounter (Signed)
Caller name: Everleigh Relation to pt: Call back number: [phone] Pharmacy:  Reason for call:   Requesting vyvanse refill

## 2015-06-21 NOTE — Telephone Encounter (Signed)
VM left advising Rx ready for pick up.     KP 

## 2015-06-25 ENCOUNTER — Other Ambulatory Visit: Payer: Self-pay

## 2015-06-25 ENCOUNTER — Telehealth: Payer: Self-pay | Admitting: *Deleted

## 2015-06-25 NOTE — Telephone Encounter (Signed)
Prior authorization/quantity limit exception for Lyrica 50 mg initiated. Awaiting determination through [employer]

## 2015-06-27 NOTE — Telephone Encounter (Signed)
PA approved for coverage until 06/24/2016. File ID: [id number]. JG//CMA

## 2015-07-19 ENCOUNTER — Telehealth: Payer: Self-pay | Admitting: Family Medicine

## 2015-07-19 DIAGNOSIS — F988 Other specified behavioral and emotional disorders with onset usually occurring in childhood and adolescence: Secondary | ICD-10-CM

## 2015-07-19 NOTE — Telephone Encounter (Signed)
Caller name: Neema Relation to pt: Self Call back number: [phone] Pharmacy: [facility]  Reason for call: Pt is requesting rx for cyanocobalamin (,VITAMIN B-12,) 1000 MCG/ML , Clonazepam and lisdexamfetamine (VYVANSE) 70 MG capsule. Please advise.

## 2015-07-20 ENCOUNTER — Telehealth: Payer: Self-pay | Admitting: Family Medicine

## 2015-07-20 MED ORDER — LISDEXAMFETAMINE DIMESYLATE 70 MG PO CAPS: 70.0000 mg | ORAL_CAPSULE | Freq: Every day | ORAL | Status: DC

## 2015-07-20 MED ORDER — CLONAZEPAM 1 MG PO TABS: 1.0000 mg | ORAL_TABLET | Freq: Three times a day (TID) | ORAL | Status: DC | PRN

## 2015-07-20 MED ORDER — CYANOCOBALAMIN 1000 MCG/ML IJ SOLN: INTRAMUSCULAR | Status: DC

## 2015-07-20 NOTE — Telephone Encounter (Signed)
Faxed to [facility].     KP

## 2015-07-20 NOTE — Telephone Encounter (Signed)
Caller name:Fraida Relationship to patient:self Can be [phone] Pharmacy:  Reason for call:klonazapam was not put on the phone call yesterday she is out.  Please call the patient she needs the meds today

## 2015-07-20 NOTE — Telephone Encounter (Signed)
i am waiting for a signature, as long as Dr.Lowne signs it before 5 pm, I will send it, if not it will be sent on Monday.    KP

## 2015-07-20 NOTE — Telephone Encounter (Addendum)
Patient states that the pharmacy told her that they did not receive this rx.  Wants to know if this can be faxed to [facility] since it is so late in day and [facility] closes at 6pm

## 2015-07-20 NOTE — Telephone Encounter (Signed)
VM left advising Rx ready for pick up.     KP 

## 2015-08-21 ENCOUNTER — Ambulatory Visit (INDEPENDENT_AMBULATORY_CARE_PROVIDER_SITE_OTHER): Payer: 59 | Admitting: Family Medicine

## 2015-08-21 ENCOUNTER — Encounter: Payer: Self-pay | Admitting: Family Medicine

## 2015-08-21 VITALS — BP 118/70 | HR 67 | Temp 97.9°F | Wt 224.8 lb

## 2015-08-21 DIAGNOSIS — I10 Essential (primary) hypertension: Secondary | ICD-10-CM

## 2015-08-21 DIAGNOSIS — E538 Deficiency of other specified B group vitamins: Secondary | ICD-10-CM | POA: Diagnosis not present

## 2015-08-21 DIAGNOSIS — E785 Hyperlipidemia, unspecified: Secondary | ICD-10-CM | POA: Diagnosis not present

## 2015-08-21 DIAGNOSIS — R8290 Unspecified abnormal findings in urine: Secondary | ICD-10-CM

## 2015-08-21 DIAGNOSIS — E119 Type 2 diabetes mellitus without complications: Secondary | ICD-10-CM

## 2015-08-21 DIAGNOSIS — F988 Other specified behavioral and emotional disorders with onset usually occurring in childhood and adolescence: Secondary | ICD-10-CM

## 2015-08-21 DIAGNOSIS — M2550 Pain in unspecified joint: Secondary | ICD-10-CM

## 2015-08-21 DIAGNOSIS — R739 Hyperglycemia, unspecified: Secondary | ICD-10-CM | POA: Diagnosis not present

## 2015-08-21 DIAGNOSIS — R509 Fever, unspecified: Secondary | ICD-10-CM

## 2015-08-21 DIAGNOSIS — R5382 Chronic fatigue, unspecified: Secondary | ICD-10-CM

## 2015-08-21 DIAGNOSIS — F909 Attention-deficit hyperactivity disorder, unspecified type: Secondary | ICD-10-CM

## 2015-08-21 LAB — POCT URINALYSIS DIPSTICK
Bilirubin, UA: NEGATIVE
Glucose, UA: NEGATIVE
Ketones, UA: NEGATIVE
NITRITE UA: POSITIVE
PH UA: 6
Protein, UA: NEGATIVE
RBC UA: NEGATIVE
Spec Grav, UA: >=1.03
UROBILINOGEN UA: 0.2

## 2015-08-21 LAB — COMPREHENSIVE METABOLIC PANEL
ALT: 13 U/L (ref 0–35)
AST: 16 U/L (ref 0–37)
Albumin: 3.7 g/dL (ref 3.5–5.2)
Alkaline Phosphatase: 71 U/L (ref 39–117)
BUN: 7 mg/dL (ref 6–23)
CHLORIDE: 104 meq/L (ref 96–112)
CO2: 29 meq/L (ref 19–32)
CREATININE: 0.86 mg/dL (ref 0.40–1.20)
Calcium: 9.2 mg/dL (ref 8.4–10.5)
GFR: 74.14 mL/min (ref >60.00)
Glucose, Bld: 93 mg/dL (ref 70–99)
POTASSIUM: 3.9 meq/L (ref 3.5–5.1)
SODIUM: 141 meq/L (ref 135–145)
Total Bilirubin: 0.5 mg/dL (ref 0.2–1.2)
Total Protein: 6.8 g/dL (ref 6.0–8.3)

## 2015-08-21 LAB — LIPID PANEL
CHOL/HDL RATIO: 5
Cholesterol: 176 mg/dL (ref 0–200)
HDL: 38.8 mg/dL — ABNORMAL LOW (ref >39.00)
LDL CALC: 108 mg/dL — AB (ref 0–99)
NonHDL: 137.18
TRIGLYCERIDES: 147 mg/dL (ref 0.0–149.0)
VLDL: 29.4 mg/dL (ref 0.0–40.0)

## 2015-08-21 LAB — HEMOGLOBIN A1C: Hgb A1c MFr Bld: 5.7 % (ref 4.6–6.5)

## 2015-08-21 LAB — URIC ACID: URIC ACID, SERUM: 4.8 mg/dL (ref 2.4–7.0)

## 2015-08-21 LAB — VITAMIN B12: VITAMIN B 12: 662 pg/mL (ref 211–911)

## 2015-08-21 MED ORDER — CYANOCOBALAMIN 1000 MCG/ML IJ SOLN: INTRAMUSCULAR | Status: DC

## 2015-08-21 MED ORDER — CLONAZEPAM 1 MG PO TABS: 1.0000 mg | ORAL_TABLET | Freq: Three times a day (TID) | ORAL | Status: DC | PRN

## 2015-08-21 MED ORDER — MECLIZINE HCL 25 MG PO TABS: 25.0000 mg | ORAL_TABLET | Freq: Three times a day (TID) | ORAL | Status: DC | PRN

## 2015-08-21 MED ORDER — LISDEXAMFETAMINE DIMESYLATE 70 MG PO CAPS: 70.0000 mg | ORAL_CAPSULE | Freq: Every day | ORAL | Status: DC

## 2015-08-21 NOTE — Patient Instructions (Signed)
Epley Maneuver Self-Care WHAT IS THE EPLEY MANEUVER? The Epley maneuver is an exercise you can do to relieve symptoms of benign paroxysmal positional vertigo (BPPV). This condition is often just referred to as vertigo. BPPV is caused by the movement of tiny crystals (canaliths) inside your inner ear. The accumulation and movement of canaliths in your inner ear causes a sudden spinning sensation (vertigo) when you move your head to certain positions. Vertigo usually lasts about 30 seconds. BPPV usually occurs in just one ear. If you get vertigo when you lie on your left side, you probably have BPPV in your left ear. Your health care provider can tell you which ear is involved.  BPPV may be caused by a head injury. Many people older than 50 get BPPV for unknown reasons. If you have been diagnosed with BPPV, your health care provider may teach you how to do this maneuver. BPPV is not life threatening (benign) and usually goes away in time.  WHEN SHOULD I PERFORM THE EPLEY MANEUVER? You can do this maneuver at home whenever you have symptoms of vertigo. You may do the Epley maneuver up to 3 times a day until your symptoms of vertigo go away. HOW SHOULD I DO THE EPLEY MANEUVER? 1. Sit on the edge of a bed or table with your back straight. Your legs should be extended or hanging over the edge of the bed or table.  2. Turn your head halfway toward the affected ear.  3. Lie backward quickly with your head turned until you are lying flat on your back. You may want to position a pillow under your shoulders.  4. Hold this position for 30 seconds. You may experience an attack of vertigo. This is normal. Hold this position until the vertigo stops. 5. Then turn your head to the opposite direction until your unaffected ear is facing the floor.  6. Hold this position for 30 seconds. You may experience an attack of vertigo. This is normal. Hold this position until the vertigo stops. 7. Now turn your whole body to  the same side as your head. Hold for another 30 seconds.  8. You can then sit back up. ARE THERE RISKS TO THIS MANEUVER? In some cases, you may have other symptoms (such as changes in your vision, weakness, or numbness). If you have these symptoms, stop doing the maneuver and call your health care provider. Even if doing these maneuvers relieves your vertigo, you may still have dizziness. Dizziness is the sensation of light-headedness but without the sensation of movement. Even though the Epley maneuver may relieve your vertigo, it is possible that your symptoms will return within 5 years. WHAT SHOULD I DO AFTER THIS MANEUVER? After doing the Epley maneuver, you can return to your normal activities. Ask your doctor if there is anything you should do at home to prevent vertigo. This may include:  Sleeping with two or more pillows to keep your head elevated.  Not sleeping on the side of your affected ear.  Getting up slowly from bed.  Avoiding sudden movements during the day.  Avoiding extreme head movement, like looking up or bending over.  Wearing a cervical collar to prevent sudden head movements. WHAT SHOULD I DO IF MY SYMPTOMS GET WORSE? Call your health care provider if your vertigo gets worse. Call your provider right way if you have other symptoms, including:   Nausea.  Vomiting.  Headache.  Weakness.  Numbness.  Vision changes. Document Released: 12/20/2013 Document Reviewed: 12/20/2013 Exit[employer]   Patient Information 2015 [employer]. This information is not intended to replace advice given to you by your health care provider. Make sure you discuss any questions you have with your health care provider.  

## 2015-08-21 NOTE — Progress Notes (Signed)
Pre visit review using our clinic review tool, if applicable. No additional management support is needed unless otherwise documented below in the visit note. 

## 2015-08-21 NOTE — Progress Notes (Signed)
Patient ID: [name], female   DOB: 1966-11-20, 49 y.o.   MRN: [id number]   Subjective:    Patient ID: [name], female    DOB: 1966-09-27, 49 y.o.   MRN: [id number]  Chief Complaint  Patient presents with  . Dizziness    and nausea x's 6 days (she needs DM labs)    HPI Patient is in today for c/o dizzinessx 6 days , nausea and still c/o pain in hands and feet.   She sees pain management for injections etc.   She also needs labs and f/u vyvanse, htn, cholesterol.      Past Medical History  Diagnosis Date  . Panic disorder   . Extremity numbness     legs, feet, arms, hands  . Foot fracture     bilateral  . Frequent falls   . Vision problems   . Hair loss   . Memory loss   . Hypotension   . Abdominal pain   . Leg pain     when driving long distances  . Bruises easily   . Kidney stones   . Arthritis   . Anemia   . Irritable bowel   . Depression   . Migraines     Past Surgical History  Procedure Laterality Date  . Cholecystectomy    . Laparoscopic gastric banding    . Foot surgery Left     Plantar Fasc  . Abdominoplasty    . Abdominal hysterectomy    . Tonsillectomy      Family History  Problem Relation Age of Onset  . Hypertension Mother   . Deep vein thrombosis Mother     Finger  . Panic disorder Mother   . Hypertension Father   . Heart attack Father     Deceased, 69s  . Diabetes Maternal Grandmother   . Heart disease Maternal Grandmother     chf MI  . Heart disease Maternal Grandfather 39    MI    Social History   Social History  . Marital Status: Married    Spouse Name: N/A  . Number of Children: N/A  . Years of Education: N/A   Occupational History  . Not on file.   Social History Main Topics  . Smoking status: Current Every Day Smoker -- 1.00 packs/day for 30 years  . Smokeless tobacco: Never Used  . Alcohol Use: No  . Drug Use: No  . Sexual Activity: Yes   Other Topics Concern  . Not on file   Social History Narrative   She lives with husband.  They have one grown child.   She works at Production assistant, [employer].   Highest level of education:  Associates degree in early childhood    Outpatient Prescriptions Prior to Visit  Medication Sig Dispense Refill  . lisinopril-hydrochlorothiazide (PRINZIDE,ZESTORETIC) 10-12.5 MG per tablet Take 1 tablet by mouth daily. 90 tablet 1  . metFORMIN (GLUCOPHAGE-XR) 500 MG 24 hr tablet TAKE 1 TABLET BY MOUTH IN THE MORNING AND TAKE 2 TABLETS BY MOUTH IN THE EVENING 90 tablet 5  . ONE TOUCH ULTRA TEST test strip TEST 3 TIMES A DAY 100 each 11  . SYRINGE-NEEDLE, DISP, 3 ML 25G X 1" 3 ML MISC 10 each by Does not apply route as directed. 10 each 2  . venlafaxine XR (EFFEXOR XR) 37.5 MG 24 hr capsule 2 po qd 60 capsule 5  . clonazePAM (KLONOPIN) 1 MG tablet Take 1 tablet (1 mg total) by  mouth 3 (three) times daily as needed. 90 tablet 0  . cyanocobalamin (,VITAMIN B-12,) 1000 MCG/ML injection INJECT 1 ML (1,000 MCG TOTAL) INTO THE MUSCLE EVERY 30 (THIRTY) DAYS. 1 mL 5  . lisdexamfetamine (VYVANSE) 70 MG capsule Take 1 capsule (70 mg total) by mouth daily. 30 capsule 0  . atorvastatin (LIPITOR) 10 MG tablet TAKE 1 TABLET BY MOUTH EVERY DAY (Patient not taking: Reported on 04/06/2015) 30 tablet 2  . budesonide-formoterol (SYMBICORT) 160-4.5 MCG/ACT inhaler Inhale 2 puffs into the lungs 2 (two) times daily. (Patient not taking: Reported on 04/06/2015) 1 Inhaler 12  . predniSONE (DELTASONE) 10 MG tablet Take 1 tablet (10 mg total) by mouth daily with breakfast. 30 tablet 0  . pregabalin (LYRICA) 50 MG capsule 1 po in am --- 12 noon and 2 po qhs 120 capsule 2   No facility-administered medications prior to visit.    No Known Allergies  Review of Systems  Constitutional: Negative for fever and malaise/fatigue.  HENT: Negative for congestion.   Eyes: Negative for discharge.  Respiratory: Negative for shortness of breath.   Cardiovascular: Negative for chest pain, palpitations and  leg swelling.  Gastrointestinal: Positive for nausea and vomiting. Negative for abdominal pain.  Genitourinary: Negative for dysuria.  Musculoskeletal: Negative for falls.  Skin: Negative for rash.  Neurological: Positive for dizziness. Negative for loss of consciousness and headaches.  Endo/Heme/Allergies: Negative for environmental allergies.  Psychiatric/Behavioral: Negative for depression. The patient is not nervous/anxious.        Objective:    Physical Exam  Constitutional: She is oriented to person, place, and time. She appears well-developed and well-nourished.  HENT:  Head: Normocephalic and atraumatic.  Eyes: Conjunctivae and EOM are normal.  Neck: Normal range of motion. Neck supple. No JVD present. Carotid bruit is not present. No thyromegaly present.  Cardiovascular: Normal rate, regular rhythm and normal heart sounds.   No murmur heard. Pulmonary/Chest: Effort normal and breath sounds normal. No respiratory distress. She has no wheezes. She has no rales. She exhibits no tenderness.  Musculoskeletal: She exhibits no edema.  Neurological: She is alert and oriented to person, place, and time.  Psychiatric: She has a normal mood and affect. Her behavior is normal.    BP 118/70 mmHg  Pulse 67  Temp(Src) 97.9 F (36.6 C) (Oral)  Wt 224 lb 12.8 oz (101.969 kg)  SpO2 97% Wt Readings from Last 3 Encounters:  08/21/15 224 lb 12.8 oz (101.969 kg)  04/06/15 224 lb (101.606 kg)  04/02/15 225 lb 3.2 oz (102.15 kg)     Lab Results  Component Value Date   WBC 10.8* 04/02/2015   HGB 15.6* 04/02/2015   HCT 46.3* 04/02/2015   PLT 286.0 04/02/2015   GLUCOSE 93 08/21/2015   CHOL 176 08/21/2015   TRIG 147.0 08/21/2015   HDL 38.80* 08/21/2015   LDLDIRECT 121.4 07/04/2013   LDLCALC 108* 08/21/2015   ALT 13 08/21/2015   AST 16 08/21/2015   NA 141 08/21/2015   K 3.9 08/21/2015   CL 104 08/21/2015   CREATININE 0.86 08/21/2015   BUN 7 08/21/2015   CO2 29 08/21/2015   TSH  2.65 04/02/2015   TSH 2.498 04/02/2015   INR 0.97 06/23/2014   HGBA1C 5.7 08/21/2015   MICROALBUR 0.89 11/04/2013    Lab Results  Component Value Date   TSH 2.65 04/02/2015   TSH 2.498 04/02/2015   Lab Results  Component Value Date   WBC 10.8* 04/02/2015   HGB 15.6* 04/02/2015  HCT 46.3* 04/02/2015   MCV 90.9 04/02/2015   PLT 286.0 04/02/2015   Lab Results  Component Value Date   NA 141 08/21/2015   K 3.9 08/21/2015   CO2 29 08/21/2015   GLUCOSE 93 08/21/2015   BUN 7 08/21/2015   CREATININE 0.86 08/21/2015   BILITOT 0.5 08/21/2015   ALKPHOS 71 08/21/2015   AST 16 08/21/2015   ALT 13 08/21/2015   PROT 6.8 08/21/2015   ALBUMIN 3.7 08/21/2015   CALCIUM 9.2 08/21/2015   GFR 74.14 08/21/2015   Lab Results  Component Value Date   CHOL 176 08/21/2015   Lab Results  Component Value Date   HDL 38.80* 08/21/2015   Lab Results  Component Value Date   LDLCALC 108* 08/21/2015   Lab Results  Component Value Date   TRIG 147.0 08/21/2015   Lab Results  Component Value Date   CHOLHDL 5 08/21/2015   Lab Results  Component Value Date   HGBA1C 5.7 08/21/2015       Assessment & Plan:   Problem List Items Addressed This Visit    Multiple joint pain   Relevant Orders   Uric acid (Completed)   Hyperlipidemia   Relevant Orders   Hemoglobin A1c (Completed)   Lipid panel (Completed)   Hyperglycemia   Relevant Orders   Hemoglobin A1c (Completed)   POCT urinalysis dipstick (Completed)   HTN (hypertension) - Primary   Relevant Orders   POCT urinalysis dipstick (Completed)   Comprehensive metabolic panel (Completed)   Lipid panel (Completed)    Other Visit Diagnoses    ADD (attention deficit disorder)        Relevant Medications    lisdexamfetamine (VYVANSE) 70 MG capsule    Vitamin B 12 deficiency        Relevant Orders    Vitamin B12 (Completed)    Fever and chills        Relevant Orders    B. burgdorfi antibodies by WB    Rocky mtn spotted fvr abs  pnl(IgG+IgM)    Chronic fatigue        Relevant Orders    B. burgdorfi antibodies by WB    Rocky mtn spotted fvr abs pnl(IgG+IgM)    Urine abnormality        Relevant Orders    Urine Culture    DM II (diabetes mellitus, type II), controlled           I have discontinued Ms. Goers's budesonide-formoterol, atorvastatin, predniSONE, and pregabalin. I am also having her start on meclizine. Additionally, I am having her maintain her metFORMIN, ONE TOUCH ULTRA TEST, SYRINGE-NEEDLE (DISP) 3 ML, lisinopril-hydrochlorothiazide, venlafaxine XR, cyanocobalamin, clonazePAM, and lisdexamfetamine.  Meds ordered this encounter  Medications  . cyanocobalamin (,VITAMIN B-12,) 1000 MCG/ML injection    Sig: INJECT 1 ML (1,000 MCG TOTAL) INTO THE MUSCLE EVERY 30 (THIRTY) DAYS.    Dispense:  1 mL    Refill:  5  . clonazePAM (KLONOPIN) 1 MG tablet    Sig: Take 1 tablet (1 mg total) by mouth 3 (three) times daily as needed.    Dispense:  90 tablet    Refill:  0    Not to exceed 5 additional fills before 11/10/2015.  . lisdexamfetamine (VYVANSE) 70 MG capsule    Sig: Take 1 capsule (70 mg total) by mouth daily.    Dispense:  30 capsule    Refill:  0  . meclizine (ANTIVERT) 25 MG tablet    Sig: Take 1 tablet (  25 mg total) by mouth 3 (three) times daily as needed for dizziness.    Dispense:  30 tablet    Refill:  0     Loreen Freud, DO

## 2015-08-22 ENCOUNTER — Encounter: Payer: Self-pay | Admitting: *Deleted

## 2015-08-22 ENCOUNTER — Telehealth: Payer: Self-pay | Admitting: *Deleted

## 2015-08-22 DIAGNOSIS — E785 Hyperlipidemia, unspecified: Secondary | ICD-10-CM

## 2015-08-22 NOTE — Telephone Encounter (Signed)
-----   Message from Lelon Perla, DO sent at 08/22/2015  9:24 AM EDT ----- Cholesterol--- LDL goal < 100,  HDL >40,  TG < 150.  Diet and exercise will increase HDL and decrease LDL and TG.  Fish,  Fish Oil, Flaxseed oil will also help increase the HDL and decrease Triglycerides.   Recheck labs in 3 months----- creeping up.

## 2015-08-22 NOTE — Telephone Encounter (Signed)
Letter mailed to pt and future lab orders entered.

## 2015-08-23 LAB — URINE CULTURE: Colony Count: >=100000

## 2015-08-23 LAB — B. BURGDORFI ANTIBODIES BY WB
B BURGDORFERI IGG ABS (IB): NEGATIVE
B BURGDORFERI IGM ABS (IB): NEGATIVE

## 2015-08-23 LAB — ROCKY MTN SPOTTED FVR ABS PNL(IGG+IGM)
RMSF IGG: 0.13 IV
RMSF IgM: 0.49 IV

## 2015-08-24 ENCOUNTER — Telehealth: Payer: Self-pay | Admitting: Family Medicine

## 2015-08-24 ENCOUNTER — Ambulatory Visit: Payer: Self-pay | Admitting: Family Medicine

## 2015-08-24 NOTE — Telephone Encounter (Signed)
°  Relation to ZO:XWRU Call back [phone] Pharmacy:  Reason for call: pt states she never received her results from her labs on Tuesday. States it is not available on mychart either.

## 2015-08-24 NOTE — Telephone Encounter (Signed)
Cholesterol--- LDL goal < 100, HDL >40, TG < 150. Diet and exercise will increase HDL and decrease LDL and TG. Fish, Fish Oil, Flaxseed oil will also help increase the HDL and decrease Triglycerides.  Recheck labs in 3 months----- creeping up.   Patient has been made aware and verbalized understanding and copy mailed.   KP

## 2015-08-31 ENCOUNTER — Other Ambulatory Visit: Payer: Self-pay

## 2015-08-31 MED ORDER — CIPROFLOXACIN HCL 250 MG PO TABS: 250.0000 mg | ORAL_TABLET | Freq: Two times a day (BID) | ORAL | Status: DC

## 2015-09-09 ENCOUNTER — Other Ambulatory Visit: Payer: Self-pay | Admitting: Family Medicine

## 2015-09-11 NOTE — Telephone Encounter (Signed)
Last Rx 08/21/15, #90. Please advise.  Name from pharmacy:  In chart as:  CLONAZEPAM 1 MG TABLET clonazePAM (KLONOPIN) 1 MG tablet     Sig: TAKE 1TABLET BY MOUTH 3 TIMES DAILY AS NEEDED.    Dispense: 90 tablet   Refills: 0   Start: 09/09/2015   Class: Normal    Notes to pharmacy: Not to exceed 5 additional fills before 01/16/2016.    Requested on: 07/20/2015    Originally ordered on: 02/06/2014 07/20/2015

## 2015-09-17 ENCOUNTER — Telehealth: Payer: Self-pay | Admitting: Family Medicine

## 2015-09-17 DIAGNOSIS — F988 Other specified behavioral and emotional disorders with onset usually occurring in childhood and adolescence: Secondary | ICD-10-CM

## 2015-09-17 MED ORDER — LISDEXAMFETAMINE DIMESYLATE 70 MG PO CAPS: 70.0000 mg | ORAL_CAPSULE | Freq: Every day | ORAL | Status: DC

## 2015-09-17 NOTE — Telephone Encounter (Signed)
Caller name:Shimika Relationship to patient:SELF  Can be reached:[phone] Pharmacy: [facility]   Reason for call:VYVANSE  NEEDS WRITTEN RX  B 12 SYRUM

## 2015-09-17 NOTE — Telephone Encounter (Signed)
Vm left advising Rx ready for pick up...UDS due now.     KP

## 2015-10-15 ENCOUNTER — Other Ambulatory Visit: Payer: Self-pay | Admitting: Family Medicine

## 2015-10-15 ENCOUNTER — Telehealth: Payer: Self-pay | Admitting: Family Medicine

## 2015-10-15 DIAGNOSIS — F988 Other specified behavioral and emotional disorders with onset usually occurring in childhood and adolescence: Secondary | ICD-10-CM

## 2015-10-15 MED ORDER — LISDEXAMFETAMINE DIMESYLATE 70 MG PO CAPS: 70.0000 mg | ORAL_CAPSULE | Freq: Every day | ORAL | Status: DC

## 2015-10-15 NOTE — Telephone Encounter (Signed)
VM left advising the Rx is ready for pick up tomorrow 10/16/15.      KP

## 2015-10-15 NOTE — Telephone Encounter (Signed)
Pt needing refill on Klonopin. She is out. Use [facility]. Pt needing refill on Vyvanse. She has 6 left. Please call pt when RX ready for pick up @ [phone].

## 2015-10-15 NOTE — Telephone Encounter (Signed)
Last seen 08/21/15 and filled 09/11/15 #90  Please advise     KP

## 2015-10-19 NOTE — Telephone Encounter (Signed)
It was sent downstairs as requested, I made the patient aware she will need to call downstairs and have it transferred if she now wants [facility] to [facility] to [facility]. She verbalized understanding.    KP

## 2015-10-19 NOTE — Telephone Encounter (Signed)
Pt is also requesting a refill [facility]. She says that she's not showing that it has been filled.   Pt would like to [facility] to [id number] [place], [place] - [address] .    Pt's # is [phone]

## 2015-11-15 ENCOUNTER — Telehealth: Payer: Self-pay | Admitting: Family Medicine

## 2015-11-15 DIAGNOSIS — F988 Other specified behavioral and emotional disorders with onset usually occurring in childhood and adolescence: Secondary | ICD-10-CM

## 2015-11-15 MED ORDER — LISDEXAMFETAMINE DIMESYLATE 70 MG PO CAPS: 70.0000 mg | ORAL_CAPSULE | Freq: Every day | ORAL | Status: DC

## 2015-11-15 MED ORDER — FUROSEMIDE 20 MG PO TABS: 20.0000 mg | ORAL_TABLET | Freq: Every day | ORAL | Status: DC

## 2015-11-15 MED ORDER — CLONAZEPAM 1 MG PO TABS: 1.0000 mg | ORAL_TABLET | Freq: Three times a day (TID) | ORAL | Status: DC | PRN

## 2015-11-15 MED ORDER — CYANOCOBALAMIN 1000 MCG/ML IJ SOLN: INTRAMUSCULAR | Status: DC

## 2015-11-15 NOTE — Telephone Encounter (Signed)
Start with lasix 20 mg but if its from the lyrica it most likely will not help #30  1 po qam

## 2015-11-15 NOTE — Telephone Encounter (Signed)
Ok to refill all 3

## 2015-11-15 NOTE — Telephone Encounter (Signed)
Rx faxed, Unable to leave a message. VM not setup.     KP

## 2015-11-15 NOTE — Telephone Encounter (Signed)
Last seen 08/21/15 and filled 10/15/15  Klonopin #90 Vyvanse #30  UDS 09/20/15 low risk

## 2015-11-15 NOTE — Telephone Encounter (Signed)
Relation to XB:JYNWpt:self Call back number:[phone]   Reason for call:  Patient is going out of town and would like to pick up the following Rx below  lisdexamfetamine (VYVANSE) 70 MG capsule  clonazePAM (KLONOPIN) 1 MG tablet  cyanocobalamin (,VITAMIN B-12,) 1000 MCG/ML injection

## 2015-11-15 NOTE — Telephone Encounter (Signed)
Patient aware Rx are ready for pick up.      KP

## 2015-11-15 NOTE — Addendum Note (Signed)
Addended by: Arnette NorrisPAYNE, Duante Arocho P on: 11/15/2015 05:50 PM   Modules accepted: Orders

## 2015-11-15 NOTE — Telephone Encounter (Signed)
Patient stated the Lyrica was causing swelling in her ankles and she is requesting to go back on 40 mg Lasix.  It has not been filled since 2014. Please advise      KP

## 2015-12-18 ENCOUNTER — Other Ambulatory Visit: Payer: Self-pay | Admitting: Family Medicine

## 2015-12-25 ENCOUNTER — Other Ambulatory Visit: Payer: Self-pay | Admitting: Family Medicine

## 2015-12-25 DIAGNOSIS — Z1231 Encounter for screening mammogram for malignant neoplasm of breast: Secondary | ICD-10-CM

## 2015-12-27 ENCOUNTER — Ambulatory Visit (HOSPITAL_BASED_OUTPATIENT_CLINIC_OR_DEPARTMENT_OTHER)
Admission: RE | Admit: 2015-12-27 | Discharge: 2015-12-27 | Disposition: A | Discharge status: Home / Self Care | Payer: 59 | Source: Ambulatory Visit | Attending: Family Medicine | Admitting: Family Medicine

## 2015-12-27 ENCOUNTER — Telehealth: Payer: Self-pay | Admitting: Behavioral Health

## 2015-12-27 DIAGNOSIS — Z1231 Encounter for screening mammogram for malignant neoplasm of breast: Secondary | ICD-10-CM

## 2015-12-27 NOTE — Telephone Encounter (Signed)
Attempted to reach patient for Pre-Visit Call. Per recording, the patient's voice mailbox has not been setup; unable to leave a message.

## 2015-12-28 ENCOUNTER — Encounter: Payer: Self-pay | Admitting: Family Medicine

## 2015-12-28 ENCOUNTER — Ambulatory Visit (INDEPENDENT_AMBULATORY_CARE_PROVIDER_SITE_OTHER): Payer: 59 | Admitting: Family Medicine

## 2015-12-28 ENCOUNTER — Other Ambulatory Visit (HOSPITAL_COMMUNITY)
Admission: RE | Admit: 2015-12-28 | Discharge: 2015-12-28 | Disposition: A | Discharge status: Home / Self Care | Payer: 59 | Source: Ambulatory Visit | Attending: Family Medicine | Admitting: Family Medicine

## 2015-12-28 VITALS — BP 118/62 | HR 79 | Temp 98.5°F | Ht 66.0 in | Wt 226.2 lb

## 2015-12-28 DIAGNOSIS — Z01419 Encounter for gynecological examination (general) (routine) without abnormal findings: Secondary | ICD-10-CM | POA: Insufficient documentation

## 2015-12-28 DIAGNOSIS — G894 Chronic pain syndrome: Secondary | ICD-10-CM

## 2015-12-28 DIAGNOSIS — Z124 Encounter for screening for malignant neoplasm of cervix: Secondary | ICD-10-CM

## 2015-12-28 DIAGNOSIS — Z114 Encounter for screening for human immunodeficiency virus [HIV]: Secondary | ICD-10-CM | POA: Diagnosis not present

## 2015-12-28 DIAGNOSIS — Z23 Encounter for immunization: Secondary | ICD-10-CM | POA: Diagnosis not present

## 2015-12-28 DIAGNOSIS — E538 Deficiency of other specified B group vitamins: Secondary | ICD-10-CM | POA: Insufficient documentation

## 2015-12-28 DIAGNOSIS — F329 Major depressive disorder, single episode, unspecified: Secondary | ICD-10-CM | POA: Diagnosis not present

## 2015-12-28 DIAGNOSIS — Z0000 Encounter for general adult medical examination without abnormal findings: Secondary | ICD-10-CM

## 2015-12-28 DIAGNOSIS — F32A Depression, unspecified: Secondary | ICD-10-CM | POA: Insufficient documentation

## 2015-12-28 DIAGNOSIS — E559 Vitamin D deficiency, unspecified: Secondary | ICD-10-CM | POA: Insufficient documentation

## 2015-12-28 DIAGNOSIS — Z1151 Encounter for screening for human papillomavirus (HPV): Secondary | ICD-10-CM | POA: Insufficient documentation

## 2015-12-28 DIAGNOSIS — N200 Calculus of kidney: Secondary | ICD-10-CM | POA: Insufficient documentation

## 2015-12-28 DIAGNOSIS — J9801 Acute bronchospasm: Secondary | ICD-10-CM

## 2015-12-28 LAB — CBC WITH DIFFERENTIAL/PLATELET
BASOS ABS: 0 10*3/uL (ref 0.0–0.1)
BASOS PCT: 0.3 % (ref 0.0–3.0)
EOS ABS: 0.2 10*3/uL (ref 0.0–0.7)
EOS PCT: 3 % (ref 0.0–5.0)
HEMATOCRIT: 50.8 % — AB (ref 36.0–46.0)
Hemoglobin: 16.4 g/dL — ABNORMAL HIGH (ref 12.0–15.0)
LYMPHS ABS: 1.8 10*3/uL (ref 0.7–4.0)
LYMPHS PCT: 25.4 % (ref 12.0–46.0)
MCHC: 32.3 g/dL (ref 30.0–36.0)
MCV: 93.9 fl (ref 78.0–100.0)
MONO ABS: 0.4 10*3/uL (ref 0.1–1.0)
MONOS PCT: 5.1 % (ref 3.0–12.0)
NEUTROS ABS: 4.6 10*3/uL (ref 1.4–7.7)
NEUTROS PCT: 66.2 % (ref 43.0–77.0)
PLATELETS: 279 10*3/uL (ref 150.0–400.0)
RBC: 5.41 Mil/uL — ABNORMAL HIGH (ref 3.87–5.11)
RDW: 13.3 % (ref 11.5–15.5)
WBC: 6.9 10*3/uL (ref 4.0–10.5)

## 2015-12-28 LAB — COMPREHENSIVE METABOLIC PANEL
ALK PHOS: 77 U/L (ref 39–117)
ALT: 14 U/L (ref 0–35)
AST: 15 U/L (ref 0–37)
Albumin: 4.2 g/dL (ref 3.5–5.2)
BUN: 10 mg/dL (ref 6–23)
CALCIUM: 9.9 mg/dL (ref 8.4–10.5)
CO2: 27 mEq/L (ref 19–32)
Chloride: 103 mEq/L (ref 96–112)
Creatinine, Ser: 0.87 mg/dL (ref 0.40–1.20)
GFR: 73.06 mL/min (ref >60.00)
GLUCOSE: 95 mg/dL (ref 70–99)
POTASSIUM: 4 meq/L (ref 3.5–5.1)
Sodium: 145 mEq/L (ref 135–145)
TOTAL PROTEIN: 8 g/dL (ref 6.0–8.3)
Total Bilirubin: 1 mg/dL (ref 0.2–1.2)

## 2015-12-28 LAB — POCT URINALYSIS DIPSTICK
Bilirubin, UA: NEGATIVE
Blood, UA: NEGATIVE
Glucose, UA: NEGATIVE
Ketones, UA: NEGATIVE
Leukocytes, UA: NEGATIVE
NITRITE UA: NEGATIVE
PROTEIN UA: NEGATIVE
UROBILINOGEN UA: 0.2
pH, UA: 5.5

## 2015-12-28 LAB — TSH: TSH: 2.16 u[IU]/mL (ref 0.35–4.50)

## 2015-12-28 LAB — LIPID PANEL
CHOL/HDL RATIO: 5
CHOLESTEROL: 187 mg/dL (ref 0–200)
HDL: 40.9 mg/dL (ref >39.00)
LDL Cholesterol: 111 mg/dL — ABNORMAL HIGH (ref 0–99)
NonHDL: 146.29
TRIGLYCERIDES: 177 mg/dL — AB (ref 0.0–149.0)
VLDL: 35.4 mg/dL (ref 0.0–40.0)

## 2015-12-28 LAB — VITAMIN B12: VITAMIN B 12: 909 pg/mL (ref 211–911)

## 2015-12-28 LAB — HEMOGLOBIN A1C: Hgb A1c MFr Bld: 6 % (ref 4.6–6.5)

## 2015-12-28 MED ORDER — ALBUTEROL SULFATE HFA 108 (90 BASE) MCG/ACT IN AERS: 2.0000 | INHALATION_SPRAY | Freq: Four times a day (QID) | RESPIRATORY_TRACT | Status: AC | PRN

## 2015-12-28 MED ORDER — CYANOCOBALAMIN 1000 MCG/ML IJ SOLN: INTRAMUSCULAR | Status: DC

## 2015-12-28 MED ORDER — SERTRALINE HCL 50 MG PO TABS: 50.0000 mg | ORAL_TABLET | Freq: Every day | ORAL | Status: DC

## 2015-12-28 MED ORDER — PREGABALIN 50 MG PO CAPS: ORAL_CAPSULE | ORAL | Status: DC

## 2015-12-28 NOTE — Progress Notes (Signed)
Subjective:     [name] is a 49 y.o. female and is here for a comprehensive physical exam. The patient reports problems - teeth hurt after partial put in-- dentist closed until Wed.  Social History   Social History  . Marital Status: Married    Spouse Name: N/A  . Number of Children: N/A  . Years of Education: N/A   Occupational History  . Not on file.   Social History Main Topics  . Smoking status: Current Every Day Smoker -- 1.00 packs/day for 30 years  . Smokeless tobacco: Never Used  . Alcohol Use: No  . Drug Use: No  . Sexual Activity: Yes   Other Topics Concern  . Not on file   Social History Narrative   She lives with husband.  They have one grown child.   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   Highest level of education:  Associates degree in early childhood   Health Maintenance  Topic Date Due  . OPHTHALMOLOGY EXAM  05/01/1975  . HIV Screening  04/30/1980  . PAP SMEAR  04/30/1986  . FOOT EXAM  02/06/2015  . COLONOSCOPY  05/01/2015  . MAMMOGRAM  01/04/2016 (Originally 05/01/1983)  . HEMOGLOBIN A1C  02/21/2016  . INFLUENZA VACCINE  07/29/2016  . PNEUMOCOCCAL POLYSACCHARIDE VACCINE (2) 12/27/2020  . TETANUS/TDAP  06/06/2021    The following portions of the patient's history were reviewed and updated as appropriate:  She  has a past medical history of Panic disorder; Extremity numbness; Foot fracture; Frequent falls; Vision problems; Hair loss; Memory loss; Hypotension; Abdominal pain; Leg pain; Bruises easily; Kidney stones; Arthritis; Anemia; Irritable bowel; Depression; and Migraines. She  does not have any pertinent problems on file. She  has past surgical history that includes Cholecystectomy; Laparoscopic gastric banding; Foot surgery (Left); Abdominoplasty; Abdominal hysterectomy; and Tonsillectomy. Her family history includes Deep vein thrombosis in her mother; Diabetes in her maternal grandmother; Heart attack in her father; Heart disease  in her maternal grandmother; Heart disease (age of onset: 4780) in her maternal grandfather; Hypertension in her father and mother; Panic disorder in her mother. She  reports that she has been smoking.  She has never used smokeless tobacco. She reports that she does not drink alcohol or use illicit drugs. She has a current medication list which includes the following prescription(s): clonazepam, cyanocobalamin, furosemide, lisdexamfetamine, lisinopril-hydrochlorothiazide, lyrica, meclizine, metformin, one touch ultra test, and syringe-needle (disp) 3 ml. Current Outpatient Prescriptions on File Prior to Visit  Medication Sig Dispense Refill  . clonazePAM (KLONOPIN) 1 MG tablet Take 1 tablet (1 mg total) by mouth 3 (three) times daily as needed. 90 tablet 2  . cyanocobalamin (,VITAMIN B-12,) 1000 MCG/ML injection INJECT 1 ML (1,000 MCG TOTAL) INTO THE MUSCLE EVERY 30 (THIRTY) DAYS. 1 mL 5  . furosemide (LASIX) 20 MG tablet Take 1 tablet (20 mg total) by mouth daily. 30 tablet 3  . lisdexamfetamine (VYVANSE) 70 MG capsule Take 1 capsule (70 mg total) by mouth daily. 30 capsule 0  . lisinopril-hydrochlorothiazide (PRINZIDE,ZESTORETIC) 10-12.5 MG per tablet Take 1 tablet by mouth daily. 90 tablet 1  . LYRICA 50 MG capsule TAKE 1 CAPSULE BY MOUTH DAILY IN THE AM, 1 CAP AT NOON AND TAKE 2 CAPS AT BEDTIME AS DIRECTED 120 capsule 2  . meclizine (ANTIVERT) 25 MG tablet Take 1 tablet (25 mg total) by mouth 3 (three) times daily as needed for dizziness. 30 tablet 0  . metFORMIN (GLUCOPHAGE-XR) 500 MG 24 hr  tablet TAKE 1 TABLET BY MOUTH IN THE MORNING AND TAKE 2 TABLETS BY MOUTH IN THE EVENING 90 tablet 5  . ONE TOUCH ULTRA TEST test strip TEST 3 TIMES A DAY 100 each 11  . SYRINGE-NEEDLE, DISP, 3 ML 25G X 1" 3 ML MISC 10 each by Does not apply route as directed. 10 each 2   No current facility-administered medications on file prior to visit.   She has No Known Allergies..  Review of Systems Review of Systems   Constitutional: Negative for activity change, appetite change and fatigue.  HENT: Negative for hearing loss, congestion, tinnitus and ear discharge.  dentist q82m Eyes: Negative for visual disturbance (see optho q1y -- vision corrected to 20/20 with glasses).  Respiratory: Negative for cough, chest tightness and shortness of breath.   Cardiovascular: Negative for chest pain, palpitations and leg swelling.  Gastrointestinal: Negative for abdominal pain, diarrhea, constipation and abdominal distention.  Genitourinary: Negative for urgency, frequency, decreased urine volume and difficulty urinating.  Musculoskeletal: Negative for back pain, arthralgias and gait problem.  Skin: Negative for color change, pallor and rash.  Neurological: Negative for dizziness, light-headedness, numbness and headaches.  Hematological: Negative for adenopathy. Does not bruise/bleed easily.  Psychiatric/Behavioral: Negative for suicidal ideas, confusion, sleep disturbance, self-injury, dysphoric mood, decreased concentration and agitation.       Objective:    BP 118/62 mmHg  Pulse 79  Temp(Src) 98.5 F (36.9 C) (Oral)  Ht  (1.676 m)  Wt 226 lb 3.2 oz (102.604 kg)  BMI 36.53 kg/m2  SpO2 98% General appearance: alert, cooperative, appears stated age and no distress Head: Normocephalic, without obvious abnormality, atraumatic Eyes: conjunctivae/corneas clear. PERRL, EOM's intact. Fundi benign. Ears: normal TM's and external ear canals both ears Nose: Nares normal. Septum midline. Mucosa normal. No drainage or sinus tenderness. Throat: lips, mucosa, and tongue normal; teeth and gums normal Neck: no adenopathy, no carotid bruit, no JVD, supple, symmetrical, trachea midline and thyroid not enlarged, symmetric, no tenderness/mass/nodules Back: symmetric, no curvature. ROM normal. No CVA tenderness. Lungs: clear to auscultation bilaterally Breasts: normal appearance, no masses or tenderness Heart: regular  rate and rhythm, S1, S2 normal, no murmur, click, rub or gallop Abdomen: soft, non-tender; bowel sounds normal; no masses,  no organomegaly Pelvic: cervix normal in appearance, external genitalia normal, no adnexal masses or tenderness, no cervical motion tenderness, perianal skin: no external genital warts noted, rectovaginal septum normal, uterus normal size, shape, and consistency, vagina normal without discharge and pap done , rectal heme neg brown stool Extremities: extremities normal, atraumatic, no cyanosis or edema Pulses: 2+ and symmetric Skin: Skin color, texture, turgor normal. No rashes or lesions Lymph nodes: Cervical, supraclavicular, and axillary nodes normal. Neurologic: Alert and oriented X 3, normal strength and tone. Normal symmetric reflexes. Normal coordination and gait Psych- no depression, no anxiety      Assessment:    Healthy female exam.      Plan:    ghm utd Check labs See After Visit Summary for Counseling Recommendations    1. Need for immunization against influenza   - Flu Vaccine QUAD 36+ mos IM (Fluarix)  2. Need for pneumococcal vaccination   - Pneumococcal polysaccharide vaccine 23-valent greater than or equal to 2yo subcutaneous/IM  3. Preventative health care   - Ambulatory referral to Gastroenterology  4. Vitamin B 12 deficiency   - cyanocobalamin (,VITAMIN B-12,) 1000 MCG/ML injection; INJECT 1 ML (1,000 MCG TOTAL) INTO THE MUSCLE EVERY 30 (THIRTY) DAYS.  Dispense:  1 mL; Refill: 5  5. Chronic pain syndrome   - pregabalin (LYRICA) 50 MG capsule; TAKE 1 CAPSULE BY MOUTH DAILY IN THE AM, 1 CAP AT NOON AND TAKE 2 CAPS AT BEDTIME AS DIRECTED  Dispense: 120 capsule; Refill: 2  6. Depression Pt is not suicidal Start with 1/2 tab daily for 1 week - sertraline (ZOLOFT) 50 MG tablet; Take 1 tablet (50 mg total) by mouth daily.  Dispense: 30 tablet; Refill: 3

## 2015-12-28 NOTE — Progress Notes (Signed)
Pre visit review using our clinic review tool, if applicable. No additional management support is needed unless otherwise documented below in the visit note. 

## 2015-12-28 NOTE — Addendum Note (Signed)
Addended by: Starla LinkAKLEY, Weltha Cathy J on: 12/28/2015 01:30 PM   Modules accepted: Orders, SmartSet

## 2015-12-28 NOTE — Patient Instructions (Signed)
Preventive Care for Adults, Female A healthy lifestyle and preventive care can promote health and wellness. Preventive health guidelines for women include the following key practices.  A routine yearly physical is a good way to check with your health care provider about your health and preventive screening. It is a chance to share any concerns and updates on your health and to receive a thorough exam.  Visit your dentist for a routine exam and preventive care every 6 months. Brush your teeth twice a day and floss once a day. Good oral hygiene prevents tooth decay and gum disease.  The frequency of eye exams is based on your age, health, family medical history, use of contact lenses, and other factors. Follow your health care provider's recommendations for frequency of eye exams.  Eat a healthy diet. Foods like vegetables, fruits, whole grains, low-fat dairy products, and lean protein foods contain the nutrients you need without too many calories. Decrease your intake of foods high in solid fats, added sugars, and salt. Eat the right amount of calories for you.Get information about a proper diet from your health care provider, if necessary.  Regular physical exercise is one of the most important things you can do for your health. Most adults should get at least 150 minutes of moderate-intensity exercise (any activity that increases your heart rate and causes you to sweat) each week. In addition, most adults need muscle-strengthening exercises on 2 or more days a week.  Maintain a healthy weight. The body mass index (BMI) is a screening tool to identify possible weight problems. It provides an estimate of body fat based on height and weight. Your health care provider can find your BMI and can help you achieve or maintain a healthy weight.For adults 20 years and older:  A BMI below 18.5 is considered underweight.  A BMI of 18.5 to 24.9 is normal.  A BMI of 25 to 29.9 is considered overweight.  A  BMI of 30 and above is considered obese.  Maintain normal blood lipids and cholesterol levels by exercising and minimizing your intake of saturated fat. Eat a balanced diet with plenty of fruit and vegetables. Blood tests for lipids and cholesterol should begin at age 45 and be repeated every 5 years. If your lipid or cholesterol levels are high, you are over 50, or you are at high risk for heart disease, you may need your cholesterol levels checked more frequently.Ongoing high lipid and cholesterol levels should be treated with medicines if diet and exercise are not working.  If you smoke, find out from your health care provider how to quit. If you do not use tobacco, do not start.  Lung cancer screening is recommended for adults aged 45-80 years who are at high risk for developing lung cancer because of a history of smoking. A yearly low-dose CT scan of the lungs is recommended for people who have at least a 30-pack-year history of smoking and are a current smoker or have quit within the past 15 years. A pack year of smoking is smoking an average of 1 pack of cigarettes a day for 1 year (for example: 1 pack a day for 30 years or 2 packs a day for 15 years). Yearly screening should continue until the smoker has stopped smoking for at least 15 years. Yearly screening should be stopped for people who develop a health problem that would prevent them from having lung cancer treatment.  If you are pregnant, do not drink alcohol. If you are  breastfeeding, be very cautious about drinking alcohol. If you are not pregnant and choose to drink alcohol, do not have more than 1 drink per day. One drink is considered to be 12 ounces (355 mL) of beer, 5 ounces (148 mL) of wine, or 1.5 ounces (44 mL) of liquor.  Avoid use of street drugs. Do not share needles with anyone. Ask for help if you need support or instructions about stopping the use of drugs.  High blood pressure causes heart disease and increases the risk  of stroke. Your blood pressure should be checked at least every 1 to 2 years. Ongoing high blood pressure should be treated with medicines if weight loss and exercise do not work.  If you are 86-70 years old, ask your health care provider if you should take aspirin to prevent strokes.  Diabetes screening is done by taking a blood sample to check your blood glucose level after you have not eaten for a certain period of time (fasting). If you are not overweight and you do not have risk factors for diabetes, you should be screened once every 3 years starting at age 56. If you are overweight or obese and you are 58-90 years of age, you should be screened for diabetes every year as part of your cardiovascular risk assessment.  Breast cancer screening is essential preventive care for women. You should practice "breast self-awareness." This means understanding the normal appearance and feel of your breasts and may include breast self-examination. Any changes detected, no matter how small, should be reported to a health care provider. Women in their 68s and 30s should have a clinical breast exam (CBE) by a health care provider as part of a regular health exam every 1 to 3 years. After age 70, women should have a CBE every year. Starting at age 72, women should consider having a mammogram (breast X-ray test) every year. Women who have a family history of breast cancer should talk to their health care provider about genetic screening. Women at a high risk of breast cancer should talk to their health care providers about having an MRI and a mammogram every year.  Breast cancer gene (BRCA)-related cancer risk assessment is recommended for women who have family members with BRCA-related cancers. BRCA-related cancers include breast, ovarian, tubal, and peritoneal cancers. Having family members with these cancers may be associated with an increased risk for harmful changes (mutations) in the breast cancer genes BRCA1 and  BRCA2. Results of the assessment will determine the need for genetic counseling and BRCA1 and BRCA2 testing.  Your health care provider may recommend that you be screened regularly for cancer of the pelvic organs (ovaries, uterus, and vagina). This screening involves a pelvic examination, including checking for microscopic changes to the surface of your cervix (Pap test). You may be encouraged to have this screening done every 3 years, beginning at age 18.  For women ages 9-65, health care providers may recommend pelvic exams and Pap testing every 3 years, or they may recommend the Pap and pelvic exam, combined with testing for human papilloma virus (HPV), every 5 years. Some types of HPV increase your risk of cervical cancer. Testing for HPV may also be done on women of any age with unclear Pap test results.  Other health care providers may not recommend any screening for nonpregnant women who are considered low risk for pelvic cancer and who do not have symptoms. Ask your health care provider if a screening pelvic exam is right for  you.  If you have had past treatment for cervical cancer or a condition that could lead to cancer, you need Pap tests and screening for cancer for at least 20 years after your treatment. If Pap tests have been discontinued, your risk factors (such as having a new sexual partner) need to be reassessed to determine if screening should resume. Some women have medical problems that increase the chance of getting cervical cancer. In these cases, your health care provider may recommend more frequent screening and Pap tests.  Colorectal cancer can be detected and often prevented. Most routine colorectal cancer screening begins at the age of 75 years and continues through age 68 years. However, your health care provider may recommend screening at an earlier age if you have risk factors for colon cancer. On a yearly basis, your health care provider may provide home test kits to check  for hidden blood in the stool. Use of a small camera at the end of a tube, to directly examine the colon (sigmoidoscopy or colonoscopy), can detect the earliest forms of colorectal cancer. Talk to your health care provider about this at age 65, when routine screening begins. Direct exam of the colon should be repeated every 5-10 years through age 47 years, unless early forms of precancerous polyps or small growths are found.  People who are at an increased risk for hepatitis B should be screened for this virus. You are considered at high risk for hepatitis B if:  You were born in a country where hepatitis B occurs often. Talk with your health care provider about which countries are considered high risk.  Your parents were born in a high-risk country and you have not received a shot to protect against hepatitis B (hepatitis B vaccine).  You have HIV or AIDS.  You use needles to inject street drugs.  You live with, or have sex with, someone who has hepatitis B.  You get hemodialysis treatment.  You take certain medicines for conditions like cancer, organ transplantation, and autoimmune conditions.  Hepatitis C blood testing is recommended for all people born from [birth year] through 1965 and any individual with known risks for hepatitis C.  Practice safe sex. Use condoms and avoid high-risk sexual practices to reduce the spread of sexually transmitted infections (STIs). STIs include gonorrhea, chlamydia, syphilis, trichomonas, herpes, HPV, and human immunodeficiency virus (HIV). Herpes, HIV, and HPV are viral illnesses that have no cure. They can result in disability, cancer, and death.  You should be screened for sexually transmitted illnesses (STIs) including gonorrhea and chlamydia if:  You are sexually active and are younger than 24 years.  You are older than 24 years and your health care provider tells you that you are at risk for this type of infection.  Your sexual activity has changed  since you were last screened and you are at an increased risk for chlamydia or gonorrhea. Ask your health care provider if you are at risk.  If you are at risk of being infected with HIV, it is recommended that you take a prescription medicine daily to prevent HIV infection. This is called preexposure prophylaxis (PrEP). You are considered at risk if:  You are sexually active and do not regularly use condoms or know the HIV status of your partner(s).  You take drugs by injection.  You are sexually active with a partner who has HIV.  Talk with your health care provider about whether you are at high risk of being infected with HIV. If  you choose to begin PrEP, you should first be tested for HIV. You should then be tested every 3 months for as long as you are taking PrEP.  Osteoporosis is a disease in which the bones lose minerals and strength with aging. This can result in serious bone fractures or breaks. The risk of osteoporosis can be identified using a bone density scan. Women ages 67 years and over and women at risk for fractures or osteoporosis should discuss screening with their health care providers. Ask your health care provider whether you should take a calcium supplement or vitamin D to reduce the rate of osteoporosis.  Menopause can be associated with physical symptoms and risks. Hormone replacement therapy is available to decrease symptoms and risks. You should talk to your health care provider about whether hormone replacement therapy is right for you.  Use sunscreen. Apply sunscreen liberally and repeatedly throughout the day. You should seek shade when your shadow is shorter than you. Protect yourself by wearing long sleeves, pants, a wide-brimmed hat, and sunglasses year round, whenever you are outdoors.  Once a month, do a whole body skin exam, using a mirror to look at the skin on your back. Tell your health care provider of new moles, moles that have irregular borders, moles that  are larger than a pencil eraser, or moles that have changed in shape or color.  Stay current with required vaccines (immunizations).  Influenza vaccine. All adults should be immunized every year.  Tetanus, diphtheria, and acellular pertussis (Td, Tdap) vaccine. Pregnant women should receive 1 dose of Tdap vaccine during each pregnancy. The dose should be obtained regardless of the length of time since the last dose. Immunization is preferred during the 27th-36th week of gestation. An adult who has not previously received Tdap or who does not know her vaccine status should receive 1 dose of Tdap. This initial dose should be followed by tetanus and diphtheria toxoids (Td) booster doses every 10 years. Adults with an unknown or incomplete history of completing a 3-dose immunization series with Td-containing vaccines should begin or complete a primary immunization series including a Tdap dose. Adults should receive a Td booster every 10 years.  Varicella vaccine. An adult without evidence of immunity to varicella should receive 2 doses or a second dose if she has previously received 1 dose. Pregnant females who do not have evidence of immunity should receive the first dose after pregnancy. This first dose should be obtained before leaving the health care facility. The second dose should be obtained 4-8 weeks after the first dose.  Human papillomavirus (HPV) vaccine. Females aged 13-26 years who have not received the vaccine previously should obtain the 3-dose series. The vaccine is not recommended for use in pregnant females. However, pregnancy testing is not needed before receiving a dose. If a female is found to be pregnant after receiving a dose, no treatment is needed. In that case, the remaining doses should be delayed until after the pregnancy. Immunization is recommended for any person with an immunocompromised condition through the age of 61 years if she did not get any or all doses earlier. During the  3-dose series, the second dose should be obtained 4-8 weeks after the first dose. The third dose should be obtained 24 weeks after the first dose and 16 weeks after the second dose.  Zoster vaccine. One dose is recommended for adults aged 30 years or older unless certain conditions are present.  Measles, mumps, and rubella (MMR) vaccine. Adults born  before 1957 generally are considered immune to measles and mumps. Adults born in 1957 or later should have 1 or more doses of MMR vaccine unless there is a contraindication to the vaccine or there is laboratory evidence of immunity to each of the three diseases. A routine second dose of MMR vaccine should be obtained at least 28 days after the first dose for students attending postsecondary schools, health care workers, or international travelers. People who received inactivated measles vaccine or an unknown type of measles vaccine during 1963-1967 should receive 2 doses of MMR vaccine. People who received inactivated mumps vaccine or an unknown type of mumps vaccine before 1979 and are at high risk for mumps infection should consider immunization with 2 doses of MMR vaccine. For females of childbearing age, rubella immunity should be determined. If there is no evidence of immunity, females who are not pregnant should be vaccinated. If there is no evidence of immunity, females who are pregnant should delay immunization until after pregnancy. Unvaccinated health care workers born before 1957 who lack laboratory evidence of measles, mumps, or rubella immunity or laboratory confirmation of disease should consider measles and mumps immunization with 2 doses of MMR vaccine or rubella immunization with 1 dose of MMR vaccine.  Pneumococcal 13-valent conjugate (PCV13) vaccine. When indicated, a person who is uncertain of his immunization history and has no record of immunization should receive the PCV13 vaccine. All adults 65 years of age and older should receive this  vaccine. An adult aged 19 years or older who has certain medical conditions and has not been previously immunized should receive 1 dose of PCV13 vaccine. This PCV13 should be followed with a dose of pneumococcal polysaccharide (PPSV23) vaccine. Adults who are at high risk for pneumococcal disease should obtain the PPSV23 vaccine at least 8 weeks after the dose of PCV13 vaccine. Adults older than 50 years of age who have normal immune system function should obtain the PPSV23 vaccine dose at least 1 year after the dose of PCV13 vaccine.  Pneumococcal polysaccharide (PPSV23) vaccine. When PCV13 is also indicated, PCV13 should be obtained first. All adults aged 65 years and older should be immunized. An adult younger than age 65 years who has certain medical conditions should be immunized. Any person who resides in a nursing home or long-term care facility should be immunized. An adult smoker should be immunized. People with an immunocompromised condition and certain other conditions should receive both PCV13 and PPSV23 vaccines. People with human immunodeficiency virus (HIV) infection should be immunized as soon as possible after diagnosis. Immunization during chemotherapy or radiation therapy should be avoided. Routine use of PPSV23 vaccine is not recommended for American Indians, [place] Natives, or people younger than 65 years unless there are medical conditions that require PPSV23 vaccine. When indicated, people who have unknown immunization and have no record of immunization should receive PPSV23 vaccine. One-time revaccination 5 years after the first dose of PPSV23 is recommended for people aged 19-64 years who have chronic kidney failure, nephrotic syndrome, asplenia, or immunocompromised conditions. People who received 1-2 doses of PPSV23 before age 65 years should receive another dose of PPSV23 vaccine at age 65 years or later if at least 5 years have passed since the previous dose. Doses of PPSV23 are not  needed for people immunized with PPSV23 at or after age 65 years.  Meningococcal vaccine. Adults with asplenia or persistent complement component deficiencies should receive 2 doses of quadrivalent meningococcal conjugate (MenACWY-D) vaccine. The doses should be obtained   at least 2 months apart. Microbiologists working with certain meningococcal bacteria, [employer] recruits, people at risk during an outbreak, and people who travel to or live in countries with a high rate of meningitis should be immunized. A first-year college student up through age 74 years who is living in a residence hall should receive a dose if she did not receive a dose on or after her 16th birthday. Adults who have certain high-risk conditions should receive one or more doses of vaccine.  Hepatitis A vaccine. Adults who wish to be protected from this disease, have certain high-risk conditions, work with hepatitis A-infected animals, work in hepatitis A research labs, or travel to or work in countries with a high rate of hepatitis A should be immunized. Adults who were previously unvaccinated and who anticipate close contact with an international adoptee during the first 60 days after arrival in the [place] from a country with a high rate of hepatitis A should be immunized.  Hepatitis B vaccine. Adults who wish to be protected from this disease, have certain high-risk conditions, may be exposed to blood or other infectious body fluids, are household contacts or sex partners of hepatitis B positive people, are clients or workers in certain care facilities, or travel to or work in countries with a high rate of hepatitis B should be immunized.  Haemophilus influenzae type b (Hib) vaccine. A previously unvaccinated person with asplenia or sickle cell disease or having a scheduled splenectomy should receive 1 dose of Hib vaccine. Regardless of previous immunization, a recipient of a hematopoietic stem cell transplant should receive a  3-dose series 6-12 months after her successful transplant. Hib vaccine is not recommended for adults with HIV infection. Preventive Services / Frequency Ages 29 to 37 years  Blood pressure check.** / Every 3-5 years.  Lipid and cholesterol check.** / Every 5 years beginning at age 96.  Clinical breast exam.** / Every 3 years for women in their 69s and 9s.  BRCA-related cancer risk assessment.** / For women who have family members with a BRCA-related cancer (breast, ovarian, tubal, or peritoneal cancers).  Pap test.** / Every 2 years from ages 23 through 13. Every 3 years starting at age 24 through age 57 or 26 with a history of 3 consecutive normal Pap tests.  HPV screening.** / Every 3 years from ages 82 through ages 48 to 34 with a history of 3 consecutive normal Pap tests.  Hepatitis C blood test.** / For any individual with known risks for hepatitis C.  Skin self-exam. / Monthly.  Influenza vaccine. / Every year.  Tetanus, diphtheria, and acellular pertussis (Tdap, Td) vaccine.** / Consult your health care provider. Pregnant women should receive 1 dose of Tdap vaccine during each pregnancy. 1 dose of Td every 10 years.  Varicella vaccine.** / Consult your health care provider. Pregnant females who do not have evidence of immunity should receive the first dose after pregnancy.  HPV vaccine. / 3 doses over 6 months, if 4 and younger. The vaccine is not recommended for use in pregnant females. However, pregnancy testing is not needed before receiving a dose.  Measles, mumps, rubella (MMR) vaccine.** / You need at least 1 dose of MMR if you were born in 1957 or later. You may also need a 2nd dose. For females of childbearing age, rubella immunity should be determined. If there is no evidence of immunity, females who are not pregnant should be vaccinated. If there is no evidence of immunity, females who are  pregnant should delay immunization until after pregnancy.  Pneumococcal  13-valent conjugate (PCV13) vaccine.** / Consult your health care provider.  Pneumococcal polysaccharide (PPSV23) vaccine.** / 1 to 2 doses if you smoke cigarettes or if you have certain conditions.  Meningococcal vaccine.** / 1 dose if you are age 68 to 8 years and a Market researcher living in a residence hall, or have one of several medical conditions, you need to get vaccinated against meningococcal disease. You may also need additional booster doses.  Hepatitis A vaccine.** / Consult your health care provider.  Hepatitis B vaccine.** / Consult your health care provider.  Haemophilus influenzae type b (Hib) vaccine.** / Consult your health care provider. Ages 7 to 53 years  Blood pressure check.** / Every year.  Lipid and cholesterol check.** / Every 5 years beginning at age 25 years.  Lung cancer screening. / Every year if you are aged 11-80 years and have a 30-pack-year history of smoking and currently smoke or have quit within the past 15 years. Yearly screening is stopped once you have quit smoking for at least 15 years or develop a health problem that would prevent you from having lung cancer treatment.  Clinical breast exam.** / Every year after age 48 years.  BRCA-related cancer risk assessment.** / For women who have family members with a BRCA-related cancer (breast, ovarian, tubal, or peritoneal cancers).  Mammogram.** / Every year beginning at age 41 years and continuing for as long as you are in good health. Consult with your health care provider.  Pap test.** / Every 3 years starting at age 65 years through age 37 or 70 years with a history of 3 consecutive normal Pap tests.  HPV screening.** / Every 3 years from ages 72 years through ages 60 to 40 years with a history of 3 consecutive normal Pap tests.  Fecal occult blood test (FOBT) of stool. / Every year beginning at age 21 years and continuing until age 5 years. You may not need to do this test if you get  a colonoscopy every 10 years.  Flexible sigmoidoscopy or colonoscopy.** / Every 5 years for a flexible sigmoidoscopy or every 10 years for a colonoscopy beginning at age 35 years and continuing until age 48 years.  Hepatitis C blood test.** / For all people born from [birth year] through 1966 and any individual with known risks for hepatitis C.  Skin self-exam. / Monthly.  Influenza vaccine. / Every year.  Tetanus, diphtheria, and acellular pertussis (Tdap/Td) vaccine.** / Consult your health care provider. Pregnant women should receive 1 dose of Tdap vaccine during each pregnancy. 1 dose of Td every 10 years.  Varicella vaccine.** / Consult your health care provider. Pregnant females who do not have evidence of immunity should receive the first dose after pregnancy.  Zoster vaccine.** / 1 dose for adults aged 30 years or older.  Measles, mumps, rubella (MMR) vaccine.** / You need at least 1 dose of MMR if you were born in 1957 or later. You may also need a second dose. For females of childbearing age, rubella immunity should be determined. If there is no evidence of immunity, females who are not pregnant should be vaccinated. If there is no evidence of immunity, females who are pregnant should delay immunization until after pregnancy.  Pneumococcal 13-valent conjugate (PCV13) vaccine.** / Consult your health care provider.  Pneumococcal polysaccharide (PPSV23) vaccine.** / 1 to 2 doses if you smoke cigarettes or if you have certain conditions.  Meningococcal vaccine.** /  Consult your health care provider.  Hepatitis A vaccine.** / Consult your health care provider.  Hepatitis B vaccine.** / Consult your health care provider.  Haemophilus influenzae type b (Hib) vaccine.** / Consult your health care provider. Ages 64 years and over  Blood pressure check.** / Every year.  Lipid and cholesterol check.** / Every 5 years beginning at age 23 years.  Lung cancer screening. / Every year if you  are aged 16-80 years and have a 30-pack-year history of smoking and currently smoke or have quit within the past 15 years. Yearly screening is stopped once you have quit smoking for at least 15 years or develop a health problem that would prevent you from having lung cancer treatment.  Clinical breast exam.** / Every year after age 74 years.  BRCA-related cancer risk assessment.** / For women who have family members with a BRCA-related cancer (breast, ovarian, tubal, or peritoneal cancers).  Mammogram.** / Every year beginning at age 44 years and continuing for as long as you are in good health. Consult with your health care provider.  Pap test.** / Every 3 years starting at age 58 years through age 22 or 39 years with 3 consecutive normal Pap tests. Testing can be stopped between 65 and 70 years with 3 consecutive normal Pap tests and no abnormal Pap or HPV tests in the past 10 years.  HPV screening.** / Every 3 years from ages 64 years through ages 70 or 61 years with a history of 3 consecutive normal Pap tests. Testing can be stopped between 65 and 70 years with 3 consecutive normal Pap tests and no abnormal Pap or HPV tests in the past 10 years.  Fecal occult blood test (FOBT) of stool. / Every year beginning at age 40 years and continuing until age 27 years. You may not need to do this test if you get a colonoscopy every 10 years.  Flexible sigmoidoscopy or colonoscopy.** / Every 5 years for a flexible sigmoidoscopy or every 10 years for a colonoscopy beginning at age 7 years and continuing until age 32 years.  Hepatitis C blood test.** / For all people born from [birth year] through 1966 and any individual with known risks for hepatitis C.  Osteoporosis screening.** / A one-time screening for women ages 30 years and over and women at risk for fractures or osteoporosis.  Skin self-exam. / Monthly.  Influenza vaccine. / Every year.  Tetanus, diphtheria, and acellular pertussis (Tdap/Td)  vaccine.** / 1 dose of Td every 10 years.  Varicella vaccine.** / Consult your health care provider.  Zoster vaccine.** / 1 dose for adults aged 35 years or older.  Pneumococcal 13-valent conjugate (PCV13) vaccine.** / Consult your health care provider.  Pneumococcal polysaccharide (PPSV23) vaccine.** / 1 dose for all adults aged 46 years and older.  Meningococcal vaccine.** / Consult your health care provider.  Hepatitis A vaccine.** / Consult your health care provider.  Hepatitis B vaccine.** / Consult your health care provider.  Haemophilus influenzae type b (Hib) vaccine.** / Consult your health care provider. ** Family history and personal history of risk and conditions may change your health care provider's recommendations.   This information is not intended to replace advice given to you by your health care provider. Make sure you discuss any questions you have with your health care provider.   Document Released: 02/10/2002 Document Revised: 01/05/2015 Document Reviewed: 05/12/2011 Elsevier Interactive Patient Education [employer].

## 2015-12-29 LAB — HIV ANTIBODY (ROUTINE TESTING W REFLEX): HIV: NONREACTIVE

## 2016-01-01 LAB — CYTOLOGY - PAP

## 2016-01-21 MED FILL — clonazePAM 1 MG TABS: 1 | 30 days supply | Qty: 90 | Fill #2

## 2016-02-15 ENCOUNTER — Other Ambulatory Visit: Payer: Self-pay | Admitting: Family Medicine

## 2016-02-15 MED ORDER — LISDEXAMFETAMINE DIMESYLATE 70 MG PO CAPS
70.0000 mg | ORAL_CAPSULE | Freq: Every day | ORAL | Status: DC
Start: 2016-02-15 — End: 2016-03-27

## 2016-02-19 ENCOUNTER — Other Ambulatory Visit: Payer: Self-pay | Admitting: Family Medicine

## 2016-02-19 NOTE — Telephone Encounter (Signed)
Rx faxed to [facility] per incoming request.     KP

## 2016-02-19 NOTE — Telephone Encounter (Signed)
Pt requesting refill on klonipon  Last office visit 12-27-16 #90 with 2 rf Last UDS 09/20/15 Klonipon

## 2016-02-29 MED FILL — VYVANSE 70 MG CAPSULE: 70 | 30 days supply | Qty: 30 | Fill #0

## 2016-03-27 ENCOUNTER — Telehealth: Payer: Self-pay | Admitting: Family Medicine

## 2016-03-27 ENCOUNTER — Other Ambulatory Visit: Payer: Self-pay | Admitting: Family Medicine

## 2016-03-27 MED ORDER — LISDEXAMFETAMINE DIMESYLATE 70 MG PO CAPS
70.0000 mg | ORAL_CAPSULE | Freq: Every day | ORAL | Status: DC
Start: 2016-03-27 — End: 2016-04-21

## 2016-03-27 NOTE — Telephone Encounter (Signed)
Pt is requesting a refill on her VYVANSE Rx.    Please call pt when ready for pick up.  CB: [phone]

## 2016-03-27 NOTE — Telephone Encounter (Signed)
Duplicate request. Replied via mychart.

## 2016-03-28 ENCOUNTER — Telehealth: Payer: Self-pay | Admitting: Family Medicine

## 2016-03-28 MED FILL — VYVANSE 70 MG CAPSULE: 70 | 30 days supply | Qty: 30 | Fill #0

## 2016-03-28 NOTE — Telephone Encounter (Signed)
Pt's Husband Jeannett Senior[place] picked up prescription for wife (pt needed to have UDS done) asked provider Ok to give to husband,pt's husband was informed pt needs to pick up next one.

## 2016-04-09 ENCOUNTER — Other Ambulatory Visit: Payer: Self-pay | Admitting: Family Medicine

## 2016-04-09 MED ORDER — MECLIZINE HCL 25 MG PO TABS: 25.0000 mg | ORAL_TABLET | Freq: Three times a day (TID) | ORAL | Status: DC | PRN

## 2016-04-09 NOTE — Telephone Encounter (Signed)
Last seen 12/28/15 and filled 08/21/15 #30   Please advise    KP

## 2016-04-13 ENCOUNTER — Other Ambulatory Visit: Payer: Self-pay | Admitting: Family Medicine

## 2016-04-16 ENCOUNTER — Other Ambulatory Visit: Payer: Self-pay | Admitting: Family Medicine

## 2016-04-21 ENCOUNTER — Encounter: Payer: Self-pay | Admitting: Family Medicine

## 2016-04-21 ENCOUNTER — Ambulatory Visit (INDEPENDENT_AMBULATORY_CARE_PROVIDER_SITE_OTHER): Payer: 59 | Admitting: Family Medicine

## 2016-04-21 VITALS — BP 132/88 | HR 87 | Temp 98.0°F | Ht 66.0 in | Wt 229.6 lb

## 2016-04-21 DIAGNOSIS — E785 Hyperlipidemia, unspecified: Secondary | ICD-10-CM

## 2016-04-21 DIAGNOSIS — E1140 Type 2 diabetes mellitus with diabetic neuropathy, unspecified: Secondary | ICD-10-CM | POA: Diagnosis not present

## 2016-04-21 DIAGNOSIS — R202 Paresthesia of skin: Secondary | ICD-10-CM | POA: Diagnosis not present

## 2016-04-21 DIAGNOSIS — R200 Anesthesia of skin: Secondary | ICD-10-CM

## 2016-04-21 DIAGNOSIS — F329 Major depressive disorder, single episode, unspecified: Secondary | ICD-10-CM

## 2016-04-21 DIAGNOSIS — R300 Dysuria: Secondary | ICD-10-CM

## 2016-04-21 DIAGNOSIS — F909 Attention-deficit hyperactivity disorder, unspecified type: Secondary | ICD-10-CM

## 2016-04-21 DIAGNOSIS — F988 Other specified behavioral and emotional disorders with onset usually occurring in childhood and adolescence: Secondary | ICD-10-CM

## 2016-04-21 DIAGNOSIS — I10 Essential (primary) hypertension: Secondary | ICD-10-CM

## 2016-04-21 DIAGNOSIS — R8299 Other abnormal findings in urine: Secondary | ICD-10-CM

## 2016-04-21 DIAGNOSIS — F32A Depression, unspecified: Secondary | ICD-10-CM

## 2016-04-21 DIAGNOSIS — R82998 Other abnormal findings in urine: Secondary | ICD-10-CM

## 2016-04-21 LAB — POCT URINALYSIS DIPSTICK
Bilirubin, UA: NEGATIVE
GLUCOSE UA: NEGATIVE
Ketones, UA: NEGATIVE
Leukocytes, UA: NEGATIVE
NITRITE UA: NEGATIVE
PH UA: 6
PROTEIN UA: NEGATIVE
RBC UA: NEGATIVE
UROBILINOGEN UA: 0.2

## 2016-04-21 MED ORDER — "SYRINGE/NEEDLE (DISP) 25G X 1"" 3 ML MISC": 10.0000 | Status: DC

## 2016-04-21 MED ORDER — GLUCOSE BLOOD VI STRP: ORAL_STRIP | Status: DC

## 2016-04-21 MED ORDER — LISDEXAMFETAMINE DIMESYLATE 70 MG PO CAPS: 70.0000 mg | ORAL_CAPSULE | Freq: Every day | ORAL | Status: DC

## 2016-04-21 MED ORDER — SERTRALINE HCL 100 MG PO TABS: 100.0000 mg | ORAL_TABLET | Freq: Every day | ORAL | Status: DC

## 2016-04-21 NOTE — Patient Instructions (Signed)

## 2016-04-21 NOTE — Progress Notes (Signed)
Pre visit review using our clinic review tool, if applicable. No additional management support is needed unless otherwise documented below in the visit note. 

## 2016-04-21 NOTE — Progress Notes (Signed)
Patient ID: [name], female    DOB: October 02, 1966  Age: 50 y.o. MRN: [id number]    Subjective:  Subjective HPI [name] presents for dm, htn and hyperlipidemia  HPI HYPERTENSION  Blood pressure range-normal  Chest pain- no      Dyspnea- no Lightheadedness- no   Edema- no Other side effects - no   Medication compliance: good Low salt diet- yes  DIABETES  Blood Sugar ranges-elevated  Polyuria- no New Visual problems- no Hypoglycemic symptoms- no Other side effects-no Medication compliance - good Last eye exam- due Foot exam- today  HYPERLIPIDEMIA  Medication compliance- no RUQ pain- no  Muscle aches- no Other side effects-no   Review of Systems  Constitutional: Negative for diaphoresis, appetite change, fatigue and unexpected weight change.  Eyes: Negative for pain, redness and visual disturbance.  Respiratory: Negative for cough, chest tightness, shortness of breath and wheezing.   Cardiovascular: Negative for chest pain, palpitations and leg swelling.  Endocrine: Negative for cold intolerance, heat intolerance, polydipsia, polyphagia and polyuria.  Genitourinary: Negative for dysuria, frequency and difficulty urinating.  Neurological: Negative for dizziness, light-headedness, numbness and headaches.    History Past Medical History  Diagnosis Date  . Panic disorder   . Extremity numbness     legs, feet, arms, hands  . Foot fracture     bilateral  . Frequent falls   . Vision problems   . Hair loss   . Memory loss   . Hypotension   . Abdominal pain   . Leg pain     when driving long distances  . Bruises easily   . Kidney stones   . Arthritis   . Anemia   . Irritable bowel   . Depression   . Migraines     She has past surgical history that includes Cholecystectomy; Laparoscopic gastric banding; Foot surgery (Left); Abdominoplasty; Abdominal hysterectomy; and Tonsillectomy.   Her family history includes Deep vein thrombosis in her mother;  Diabetes in her maternal grandmother; Heart attack in her father; Heart disease in her maternal grandmother; Heart disease (age of onset: 44) in her maternal grandfather; Hypertension in her father and mother; Panic disorder in her mother.She reports that she has been smoking.  She has never used smokeless tobacco. She reports that she does not drink alcohol or use illicit drugs.  Current Outpatient Prescriptions on File Prior to Visit  Medication Sig Dispense Refill  . albuterol (PROAIR HFA) 108 (90 Base) MCG/ACT inhaler Inhale 2 puffs into the lungs every 6 (six) hours as needed for wheezing or shortness of breath. 1 Inhaler 3  . clonazePAM (KLONOPIN) 1 MG tablet TAKE 1 TABLET BY MOUTH THREE TIMES A DAY AS NEEDED 90 tablet 2  . cyanocobalamin (,VITAMIN B-12,) 1000 MCG/ML injection INJECT 1 ML (1,000 MCG TOTAL) INTO THE MUSCLE EVERY 30 (THIRTY) DAYS. 1 mL 5  . furosemide (LASIX) 20 MG tablet Take 1 tablet (20 mg total) by mouth daily. 30 tablet 3  . meclizine (ANTIVERT) 25 MG tablet Take 1 tablet (25 mg total) by mouth 3 (three) times daily as needed for dizziness. 30 tablet 0  . pregabalin (LYRICA) 50 MG capsule TAKE 1 CAPSULE BY MOUTH DAILY IN THE AM, 1 CAP AT NOON AND TAKE 2 CAPS AT BEDTIME AS DIRECTED 120 capsule 2   No current facility-administered medications on file prior to visit.     Objective:  Objective Physical Exam  Constitutional: She is oriented to person, place, and time. She appears well-developed  and well-nourished.  HENT:  Head: Normocephalic and atraumatic.  Eyes: Conjunctivae and EOM are normal.  Neck: Normal range of motion. Neck supple. No JVD present. Carotid bruit is not present. No thyromegaly present.  Cardiovascular: Normal rate, regular rhythm and normal heart sounds.   No murmur heard. Pulmonary/Chest: Effort normal and breath sounds normal. No respiratory distress. She has no wheezes. She has no rales. She exhibits no tenderness.  Musculoskeletal: She exhibits  no edema.  Neurological: She is alert and oriented to person, place, and time.  Psychiatric: She has a normal mood and affect. Her behavior is normal. Judgment and thought content normal.  Nursing note and vitals reviewed.  Sensory exam of the foot is normal, tested with the monofilament. Good pulses, no lesions or ulcers, good peripheral pulses.  BP 132/88 mmHg  Pulse 87  Temp(Src) 98 F (36.7 C) (Oral)  Ht 5\' 6"  (1.676 m)  Wt 229 lb 9.6 oz (104.146 kg)  BMI 37.08 kg/m2  SpO2 98% Wt Readings from Last 3 Encounters:  04/21/16 229 lb 9.6 oz (104.146 kg)  12/28/15 226 lb 3.2 oz (102.604 kg)  08/21/15 224 lb 12.8 oz (101.969 kg)     Lab Results  Component Value Date   WBC 9.5 04/21/2016   HGB 16.2* 04/21/2016   HCT 47.9* 04/21/2016   PLT 260.0 04/21/2016   GLUCOSE 96 04/21/2016   CHOL 192 04/21/2016   TRIG 201.0* 04/21/2016   HDL 42.30 04/21/2016   LDLDIRECT 123.0 04/21/2016   LDLCALC 111* 12/28/2015   ALT 18 04/21/2016   AST 20 04/21/2016   NA 134* 04/21/2016   K 4.3 04/21/2016   CL 100 04/21/2016   CREATININE 0.86 04/21/2016   BUN 9 04/21/2016   CO2 27 04/21/2016   TSH 2.09 04/21/2016   INR 0.97 06/23/2014   HGBA1C 6.3 04/21/2016   MICROALBUR 0.89 11/04/2013    No results found.   Assessment & Plan:  Plan I have discontinued Ms. Tormey's sertraline. I have changed her ONE TOUCH ULTRA TEST to glucose blood. I have also changed her lisinopril-hydrochlorothiazide. Additionally, I am having her start on sertraline. Lastly, I am having her maintain her furosemide, cyanocobalamin, pregabalin, albuterol, clonazePAM, meclizine, lisdexamfetamine, SYRINGE-NEEDLE (DISP) 3 ML, and metFORMIN.  Meds ordered this encounter  Medications  . sertraline (ZOLOFT) 100 MG tablet    Sig: Take 1 tablet (100 mg total) by mouth daily.    Dispense:  30 tablet    Refill:  3  . lisdexamfetamine (VYVANSE) 70 MG capsule    Sig: Take 1 capsule (70 mg total) by mouth daily.    Dispense:  30  capsule    Refill:  0  . glucose blood (ONE TOUCH ULTRA TEST) test strip    Sig: TEST 3 TIMES A DAY    Dispense:  100 each    Refill:  11  . SYRINGE-NEEDLE, DISP, 3 ML 25G X 1" 3 ML MISC    Sig: 10 each by Does not apply route as directed.    Dispense:  10 each    Refill:  2  . lisinopril-hydrochlorothiazide (PRINZIDE,ZESTORETIC) 10-12.5 MG tablet    Sig: Take 1 tablet by mouth daily.    Dispense:  90 tablet    Refill:  1  . metFORMIN (GLUCOPHAGE-XR) 500 MG 24 hr tablet    Sig: TAKE 1 TABLET BY MOUTH IN THE MORNING AND TAKE 2 TABLETS BY MOUTH IN THE EVENING    Dispense:  90 tablet    Refill:  5    Problem List Items Addressed This Visit      Unprioritized   Hyperlipidemia - Primary   Relevant Medications   lisinopril-hydrochlorothiazide (PRINZIDE,ZESTORETIC) 10-12.5 MG tablet   Other Relevant Orders   Comprehensive metabolic panel (Completed)   Lipid panel (Completed)   Comprehensive metabolic panel   Hemoglobin A1c   Lipid panel   POCT urinalysis dipstick    Other Visit Diagnoses    Dysuria        Relevant Orders    POCT urinalysis dipstick    Numbness and tingling        Relevant Orders    Comprehensive metabolic panel (Completed)    CBC with Differential/Platelet (Completed)    Lipid panel (Completed)    Vitamin B12 (Completed)    POCT urinalysis dipstick (Completed)    Vitamin D (25 hydroxy) (Completed)    Ambulatory referral to Neurology    Vitamin B12    TSH (Completed)    Controlled type 2 diabetes mellitus with diabetic neuropathy, without long-term current use of insulin (HCC)        Relevant Medications    glucose blood (ONE TOUCH ULTRA TEST) test strip    SYRINGE-NEEDLE, DISP, 3 ML 25G X 1" 3 ML MISC    lisinopril-hydrochlorothiazide (PRINZIDE,ZESTORETIC) 10-12.5 MG tablet    metFORMIN (GLUCOPHAGE-XR) 500 MG 24 hr tablet    Other Relevant Orders    Hemoglobin A1c (Completed)    Comprehensive metabolic panel    Hemoglobin A1c    Lipid panel     POCT urinalysis dipstick    Depression        Relevant Medications    sertraline (ZOLOFT) 100 MG tablet    Essential hypertension        Relevant Medications    lisinopril-hydrochlorothiazide (PRINZIDE,ZESTORETIC) 10-12.5 MG tablet    Other Relevant Orders    Comprehensive metabolic panel    Hemoglobin A1c    Lipid panel    POCT urinalysis dipstick    ADD (attention deficit disorder)        Relevant Medications    lisdexamfetamine (VYVANSE) 70 MG capsule    Other abnormal findings in urine        Relevant Orders    Urine culture (Completed)       Follow-up: Return in about 6 months (around 10/21/2016), or if symptoms worsen or fail to improve, for hypertension, hyperlipidemia, diabetes II.  Donato Schultz, DO

## 2016-04-22 LAB — COMPREHENSIVE METABOLIC PANEL
ALBUMIN: 3.8 g/dL (ref 3.5–5.2)
ALT: 18 U/L (ref 0–35)
AST: 20 U/L (ref 0–37)
Alkaline Phosphatase: 73 U/L (ref 39–117)
BUN: 9 mg/dL (ref 6–23)
CHLORIDE: 100 meq/L (ref 96–112)
CO2: 27 mEq/L (ref 19–32)
CREATININE: 0.86 mg/dL (ref 0.40–1.20)
Calcium: 9.1 mg/dL (ref 8.4–10.5)
GFR: 73.94 mL/min (ref >60.00)
GLUCOSE: 96 mg/dL (ref 70–99)
Potassium: 4.3 mEq/L (ref 3.5–5.1)
SODIUM: 134 meq/L — AB (ref 135–145)
Total Bilirubin: 1 mg/dL (ref 0.2–1.2)
Total Protein: 7.3 g/dL (ref 6.0–8.3)

## 2016-04-22 LAB — LIPID PANEL
CHOL/HDL RATIO: 5
CHOLESTEROL: 192 mg/dL (ref 0–200)
HDL: 42.3 mg/dL (ref >39.00)
NonHDL: 149.95
TRIGLYCERIDES: 201 mg/dL — AB (ref 0.0–149.0)
VLDL: 40.2 mg/dL — ABNORMAL HIGH (ref 0.0–40.0)

## 2016-04-22 LAB — CBC WITH DIFFERENTIAL/PLATELET
BASOS PCT: 0.3 % (ref 0.0–3.0)
Basophils Absolute: 0 10*3/uL (ref 0.0–0.1)
EOS ABS: 0.2 10*3/uL (ref 0.0–0.7)
Eosinophils Relative: 1.8 % (ref 0.0–5.0)
HCT: 47.9 % — ABNORMAL HIGH (ref 36.0–46.0)
HEMOGLOBIN: 16.2 g/dL — AB (ref 12.0–15.0)
Lymphocytes Relative: 19.4 % (ref 12.0–46.0)
Lymphs Abs: 1.9 10*3/uL (ref 0.7–4.0)
MCHC: 33.9 g/dL (ref 30.0–36.0)
MCV: 91.7 fl (ref 78.0–100.0)
MONO ABS: 0.2 10*3/uL (ref 0.1–1.0)
Monocytes Relative: 2.4 % — ABNORMAL LOW (ref 3.0–12.0)
NEUTROS PCT: 76.1 % (ref 43.0–77.0)
Neutro Abs: 7.3 10*3/uL (ref 1.4–7.7)
PLATELETS: 260 10*3/uL (ref 150.0–400.0)
RBC: 5.22 Mil/uL — ABNORMAL HIGH (ref 3.87–5.11)
RDW: 14.3 % (ref 11.5–15.5)
WBC: 9.5 10*3/uL (ref 4.0–10.5)

## 2016-04-22 LAB — LDL CHOLESTEROL, DIRECT: LDL DIRECT: 123 mg/dL

## 2016-04-22 LAB — VITAMIN D 25 HYDROXY (VIT D DEFICIENCY, FRACTURES): VITD: 22.14 ng/mL — ABNORMAL LOW (ref 30.00–100.00)

## 2016-04-22 LAB — HEMOGLOBIN A1C: HEMOGLOBIN A1C: 6.3 % (ref 4.6–6.5)

## 2016-04-22 LAB — TSH: TSH: 2.09 u[IU]/mL (ref 0.35–4.50)

## 2016-04-22 LAB — VITAMIN B12: Vitamin B-12: 787 pg/mL (ref 211–911)

## 2016-04-23 LAB — URINE CULTURE

## 2016-04-23 MED ORDER — METFORMIN HCL ER 500 MG PO TB24: ORAL_TABLET | ORAL | Status: DC

## 2016-04-23 MED ORDER — LISINOPRIL-HYDROCHLOROTHIAZIDE 10-12.5 MG PO TABS: 1.0000 | ORAL_TABLET | Freq: Every day | ORAL | Status: DC

## 2016-04-24 ENCOUNTER — Other Ambulatory Visit: Payer: Self-pay

## 2016-04-24 MED ORDER — CIPROFLOXACIN HCL 250 MG PO TABS: 250.0000 mg | ORAL_TABLET | Freq: Two times a day (BID) | ORAL | Status: DC

## 2016-04-24 MED ORDER — VITAMIN D (ERGOCALCIFEROL) 1.25 MG (50000 UNIT) PO CAPS: 50000.0000 [IU] | ORAL_CAPSULE | ORAL | Status: DC

## 2016-05-19 ENCOUNTER — Other Ambulatory Visit: Payer: Self-pay | Admitting: Family Medicine

## 2016-05-19 ENCOUNTER — Encounter: Payer: Self-pay | Admitting: Family Medicine

## 2016-05-19 ENCOUNTER — Telehealth: Payer: Self-pay | Admitting: Family Medicine

## 2016-05-19 DIAGNOSIS — F988 Other specified behavioral and emotional disorders with onset usually occurring in childhood and adolescence: Secondary | ICD-10-CM

## 2016-05-19 NOTE — Telephone Encounter (Signed)
Patient requesting refill on klonipin t.i.d Last office visit 04/21/16 Last fill 02-19-16 with #90 and 2 rf Last UDS 04-21-16 Low risk

## 2016-05-19 NOTE — Telephone Encounter (Signed)
Rx faxed to pharmacy  

## 2016-05-20 MED ORDER — LISDEXAMFETAMINE DIMESYLATE 70 MG PO CAPS: 70.0000 mg | ORAL_CAPSULE | Freq: Every day | ORAL | Status: DC

## 2016-05-20 NOTE — Telephone Encounter (Signed)
Pt is requesting refill on Vyvanse.  Last OV: 04/21/2016  Last Fill: 04/21/2016 #30 and 0RF UDS: 04/21/2016 Low risk  Please advise .

## 2016-05-20 NOTE — Telephone Encounter (Signed)
Rx was faxed to pharmacy on 05/19/16 at 6:44pm.

## 2016-05-20 NOTE — Telephone Encounter (Signed)
Rx's placed at the front desk for pick up at Pt's convenience. Pt informed via MyChart.

## 2016-05-20 NOTE — Telephone Encounter (Signed)
Rx's for May, June, and July 2017 printed, awaiting DO signature.

## 2016-05-20 NOTE — Telephone Encounter (Signed)
Refill x1--- can have 3 sep rx if she would like

## 2016-05-30 MED FILL — VYVANSE 70 MG CAPSULE: 70 | 30 days supply | Qty: 30 | Fill #0

## 2016-06-26 ENCOUNTER — Encounter: Payer: Self-pay | Admitting: Family Medicine

## 2016-07-02 ENCOUNTER — Encounter: Payer: Self-pay | Admitting: Family Medicine

## 2016-07-12 ENCOUNTER — Other Ambulatory Visit: Payer: Self-pay | Admitting: Family Medicine

## 2016-07-14 NOTE — Telephone Encounter (Signed)
Rx sent to the pharmary by e-script.//AB/CMA 

## 2016-07-28 ENCOUNTER — Other Ambulatory Visit: Payer: Self-pay | Admitting: Family Medicine

## 2016-07-28 ENCOUNTER — Encounter: Payer: Self-pay | Admitting: Family Medicine

## 2016-07-28 DIAGNOSIS — F988 Other specified behavioral and emotional disorders with onset usually occurring in childhood and adolescence: Secondary | ICD-10-CM

## 2016-08-07 ENCOUNTER — Other Ambulatory Visit: Payer: Self-pay | Admitting: Family Medicine

## 2016-08-07 NOTE — Telephone Encounter (Signed)
Last seen 04/21/16 and Vitamin D Was low. Please advise    KP

## 2016-08-13 ENCOUNTER — Other Ambulatory Visit: Payer: Self-pay | Admitting: Family Medicine

## 2016-08-13 ENCOUNTER — Encounter: Payer: Self-pay | Admitting: Family Medicine

## 2016-08-13 DIAGNOSIS — F988 Other specified behavioral and emotional disorders with onset usually occurring in childhood and adolescence: Secondary | ICD-10-CM

## 2016-08-13 NOTE — Telephone Encounter (Signed)
Pt called in. Pt says that pharmacy states that she doesn't have a refill. Pt is requesting a refill on clonazePAM    [id number] [place], [place] - [address]

## 2016-08-13 NOTE — Telephone Encounter (Signed)
Last ov 04/21/16, last fill 05/19/16 #90 2 refill, last UDS 04/21/16. Please advise.

## 2016-08-14 ENCOUNTER — Other Ambulatory Visit: Payer: Self-pay | Admitting: Family Medicine

## 2016-08-14 ENCOUNTER — Encounter: Payer: Self-pay | Admitting: Family Medicine

## 2016-08-14 DIAGNOSIS — F411 Generalized anxiety disorder: Secondary | ICD-10-CM

## 2016-08-14 MED ORDER — LISDEXAMFETAMINE DIMESYLATE 70 MG PO CAPS: 70.0000 mg | ORAL_CAPSULE | Freq: Every day | ORAL | 0 refills | Status: DC

## 2016-08-14 MED ORDER — CLONAZEPAM 1 MG PO TABS: 1.0000 mg | ORAL_TABLET | Freq: Three times a day (TID) | ORAL | 2 refills | Status: DC | PRN

## 2016-08-14 NOTE — Telephone Encounter (Signed)
Last seen 04/21/16 Last filled #90 w/2 refills Please advise

## 2016-08-14 NOTE — Telephone Encounter (Signed)
Printed

## 2016-08-26 ENCOUNTER — Other Ambulatory Visit: Payer: Self-pay | Admitting: Family Medicine

## 2016-09-30 ENCOUNTER — Encounter (HOSPITAL_COMMUNITY): Payer: Self-pay

## 2016-11-17 ENCOUNTER — Other Ambulatory Visit: Payer: Self-pay | Admitting: Family Medicine

## 2016-11-17 DIAGNOSIS — F411 Generalized anxiety disorder: Secondary | ICD-10-CM

## 2016-11-17 NOTE — Telephone Encounter (Signed)
[facility]am 1mg  Last filled - 08/14/16, #90x2 Last AEX - 04/21/16 Next AEX - 6-Mths.; patient has no future ROV scheduled Please Advise on refills/SLS 11/20

## 2016-11-17 NOTE — Telephone Encounter (Signed)
Rx request faxed to pharmacy/SLS  

## 2016-11-28 ENCOUNTER — Other Ambulatory Visit: Payer: Self-pay | Admitting: Family Medicine

## 2016-11-28 DIAGNOSIS — F909 Attention-deficit hyperactivity disorder, unspecified type: Secondary | ICD-10-CM

## 2016-11-28 NOTE — Telephone Encounter (Signed)
Caller name: Relationship to patient: Self Can be reached: [phone] Pharmacy:  Reason for call: Refill on lisdexamfetamine (VYVANSE) 70 MG capsule [[id number]][[id number]]

## 2016-12-01 MED ORDER — LISDEXAMFETAMINE DIMESYLATE 70 MG PO CAPS: 70.0000 mg | ORAL_CAPSULE | Freq: Every day | ORAL | 0 refills | Status: DC

## 2016-12-01 NOTE — Addendum Note (Signed)
Addended by: Vergie LivingBECKETT, Soley Harriss S on: 12/01/2016 05:23 PM   Modules accepted: Orders

## 2016-12-01 NOTE — Telephone Encounter (Signed)
Rx printed, awaiting provider's signature. LB 

## 2016-12-01 NOTE — Telephone Encounter (Signed)
Ok to refill for 3 months.  

## 2016-12-01 NOTE — Telephone Encounter (Signed)
Last ov 04/21/16. Last fill 08/14/16 #30 0. Please advise. LB

## 2016-12-02 NOTE — Telephone Encounter (Signed)
Patient informed Rx ready for p/u during regular business hours, understood & agreed/SLS 12/05

## 2017-01-06 ENCOUNTER — Other Ambulatory Visit: Payer: Self-pay | Admitting: Family Medicine

## 2017-01-06 NOTE — Telephone Encounter (Signed)
Received

## 2017-02-02 ENCOUNTER — Other Ambulatory Visit: Payer: Self-pay | Admitting: Family Medicine

## 2017-02-02 DIAGNOSIS — G894 Chronic pain syndrome: Secondary | ICD-10-CM

## 2017-02-03 NOTE — Telephone Encounter (Signed)
Last refill for Lyrica #120 with 2 refills on 12/28/2015 Last office visit 4//24/2017 No contract--UDS-low risk done on 10/14/2016

## 2017-02-04 ENCOUNTER — Telehealth: Payer: Self-pay

## 2017-02-04 ENCOUNTER — Other Ambulatory Visit: Payer: Self-pay | Admitting: Family Medicine

## 2017-02-04 DIAGNOSIS — G894 Chronic pain syndrome: Secondary | ICD-10-CM

## 2017-02-04 NOTE — Telephone Encounter (Signed)
Sent Rx to pharmacy. LB 

## 2017-02-07 ENCOUNTER — Other Ambulatory Visit: Payer: Self-pay | Admitting: Family Medicine

## 2017-02-09 NOTE — Telephone Encounter (Signed)
Refill faxed into pharmacy on 02/04/2017

## 2017-03-05 ENCOUNTER — Other Ambulatory Visit: Payer: Self-pay | Admitting: Family Medicine

## 2017-03-05 DIAGNOSIS — F411 Generalized anxiety disorder: Secondary | ICD-10-CM

## 2017-03-05 MED ORDER — CLONAZEPAM 1 MG PO TABS: 1.0000 mg | ORAL_TABLET | Freq: Three times a day (TID) | ORAL | 2 refills | Status: DC | PRN

## 2017-03-05 NOTE — Addendum Note (Signed)
Addended by: Clearance CootsPATTERSON, Anastasija Anfinson D on: 03/05/2017 04:04 PM   Modules accepted: Orders

## 2017-03-05 NOTE — Telephone Encounter (Signed)
[facility] hard [facility] to [id number]

## 2017-03-05 NOTE — Telephone Encounter (Signed)
Received rill request for Clonazepam 1mg  (take 1 tab po TID prn) # 90; 2RF  Last Rf: 02/05/2017 Last Ov: 04/21/2016 No follow up appointment has been scheduled at this time. UDS: 10/09/14 no controlled substance contract signed, uds sample given, low risk. 09/20/15 uds sample given, low risk, next screen 03/19/16.  04/21/16 uds sample given, low risk next screen 10/21/16.  Forwarded to Provider for review, approval or denial.

## 2017-03-06 ENCOUNTER — Other Ambulatory Visit: Payer: Self-pay | Admitting: Family Medicine

## 2017-03-06 ENCOUNTER — Encounter: Payer: Self-pay | Admitting: Family Medicine

## 2017-03-06 DIAGNOSIS — F909 Attention-deficit hyperactivity disorder, unspecified type: Secondary | ICD-10-CM

## 2017-03-06 DIAGNOSIS — F411 Generalized anxiety disorder: Secondary | ICD-10-CM

## 2017-03-06 NOTE — Telephone Encounter (Signed)
vyvanse requires a visit every 6 months---- pt is overdue for an ov

## 2017-03-06 NOTE — Telephone Encounter (Signed)
Last refill on aug/sept/oct/ 2017 Office visit 04/21/2016--no upcoming appt. Scheduled Contract signed on 04/21/2016 UDS low risk done on 10/21/2016

## 2017-03-07 ENCOUNTER — Other Ambulatory Visit: Payer: Self-pay | Admitting: Family Medicine

## 2017-03-07 DIAGNOSIS — F411 Generalized anxiety disorder: Secondary | ICD-10-CM

## 2017-03-10 ENCOUNTER — Encounter: Payer: Self-pay | Admitting: Family Medicine

## 2017-03-10 ENCOUNTER — Telehealth: Payer: Self-pay | Admitting: Family Medicine

## 2017-03-10 NOTE — Telephone Encounter (Signed)
Patient reported that she has bilateral ringing & ear pain, dizziness, nausea, swollen lymph node glands and slight fever. Also, stating that she "feels if though she's on a ship"; has only taken Claritin. Patient voiced that she recently went "cold [place]" and stopped taking her Klonopin because she does not have any more medication left and has been unable to come in for an office visit to have it refilled. She declines ED visit at this time. No available appointments in office today. Scheduled patient for tomorrow, 03/11/17 at 11:00 AM with Esperanza RichtersEdward Saguier, PA-C. Advised patient to stay hydrated & to seek the nearest ED or urgent care if symptoms worsen or become more severe. She verbalized understanding and did not have any further questions or concerns before the call ended. Message routed to Saguier, PA-C for review.

## 2017-03-10 NOTE — Telephone Encounter (Signed)
°  Patient Name: [name] DOB: 03/31/1966 Initial Comment Caller is dizzy, has pressure in ears, slight fever Nurse Assessment Nurse: Reed Pandyamsey, RN, Amy Date/Time (Eastern Time): 03/10/2017 1:46:36 PM Confirm and document reason for call. If symptomatic, describe symptoms. ---Caller states her lymph nodes are swollen the size of golf balls, dizziness, pressure in ears, fever of 101.1 this morning. She says the sensation she's having is if she were on a boat and she feels off balance. She says she needs to come in so more Klonopin can be ordered and she has been unable to get an appointment. Does the patient have any new or worsening symptoms? ---Yes Will a triage be completed? ---Yes Related visit to physician within the last 2 weeks? ---No Does the PT have any chronic conditions? (i.e. diabetes, asthma, etc.) ---Yes List chronic conditions. ---Diabetes Is the patient pregnant or possibly pregnant? (Ask all females between the ages of 1612-55) ---No Is this a behavioral health or substance abuse call? ---No Guidelines Guideline Title Affirmed Question Affirmed Notes Dizziness - Vertigo [1] Dizziness (vertigo) present now AND [2] one or more stroke risk factors (i.e., hypertension, diabetes, prior stroke/TIA/ heart attack) (Exception: prior physician evaluation for this AND no different/worse than usual) Final Disposition User Go to ED Now (or PCP triage) Reed Pandyamsey, RN, Amy Referrals GO TO FACILITY REFUSED Disagree/Comply: Comply Pt refused please call back

## 2017-03-10 NOTE — Telephone Encounter (Signed)
Follow up call made regarding Clonazepam. Patient has appointment scheduled with E. Saguier, PA. For tomorrow.

## 2017-03-11 ENCOUNTER — Encounter: Payer: Self-pay | Admitting: Medical

## 2017-03-11 ENCOUNTER — Ambulatory Visit (INDEPENDENT_AMBULATORY_CARE_PROVIDER_SITE_OTHER): Payer: 59 | Admitting: Medical

## 2017-03-11 VITALS — BP 130/93 | HR 98 | Temp 98.3°F | Resp 16 | Ht 66.0 in | Wt 225.1 lb

## 2017-03-11 DIAGNOSIS — R0981 Nasal congestion: Secondary | ICD-10-CM

## 2017-03-11 DIAGNOSIS — J329 Chronic sinusitis, unspecified: Secondary | ICD-10-CM | POA: Diagnosis not present

## 2017-03-11 DIAGNOSIS — Z8619 Personal history of other infectious and parasitic diseases: Secondary | ICD-10-CM

## 2017-03-11 DIAGNOSIS — F411 Generalized anxiety disorder: Secondary | ICD-10-CM

## 2017-03-11 MED ORDER — CLONAZEPAM 1 MG PO TABS: 1.0000 mg | ORAL_TABLET | Freq: Three times a day (TID) | ORAL | 2 refills | Status: DC | PRN

## 2017-03-11 MED ORDER — AMOXICILLIN-POT CLAVULANATE 875-125 MG PO TABS: 1.0000 | ORAL_TABLET | Freq: Two times a day (BID) | ORAL | 0 refills | Status: DC

## 2017-03-11 MED ORDER — FLUTICASONE PROPIONATE 50 MCG/ACT NA SUSP: 2.0000 | Freq: Every day | NASAL | 1 refills | Status: DC

## 2017-03-11 MED FILL — clonazePAM 1 MG TABS: 1 | 30 days supply | Qty: 90 | Fill #0 | Status: TO

## 2017-03-11 NOTE — Patient Instructions (Addendum)
For your recent nasal congestion, ear pressure/probable infection and sinus infection, I am prescribing augmentin antibiotic.  For your nasal congestion rx flonase.  Your balance issues may be ear infection related and maybe from abrupt stopping klonopin after years of benzo use.  If you balance get worse or any other signs and symptoms as discussed then ED evaluation.  I called your pharmacy and they confirmed that they did not get any rx of klonopin. So I am refilling the rx tht Dr. Laury AxonLowne has written.  Follow up with Dr. Laury AxonLowne as scheduled otherwise follow as needed

## 2017-03-11 NOTE — Progress Notes (Signed)
Subjective:    Patient ID: [name], female    DOB: 02/03/1966, 51 y.o.   MRN: [id number]  HPI   Pt in for evaluation.  Pt has been trying to get appointment with her pcp. Pt needed a refill of her clonazepam. She needed refills of her clonazepam since last week. Pt states she states he medication was supposed to be filled a week ago. Pt was under the impression that clonazepam was supposed to be at her pharmacy. Pt had a message on my chart that the prescription was sent. Pt communicated with the pharmacy and she was told rx not at pharmacy. Pt has has been struggled with anxiety and stress.  Pt also has nasal congestion, sinus pressure, faint ringing to ears, ear pressure and faint dizzy. Pt has history of ear infections when younger. No sneezing or itching. Maybe mild allergies in the spring.    Review of Systems  Constitutional: Negative for chills and fatigue.  HENT: Positive for congestion, sinus pressure and sneezing.   Respiratory: Negative for cough, choking, chest tightness and wheezing.   Cardiovascular: Negative for palpitations.  Gastrointestinal: Negative for abdominal pain.  Musculoskeletal: Negative for back pain and gait problem.  Skin:       Slight rt upper lip possible early cold sore. Sometimes will get but recently has mild small bump present for 5 days.  Neurological: Negative for dizziness, facial asymmetry and headaches.  Hematological: Negative for adenopathy. Does not bruise/bleed easily.  Psychiatric/Behavioral: Negative for behavioral problems and confusion.     Past Medical History:  Diagnosis Date  . Abdominal pain   . Anemia   . Arthritis   . Bruises easily   . Depression   . Extremity numbness    legs, feet, arms, hands  . Foot fracture    bilateral  . Frequent falls   . Hair loss   . Hypotension   . Irritable bowel   . Kidney stones   . Leg pain    when driving long distances  . Memory loss   . Migraines   . Panic disorder   .  Vision problems      Social History   Social History  . Marital status: Married    Spouse name: N/A  . Number of children: N/A  . Years of education: N/A   Occupational History  . Not on file.   Social History Main Topics  . Smoking status: Current Every Day Smoker    Packs/day: 1.00    Years: 30.00  . Smokeless tobacco: Never Used  . Alcohol use No  . Drug use: No  . Sexual activity: Yes   Other Topics Concern  . Not on file   Social History Narrative   She lives with husband.  They have one grown child.   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   Highest level of education:  Associates degree in early childhood    Past Surgical History:  Procedure Laterality Date  . ABDOMINAL HYSTERECTOMY    . ABDOMINOPLASTY    . CHOLECYSTECTOMY    . FOOT SURGERY Left    Plantar Fasc  . LAPAROSCOPIC GASTRIC BANDING    . TONSILLECTOMY      Family History  Problem Relation Age of Onset  . Hypertension Mother   . Deep vein thrombosis Mother     Finger  . Panic disorder Mother   . Hypertension Father   . Heart attack Father     Deceased,  50s  . Diabetes Maternal Grandmother   . Heart disease Maternal Grandmother     chf MI  . Heart disease Maternal Grandfather 28    MI    No Known Allergies  Current Outpatient Prescriptions on File Prior to Visit  Medication Sig Dispense Refill  . albuterol (PROAIR HFA) 108 (90 Base) MCG/ACT inhaler Inhale 2 puffs into the lungs every 6 (six) hours as needed for wheezing or shortness of breath. 1 Inhaler 3  . clonazePAM (KLONOPIN) 1 MG tablet Take 1 tablet (1 mg total) by mouth 3 (three) times daily as needed. 90 tablet 2  . cyanocobalamin (,VITAMIN B-12,) 1000 MCG/ML injection INJECT 1 ML (1,000 MCG TOTAL) INTO THE MUSCLE EVERY 30 (THIRTY) DAYS. 1 mL 5  . furosemide (LASIX) 20 MG tablet Take 1 tablet (20 mg total) by mouth daily. 30 tablet 3  . glucose blood (ONE TOUCH ULTRA TEST) test strip TEST 3 TIMES A DAY 100 each 11  .  lisinopril-hydrochlorothiazide (PRINZIDE,ZESTORETIC) 10-12.5 MG tablet Take 1 tablet by mouth daily. 90 tablet 1  . meclizine (ANTIVERT) 25 MG tablet Take 1 tablet (25 mg total) by mouth 3 (three) times daily as needed for dizziness. 30 tablet 0  . metFORMIN (GLUCOPHAGE-XR) 500 MG 24 hr tablet TAKE 1 TABLET BY MOUTH IN THE MORNING AND TAKE 2 TABLETS IN THE EVENING 90 tablet 3  . sertraline (ZOLOFT) 100 MG tablet Take 1 tablet (100 mg total) by mouth daily. 30 tablet 3  . SYRINGE-NEEDLE, DISP, 3 ML 25G X 1" 3 ML MISC 10 each by Does not apply route as directed. 10 each 2  . Vitamin D, Ergocalciferol, (DRISDOL) 50000 units CAPS capsule TAKE 1 CAPSULE (50,000 UNITS TOTAL) BY MOUTH EVERY 7 (SEVEN) DAYS. 4 capsule 2  . lisdexamfetamine (VYVANSE) 70 MG capsule Take 1 capsule (70 mg total) by mouth daily. (Patient not taking: Reported on 03/11/2017) 30 capsule 0  . lisdexamfetamine (VYVANSE) 70 MG capsule Take 1 capsule (70 mg total) by mouth daily. (Patient not taking: Reported on 03/11/2017) 30 capsule 0  . lisdexamfetamine (VYVANSE) 70 MG capsule Take 1 capsule (70 mg total) by mouth daily. (Patient not taking: Reported on 03/11/2017) 30 capsule 0  . LYRICA 50 MG capsule TAKE 1 CAPSULE BY MOUTH DAILY IN AM, 1 CAP AT NOON, AND 2 CAPS AT BEDTIME AS DIRECTED. (Patient not taking: Reported on 03/11/2017) 120 capsule 3   No current facility-administered medications on file prior to visit.     BP (!) 130/93 (BP Location: Right Arm, Patient Position: Sitting, Cuff Size: Large)   Pulse 98   Temp 98.3 F (36.8 C) (Oral)   Resp 16   Ht 5\' 6"  (1.676 m)   Wt 225 lb 2 oz (102.1 kg)   SpO2 100%   BMI 36.34 kg/m       Objective:   Physical Exam  General  Mental Status - Alert. General Appearance - Well groomed. Not in acute distress. Some frustrated and anxious about not getting xnax.  Skin Rashes- No Rashes.  HEENT Head- Normal. Ear Auditory Canal - Left- Normal. Right - Normal.Tympanic Membrane-  Left- Red Right-  Red Eye Sclera/Conjunctiva- Left- Normal. Right- Normal. Nose & Sinuses Nasal Mucosa- Left-  Boggy and Congested. Right-  Boggy and  Congested.Bilateral maxillary and frontal sinus pressure. Mouth & Throat Lips: Upper Lip- Normal: no dryness, cracking, pallor, cyanosis, or vesicular eruption. Lower Lip-Normal: no dryness, cracking, pallor, cyanosis or vesicular eruption. Buccal Mucosa- Bilateral- No Aphthous ulcers.  Oropharynx- No Discharge or Erythema. Tonsils: Characteristics- Bilateral- No Erythema or Congestion. Size/Enlargement- Bilateral- No enlargement. Discharge- bilateral-None.  Neck Neck- Supple. No Masses.(faint shoddy submandibular nodes reported swelling). Not now.(Likely  associated with ear infections)   Chest and Lung Exam Auscultation: Breath Sounds:-Clear even and unlabored.  Cardiovascular Auscultation:Rythm- Regular, rate and rhythm. Murmurs & Other Heart Sounds:Ausculatation of the heart reveal- No Murmurs.  Lymphatic Head & Neck General Head & Neck Lymphatics: Bilateral: Description- No Localized lymphadenopathy.  General Mental Status- Alert. General Appearance- Not in acute distress.   Skin General: Color- Normal Color. Moisture- Normal Moisture.  Neck Carotid Arteries- Normal color. Moisture- Normal Moisture. No carotid bruits. No JVD.  Chest and Lung Exam Auscultation: Breath Sounds:-Normal.  Cardiovascular Auscultation:Rythm- Regular. Murmurs & Other Heart Sounds:Auscultation of the heart reveals- No Murmurs.  Abdomen Inspection:-Inspeection Normal. Palpation/Percussion:Note:No mass. Palpation and Percussion of the abdomen reveal- Non Tender, Non Distended + BS, no rebound or guarding.   Neurologic Cranial Nerve exam:- CN III-XII intact(No nystagmus), symmetric smile. Drift Test:- No drift. Romberg Exam:- Negative.  Finger to Nose:- Normal/Intact(but some mild difficulty) Strength:- 5/5 equal and symmetric strength both  upper and lower extremities.       Assessment & Plan:  For your recent nasal congestion, ear pressure/probable infection and sinus infection, I am prescribing augmentin antibiotic.  For your nasal congestion rx flonase.  Your balance issues may be ear infection related and maybe from abrupt stopping klonopin after years of benzo use.  If you balance get worse or any other signs and symptoms as discussed then ED evaluation.  I called your pharmacy and they confirmed that they did not get any rx of klonopin. So I am refilling the rx tht Dr. Laury Axon has written.  Follow up with Dr. Laury Axon as scheduled otherwise follow as needed  Pt given the print rx klonipin today. So she can give to pharmacy.  Illias Pantano, Ramon Dredge, PA-C

## 2017-03-11 NOTE — Progress Notes (Signed)
Pre visit review using our clinic review tool, if applicable. No additional management support is needed unless otherwise documented below in the visit note/SLS  

## 2017-03-13 ENCOUNTER — Encounter: Payer: Self-pay | Admitting: Family Medicine

## 2017-03-13 ENCOUNTER — Ambulatory Visit (INDEPENDENT_AMBULATORY_CARE_PROVIDER_SITE_OTHER): Payer: 59 | Admitting: Family Medicine

## 2017-03-13 VITALS — BP 138/70 | HR 96 | Temp 98.4°F | Resp 16 | Ht 66.0 in | Wt 226.4 lb

## 2017-03-13 DIAGNOSIS — F418 Other specified anxiety disorders: Secondary | ICD-10-CM

## 2017-03-13 DIAGNOSIS — F329 Major depressive disorder, single episode, unspecified: Secondary | ICD-10-CM

## 2017-03-13 DIAGNOSIS — F909 Attention-deficit hyperactivity disorder, unspecified type: Secondary | ICD-10-CM

## 2017-03-13 DIAGNOSIS — I10 Essential (primary) hypertension: Secondary | ICD-10-CM | POA: Diagnosis not present

## 2017-03-13 DIAGNOSIS — F988 Other specified behavioral and emotional disorders with onset usually occurring in childhood and adolescence: Secondary | ICD-10-CM | POA: Diagnosis not present

## 2017-03-13 MED ORDER — LISDEXAMFETAMINE DIMESYLATE 70 MG PO CAPS: 70.0000 mg | ORAL_CAPSULE | Freq: Every day | ORAL | 0 refills | Status: DC

## 2017-03-13 NOTE — Assessment & Plan Note (Signed)
con't vyvanse for now Refer to psych

## 2017-03-13 NOTE — Progress Notes (Signed)
Subjective:  I acted as a Neurosurgeonscribe for Dr. Delman KittenLowne Ngoc Detjen, LPN    Patient ID: [name], female    DOB: 07/28/1966, 51 y.o.   MRN: [id number]  Chief Complaint  Patient presents with  . Follow-up    HPI  Patient is in today for medication follow up. Patient would like to know if she can switch medication because she feels she has built a tolerance to Vyvanse, it doesn't seem to be working.  Patient Care Team: Donato SchultzYvonne R Lowne Chase, DO as PCP - General (Family Medicine) Consuelo PandyJulie S Whitt, LCSW as Social Worker (Licensed Visual merchandiserClinical Social Worker)   Past Medical History:  Diagnosis Date  . Abdominal pain   . Anemia   . Arthritis   . Bruises easily   . Depression   . Extremity numbness    legs, feet, arms, hands  . Foot fracture    bilateral  . Frequent falls   . Hair loss   . Hypotension   . Irritable bowel   . Kidney stones   . Leg pain    when driving long distances  . Memory loss   . Migraines   . Panic disorder   . Vision problems     Past Surgical History:  Procedure Laterality Date  . ABDOMINAL HYSTERECTOMY    . ABDOMINOPLASTY    . CHOLECYSTECTOMY    . FOOT SURGERY Left    Plantar Fasc  . LAPAROSCOPIC GASTRIC BANDING    . TONSILLECTOMY      Family History  Problem Relation Age of Onset  . Hypertension Mother   . Deep vein thrombosis Mother     Finger  . Panic disorder Mother   . Hypertension Father   . Heart attack Father     Deceased, 2150s  . Diabetes Maternal Grandmother   . Heart disease Maternal Grandmother     chf MI  . Heart disease Maternal Grandfather [id number]    MI    Social History   Social History  . Marital status: Married    Spouse name: N/A  . Number of children: N/A  . Years of education: N/A   Occupational History  . Not on file.   Social History Main Topics  . Smoking status: Current Every Day Smoker    Packs/day: 1.00    Years: 30.00  . Smokeless tobacco: Never Used  . Alcohol use No  . Drug use: No  . Sexual activity:  Yes    Partners: Male   Other Topics Concern  . Not on file   Social History Narrative   She lives with husband.  They have one grown child.   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   Highest level of education:  Associates degree in early childhood    Outpatient Medications Prior to Visit  Medication Sig Dispense Refill  . albuterol (PROAIR HFA) 108 (90 Base) MCG/ACT inhaler Inhale 2 puffs into the lungs every 6 (six) hours as needed for wheezing or shortness of breath. 1 Inhaler 3  . amoxicillin-clavulanate (AUGMENTIN) 875-125 MG tablet Take 1 tablet by mouth 2 (two) times daily. 20 tablet 0  . clonazePAM (KLONOPIN) 1 MG tablet Take 1 tablet (1 mg total) by mouth 3 (three) times daily as needed. 90 tablet 2  . cyanocobalamin (,VITAMIN B-12,) 1000 MCG/ML injection INJECT 1 ML (1,000 MCG TOTAL) INTO THE MUSCLE EVERY 30 (THIRTY) DAYS. 1 mL 5  . fluticasone (FLONASE) 50 MCG/ACT nasal spray Place 2 sprays into  both nostrils daily. 16 g 1  . furosemide (LASIX) 20 MG tablet Take 1 tablet (20 mg total) by mouth daily. 30 tablet 3  . glucose blood (ONE TOUCH ULTRA TEST) test strip TEST 3 TIMES A DAY 100 each 11  . lisinopril-hydrochlorothiazide (PRINZIDE,ZESTORETIC) 10-12.5 MG tablet Take 1 tablet by mouth daily. 90 tablet 1  . LYRICA 50 MG capsule TAKE 1 CAPSULE BY MOUTH DAILY IN AM, 1 CAP AT NOON, AND 2 CAPS AT BEDTIME AS DIRECTED. 120 capsule 3  . meclizine (ANTIVERT) 25 MG tablet Take 1 tablet (25 mg total) by mouth 3 (three) times daily as needed for dizziness. 30 tablet 0  . metFORMIN (GLUCOPHAGE-XR) 500 MG 24 hr tablet TAKE 1 TABLET BY MOUTH IN THE MORNING AND TAKE 2 TABLETS IN THE EVENING 90 tablet 3  . sertraline (ZOLOFT) 100 MG tablet Take 1 tablet (100 mg total) by mouth daily. 30 tablet 3  . SYRINGE-NEEDLE, DISP, 3 ML 25G X 1" 3 ML MISC 10 each by Does not apply route as directed. 10 each 2  . Vitamin D, Ergocalciferol, (DRISDOL) 50000 units CAPS capsule TAKE 1 CAPSULE (50,000  UNITS TOTAL) BY MOUTH EVERY 7 (SEVEN) DAYS. 4 capsule 2  . lisdexamfetamine (VYVANSE) 70 MG capsule Take 1 capsule (70 mg total) by mouth daily. 30 capsule 0  . lisdexamfetamine (VYVANSE) 70 MG capsule Take 1 capsule (70 mg total) by mouth daily. 30 capsule 0  . lisdexamfetamine (VYVANSE) 70 MG capsule Take 1 capsule (70 mg total) by mouth daily. 30 capsule 0   No facility-administered medications prior to visit.     No Known Allergies  Review of Systems  Constitutional: Negative for fever and malaise/fatigue.  HENT: Negative for congestion.   Eyes: Negative for blurred vision.  Respiratory: Negative for cough and shortness of breath.   Cardiovascular: Negative for chest pain, palpitations and leg swelling.  Gastrointestinal: Negative for abdominal pain, blood in stool, nausea and vomiting.  Genitourinary: Negative for dysuria and frequency.  Musculoskeletal: Negative for back pain and falls.  Skin: Negative for rash.  Neurological: Negative for dizziness, loss of consciousness and headaches.  Endo/Heme/Allergies: Negative for environmental allergies.  Psychiatric/Behavioral: Positive for depression. The patient is nervous/anxious.        Objective:    Physical Exam  Constitutional: She is oriented to person, place, and time. She appears well-developed and well-nourished. No distress.  HENT:  Head: Normocephalic and atraumatic.  Eyes: Conjunctivae are normal. Pupils are equal, round, and reactive to light.  Neck: Normal range of motion. No thyromegaly present.  Cardiovascular: Normal rate and regular rhythm.   Pulmonary/Chest: Effort normal and breath sounds normal. She has no wheezes.  Abdominal: Soft. Bowel sounds are normal. There is no tenderness.  Musculoskeletal: Normal range of motion. She exhibits no edema or deformity.  Neurological: She is alert and oriented to person, place, and time.  Skin: Skin is warm and dry. She is not diaphoretic.  Psychiatric: She has a  normal mood and affect.  Nursing note and vitals reviewed.   BP 138/70 (BP Location: Left Arm, Patient Position: Sitting, Cuff Size: Large)   Pulse 96   Temp 98.4 F (36.9 C) (Oral)   Resp 16   Ht 5\' 6"  (1.676 m)   Wt 226 lb 6.4 oz (102.7 kg)   SpO2 97%   BMI 36.54 kg/m  Wt Readings from Last 3 Encounters:  03/13/17 226 lb 6.4 oz (102.7 kg)  03/11/17 225 lb 2 oz (102.1 kg)  04/21/16 229 lb 9.6 oz (104.1 kg)      Immunization History  Administered Date(s) Administered  . Influenza,inj,Quad PF,36+ Mos 11/04/2013, 10/16/2014, 12/28/2015  . Pneumococcal Polysaccharide-23 12/28/2015  . Tdap 06/07/2011    Health Maintenance  Topic Date Due  . OPHTHALMOLOGY EXAM  05/01/1975  . FOOT EXAM  02/06/2015  . COLONOSCOPY  05/01/2015  . INFLUENZA VACCINE  07/29/2016  . HEMOGLOBIN A1C  10/21/2016  . MAMMOGRAM  12/26/2016  . PAP SMEAR  12/27/2018  . PNEUMOCOCCAL POLYSACCHARIDE VACCINE (2) 12/27/2020  . TETANUS/TDAP  06/06/2021  . HIV Screening  Completed    Lab Results  Component Value Date   WBC 9.5 04/21/2016   HGB 16.2 (H) 04/21/2016   HCT 47.9 (H) 04/21/2016   PLT 260.0 04/21/2016   GLUCOSE 96 04/21/2016   CHOL 192 04/21/2016   TRIG 201.0 (H) 04/21/2016   HDL 42.30 04/21/2016   LDLDIRECT 123.0 04/21/2016   LDLCALC 111 (H) 12/28/2015   ALT 18 04/21/2016   AST 20 04/21/2016   NA 134 (L) 04/21/2016   K 4.3 04/21/2016   CL 100 04/21/2016   CREATININE 0.86 04/21/2016   BUN 9 04/21/2016   CO2 27 04/21/2016   TSH 2.09 04/21/2016   INR 0.97 06/23/2014   HGBA1C 6.3 04/21/2016   MICROALBUR 0.89 11/04/2013    Lab Results  Component Value Date   TSH 2.09 04/21/2016   Lab Results  Component Value Date   WBC 9.5 04/21/2016   HGB 16.2 (H) 04/21/2016   HCT 47.9 (H) 04/21/2016   MCV 91.7 04/21/2016   PLT 260.0 04/21/2016   Lab Results  Component Value Date   NA 134 (L) 04/21/2016   K 4.3 04/21/2016   CO2 27 04/21/2016   GLUCOSE 96 04/21/2016   BUN 9 04/21/2016    CREATININE 0.86 04/21/2016   BILITOT 1.0 04/21/2016   ALKPHOS 73 04/21/2016   AST 20 04/21/2016   ALT 18 04/21/2016   PROT 7.3 04/21/2016   ALBUMIN 3.8 04/21/2016   CALCIUM 9.1 04/21/2016   GFR 73.94 04/21/2016   Lab Results  Component Value Date   CHOL 192 04/21/2016   Lab Results  Component Value Date   HDL 42.30 04/21/2016   Lab Results  Component Value Date   LDLCALC 111 (H) 12/28/2015   Lab Results  Component Value Date   TRIG 201.0 (H) 04/21/2016   Lab Results  Component Value Date   CHOLHDL 5 04/21/2016   Lab Results  Component Value Date   HGBA1C 6.3 04/21/2016         Assessment & Plan:   Problem List Items Addressed This Visit      Unprioritized   Clinical depression   Attention deficit disorder (ADD) without hyperactivity - Primary    con't vyvanse for now Refer to psych      Relevant Orders   Ambulatory referral to Psychiatry   Depression with anxiety    Refer to psych      Relevant Orders   Ambulatory referral to Psychiatry   HTN (hypertension)    Well controlled, no changes to meds. Encouraged heart healthy diet such as the DASH diet and exercise as tolerated.        Other Visit Diagnoses    Attention deficit hyperactivity disorder (ADHD), unspecified ADHD type       Relevant Medications   lisdexamfetamine (VYVANSE) 70 MG capsule   lisdexamfetamine (VYVANSE) 70 MG capsule   lisdexamfetamine (VYVANSE) 70 MG capsule  I am having Ms. Jacuinde maintain her furosemide, cyanocobalamin, albuterol, meclizine, sertraline, glucose blood, SYRINGE-NEEDLE (DISP) 3 ML, lisinopril-hydrochlorothiazide, Vitamin D (Ergocalciferol), LYRICA, metFORMIN, amoxicillin-clavulanate, fluticasone, clonazePAM, lisdexamfetamine, lisdexamfetamine, and lisdexamfetamine.  Meds ordered this encounter  Medications  . lisdexamfetamine (VYVANSE) 70 MG capsule    Sig: Take 1 capsule (70 mg total) by mouth daily.    Dispense:  30 capsule    Refill:  0    DO NOT  FILL UNTIL April 2018  . lisdexamfetamine (VYVANSE) 70 MG capsule    Sig: Take 1 capsule (70 mg total) by mouth daily.    Dispense:  30 capsule    Refill:  0    DO NOT FILL UNTIL MAY 2018  . lisdexamfetamine (VYVANSE) 70 MG capsule    Sig: Take 1 capsule (70 mg total) by mouth daily.    Dispense:  30 capsule    Refill:  0    DO NOT FILL UNTIL MARCH 2018    CMA served as scribe during this visit. History, Physical and Plan performed by medical provider. Documentation and orders reviewed and attested to.  Donato Schultz, DO   Patient ID: PEARLA MCKINNY, female   DOB: [birth year], 51 y.o.   MRN: [id number]

## 2017-03-13 NOTE — Progress Notes (Signed)
Pre visit review using our clinic review tool, if applicable. No additional management support is needed unless otherwise documented below in the visit note. 

## 2017-03-13 NOTE — Assessment & Plan Note (Signed)
Well controlled, no changes to meds. Encouraged heart healthy diet such as the DASH diet and exercise as tolerated.  °

## 2017-03-13 NOTE — Patient Instructions (Addendum)
f/u 6 months for add

## 2017-03-13 NOTE — Assessment & Plan Note (Signed)
Refer to psych 

## 2017-04-03 ENCOUNTER — Other Ambulatory Visit: Payer: Self-pay | Admitting: Family Medicine

## 2017-04-03 ENCOUNTER — Encounter: Payer: Self-pay | Admitting: Family Medicine

## 2017-04-03 DIAGNOSIS — G894 Chronic pain syndrome: Secondary | ICD-10-CM

## 2017-04-03 MED ORDER — PREGABALIN 50 MG PO CAPS: ORAL_CAPSULE | ORAL | 3 refills | Status: DC

## 2017-04-03 MED ORDER — GABAPENTIN 100 MG PO CAPS: 100.0000 mg | ORAL_CAPSULE | Freq: Three times a day (TID) | ORAL | 3 refills | Status: DC

## 2017-04-07 NOTE — Telephone Encounter (Signed)
She had also requested Lyrica.

## 2017-04-12 ENCOUNTER — Other Ambulatory Visit: Payer: Self-pay | Admitting: Family Medicine

## 2017-04-12 DIAGNOSIS — F411 Generalized anxiety disorder: Secondary | ICD-10-CM

## 2017-04-28 ENCOUNTER — Other Ambulatory Visit: Payer: Self-pay | Admitting: Family Medicine

## 2017-04-28 DIAGNOSIS — E538 Deficiency of other specified B group vitamins: Secondary | ICD-10-CM

## 2017-04-28 MED ORDER — CYANOCOBALAMIN 1000 MCG/ML IJ SOLN: INTRAMUSCULAR | 5 refills | Status: DC

## 2017-06-06 ENCOUNTER — Other Ambulatory Visit: Payer: Self-pay | Admitting: Family Medicine

## 2017-06-09 ENCOUNTER — Other Ambulatory Visit: Payer: 59

## 2017-06-09 ENCOUNTER — Encounter: Payer: Self-pay | Admitting: Family Medicine

## 2017-06-09 ENCOUNTER — Telehealth: Payer: Self-pay | Admitting: *Deleted

## 2017-06-09 ENCOUNTER — Ambulatory Visit (INDEPENDENT_AMBULATORY_CARE_PROVIDER_SITE_OTHER): Payer: 59 | Admitting: Family Medicine

## 2017-06-09 VITALS — BP 118/78 | HR 103 | Temp 98.3°F | Ht 65.0 in | Wt 223.0 lb

## 2017-06-09 DIAGNOSIS — E785 Hyperlipidemia, unspecified: Secondary | ICD-10-CM

## 2017-06-09 DIAGNOSIS — E1151 Type 2 diabetes mellitus with diabetic peripheral angiopathy without gangrene: Secondary | ICD-10-CM

## 2017-06-09 DIAGNOSIS — F411 Generalized anxiety disorder: Secondary | ICD-10-CM

## 2017-06-09 DIAGNOSIS — F418 Other specified anxiety disorders: Secondary | ICD-10-CM | POA: Diagnosis not present

## 2017-06-09 DIAGNOSIS — G629 Polyneuropathy, unspecified: Secondary | ICD-10-CM | POA: Diagnosis not present

## 2017-06-09 DIAGNOSIS — I10 Essential (primary) hypertension: Secondary | ICD-10-CM | POA: Diagnosis not present

## 2017-06-09 DIAGNOSIS — IMO0002 Reserved for concepts with insufficient information to code with codable children: Secondary | ICD-10-CM

## 2017-06-09 DIAGNOSIS — Z780 Asymptomatic menopausal state: Secondary | ICD-10-CM

## 2017-06-09 DIAGNOSIS — Z8639 Personal history of other endocrine, nutritional and metabolic disease: Secondary | ICD-10-CM

## 2017-06-09 DIAGNOSIS — F909 Attention-deficit hyperactivity disorder, unspecified type: Secondary | ICD-10-CM

## 2017-06-09 DIAGNOSIS — E538 Deficiency of other specified B group vitamins: Secondary | ICD-10-CM | POA: Diagnosis not present

## 2017-06-09 DIAGNOSIS — E1165 Type 2 diabetes mellitus with hyperglycemia: Secondary | ICD-10-CM

## 2017-06-09 MED ORDER — LISDEXAMFETAMINE DIMESYLATE 70 MG PO CAPS: 70.0000 mg | ORAL_CAPSULE | Freq: Every day | ORAL | 0 refills | Status: DC

## 2017-06-09 MED ORDER — CITALOPRAM HYDROBROMIDE 10 MG PO TABS: 10.0000 mg | ORAL_TABLET | Freq: Every day | ORAL | 2 refills | Status: DC

## 2017-06-09 MED ORDER — CLONAZEPAM 1 MG PO TABS: 1.0000 mg | ORAL_TABLET | Freq: Three times a day (TID) | ORAL | 2 refills | Status: DC | PRN

## 2017-06-09 NOTE — Telephone Encounter (Signed)
Refill 3 months vyvanse Refill clonazepam x1

## 2017-06-09 NOTE — Progress Notes (Signed)
Pre visit review using our clinic review tool, if applicable. No additional management support is needed unless otherwise documented below in the visit note. 

## 2017-06-09 NOTE — Progress Notes (Signed)
Patient ID: [name], female    DOB: 05/30/1966  Age: 51 y.o. MRN: [id number]    Subjective:  Subjective  HPI Faith Bachelorngela G Choo presents for f/u dm but has several other concerns-- she is very frustrated because no one can figure out the problem with the nerve pain in her feel/ legs --- she is cont' running into things  She was not able to go to pain management because she was not a candidate for there research on RDS.     Review of Systems  Constitutional: Positive for fatigue. Negative for appetite change, diaphoresis and unexpected weight change.  HENT: Positive for sore throat and trouble swallowing.   Eyes: Negative for pain, redness and visual disturbance.  Respiratory: Negative for cough, chest tightness, shortness of breath and wheezing.   Cardiovascular: Negative for chest pain, palpitations and leg swelling.  Endocrine: Negative for cold intolerance, heat intolerance, polydipsia, polyphagia and polyuria.  Genitourinary: Negative for difficulty urinating, dysuria and frequency.  Musculoskeletal: Positive for arthralgias and gait problem. Negative for back pain and joint swelling.  Neurological: Positive for numbness. Negative for dizziness, light-headedness and headaches.    History Past Medical History:  Diagnosis Date  . Abdominal pain   . Anemia   . Arthritis   . Bruises easily   . Depression   . Extremity numbness    legs, feet, arms, hands  . Foot fracture    bilateral  . Frequent falls   . Hair loss   . Hypotension   . Irritable bowel   . Kidney stones   . Leg pain    when driving long distances  . Memory loss   . Migraines   . Panic disorder   . Vision problems     She has a past surgical history that includes Cholecystectomy; Laparoscopic gastric banding; Foot surgery (Left); Abdominoplasty; Abdominal hysterectomy; and Tonsillectomy.   Her family history includes Deep vein thrombosis in her mother; Diabetes in her maternal grandmother; Heart attack  in her father; Heart disease in her maternal grandmother; Heart disease (age of onset: 7880) in her maternal grandfather; Hypertension in her father and mother; Panic disorder in her mother.She reports that she has been smoking.  She has a 30.00 pack-year smoking history. She has never used smokeless tobacco. She reports that she does not drink alcohol or use drugs.  Current Outpatient Prescriptions on File Prior to Visit  Medication Sig Dispense Refill  . albuterol (PROAIR HFA) 108 (90 Base) MCG/ACT inhaler Inhale 2 puffs into the lungs every 6 (six) hours as needed for wheezing or shortness of breath. 1 Inhaler 3  . cyanocobalamin (,VITAMIN B-12,) 1000 MCG/ML injection INJECT 1 ML (1,000 MCG TOTAL) INTO THE MUSCLE EVERY 30 (THIRTY) DAYS. 1 mL 5  . fluticasone (FLONASE) 50 MCG/ACT nasal spray Place 2 sprays into both nostrils daily. 16 g 1  . furosemide (LASIX) 20 MG tablet Take 1 tablet (20 mg total) by mouth daily. 30 tablet 3  . gabapentin (NEURONTIN) 100 MG capsule Take 1 capsule (100 mg total) by mouth 3 (three) times daily. 90 capsule 3  . glucose blood (ONE TOUCH ULTRA TEST) test strip TEST 3 TIMES A DAY 100 each 11  . lisinopril-hydrochlorothiazide (PRINZIDE,ZESTORETIC) 10-12.5 MG tablet Take 1 tablet by mouth daily. 90 tablet 1  . meclizine (ANTIVERT) 25 MG tablet Take 1 tablet (25 mg total) by mouth 3 (three) times daily as needed for dizziness. 30 tablet 0  . metFORMIN (GLUCOPHAGE-XR) 500 MG 24 hr  tablet TAKE 1 TABLET BY MOUTH IN THE MORNING AND TAKE 2 TABLETS IN THE EVENING 90 tablet 3  . SYRINGE-NEEDLE, DISP, 3 ML 25G X 1" 3 ML MISC 10 each by Does not apply route as directed. 10 each 2  . Vitamin D, Ergocalciferol, (DRISDOL) 50000 units CAPS capsule TAKE 1 CAPSULE (50,000 UNITS TOTAL) BY MOUTH EVERY 7 (SEVEN) DAYS. 4 capsule 2   No current facility-administered medications on file prior to visit.      Objective:  Objective  Physical Exam  Constitutional: She is oriented to person,  place, and time. She appears well-developed and well-nourished.  HENT:  Head: Normocephalic and atraumatic.  Eyes: Conjunctivae and EOM are normal.  Neck: Normal range of motion. Neck supple. No JVD present. Carotid bruit is not present. No thyromegaly present.  Cardiovascular: Normal rate, regular rhythm and normal heart sounds.   No murmur heard. Pulmonary/Chest: Effort normal and breath sounds normal. No respiratory distress. She has no wheezes. She has no rales. She exhibits no tenderness.  Musculoskeletal: She exhibits tenderness. She exhibits no edema.  Neurological: She is alert and oriented to person, place, and time. She has normal strength and normal reflexes. A sensory deficit is present. No cranial nerve deficit.  Psychiatric: She has a normal mood and affect. Her behavior is normal. Judgment and thought content normal.  Nursing note and vitals reviewed. monofilament--- unable to feel feet  BP 118/78 (BP Location: Left Arm, Patient Position: Sitting, Cuff Size: Normal)   Pulse (!) 103   Temp 98.3 F (36.8 C) (Oral)   Ht 5\' 5"  (1.651 m)   Wt 223 lb (101.2 kg)   SpO2 97%   BMI 37.11 kg/m  Wt Readings from Last 3 Encounters:  06/09/17 223 lb (101.2 kg)  03/13/17 226 lb 6.4 oz (102.7 kg)  03/11/17 225 lb 2 oz (102.1 kg)     Lab Results  Component Value Date   WBC 9.5 04/21/2016   HGB 16.2 (H) 04/21/2016   HCT 47.9 (H) 04/21/2016   PLT 260.0 04/21/2016   GLUCOSE 96 04/21/2016   CHOL 192 04/21/2016   TRIG 201.0 (H) 04/21/2016   HDL 42.30 04/21/2016   LDLDIRECT 123.0 04/21/2016   LDLCALC 111 (H) 12/28/2015   ALT 18 04/21/2016   AST 20 04/21/2016   NA 134 (L) 04/21/2016   K 4.3 04/21/2016   CL 100 04/21/2016   CREATININE 0.86 04/21/2016   BUN 9 04/21/2016   CO2 27 04/21/2016   TSH 2.09 04/21/2016   INR 0.97 06/23/2014   HGBA1C 6.3 04/21/2016   MICROALBUR 0.89 11/04/2013    No results found.   Assessment & Plan:  Plan  I have discontinued Ms. Kiene's  sertraline and amoxicillin-clavulanate. I am also having her start on citalopram. Additionally, I am having her maintain her furosemide, albuterol, meclizine, glucose blood, SYRINGE-NEEDLE (DISP) 3 ML, lisinopril-hydrochlorothiazide, Vitamin D (Ergocalciferol), fluticasone, gabapentin, cyanocobalamin, and metFORMIN.  Meds ordered this encounter  Medications  . citalopram (CELEXA) 10 MG tablet    Sig: Take 1 tablet (10 mg total) by mouth daily.    Dispense:  30 tablet    Refill:  2    Problem List Items Addressed This Visit      Unprioritized   Depression with anxiety    stable      Relevant Medications   citalopram (CELEXA) 10 MG tablet   DM (diabetes mellitus) type II uncontrolled, periph vascular disorder (HCC)    Check labs hgba1c to be checked,  minimize simple carbs. Increase exercise as tolerated. Continue current meds       Relevant Orders   CBC with Differential/Platelet   Comprehensive metabolic panel   Hemoglobin A1c   History of goiter    Check labs US thyroid      Relevant Orders   TSH   T3, free   T4, free   HTN (hypertension)    Well controlled, no changes to meds. Encouraged heart healthy diet such as the DASH diet and exercise as tolerated.       Hyperlipidemia    Tolerating statin, encouraged heart healthy diet, avoid trans fats, minimize simple carbs and saturated fats. Increase exercise as tolerated      Relevant Orders   Comprehensive metabolic panel   Menopause - Primary    Check labs      Relevant Orders   Estradiol (Completed)   FSH   Peripheral polyneuropathy   Relevant Medications   citalopram (CELEXA) 10 MG tablet   Other Relevant Orders   Ambulatory referral to Neurology   Vitamin B12 deficiency    Check labs today      Relevant Orders   Vitamin B12      Follow-up: Return in about 6 months (around 12/09/2017) for hypertension, hyperlipidemia, diabetes II.  Donato Schultz, DO

## 2017-06-09 NOTE — Telephone Encounter (Signed)
Patient will pickup 06/10/17

## 2017-06-09 NOTE — Patient Instructions (Signed)

## 2017-06-09 NOTE — Telephone Encounter (Signed)
rx refill for clonazepam and vyvanse  Last filled per database 05/21/17 Last written 03/13/17 vyvanse 03/11/17 clonazepam Last ov 06/09/17 Next ov 12/10/17 Contract signed 06/09/17 UDS 06/09/17  Database on your desk.  She forgot to ask for refill.

## 2017-06-10 DIAGNOSIS — G629 Polyneuropathy, unspecified: Secondary | ICD-10-CM | POA: Insufficient documentation

## 2017-06-10 DIAGNOSIS — E1165 Type 2 diabetes mellitus with hyperglycemia: Secondary | ICD-10-CM

## 2017-06-10 DIAGNOSIS — Z780 Asymptomatic menopausal state: Secondary | ICD-10-CM | POA: Insufficient documentation

## 2017-06-10 DIAGNOSIS — E1151 Type 2 diabetes mellitus with diabetic peripheral angiopathy without gangrene: Secondary | ICD-10-CM | POA: Insufficient documentation

## 2017-06-10 DIAGNOSIS — Z8639 Personal history of other endocrine, nutritional and metabolic disease: Secondary | ICD-10-CM | POA: Insufficient documentation

## 2017-06-10 DIAGNOSIS — IMO0002 Reserved for concepts with insufficient information to code with codable children: Secondary | ICD-10-CM | POA: Insufficient documentation

## 2017-06-10 LAB — COMPREHENSIVE METABOLIC PANEL
ALBUMIN: 4.2 g/dL (ref 3.5–5.2)
ALK PHOS: 72 U/L (ref 39–117)
ALT: 15 U/L (ref 0–35)
AST: 17 U/L (ref 0–37)
BUN: 8 mg/dL (ref 6–23)
CALCIUM: 10.1 mg/dL (ref 8.4–10.5)
CO2: 26 mEq/L (ref 19–32)
CREATININE: 0.84 mg/dL (ref 0.40–1.20)
Chloride: 105 mEq/L (ref 96–112)
GFR: 75.64 mL/min (ref >60.00)
Glucose, Bld: 86 mg/dL (ref 70–99)
POTASSIUM: 4.6 meq/L (ref 3.5–5.1)
SODIUM: 138 meq/L (ref 135–145)
TOTAL PROTEIN: 7.5 g/dL (ref 6.0–8.3)
Total Bilirubin: 0.6 mg/dL (ref 0.2–1.2)

## 2017-06-10 LAB — CBC WITH DIFFERENTIAL/PLATELET
Basophils Absolute: 0.1 10*3/uL (ref 0.0–0.1)
Basophils Relative: 1.1 % (ref 0.0–3.0)
Eosinophils Absolute: 0.3 10*3/uL (ref 0.0–0.7)
Eosinophils Relative: 3.2 % (ref 0.0–5.0)
HEMATOCRIT: 48.1 % — AB (ref 36.0–46.0)
HEMOGLOBIN: 16.3 g/dL — AB (ref 12.0–15.0)
LYMPHS PCT: 27.6 % (ref 12.0–46.0)
Lymphs Abs: 2.3 10*3/uL (ref 0.7–4.0)
MCHC: 33.8 g/dL (ref 30.0–36.0)
MCV: 93.2 fl (ref 78.0–100.0)
MONO ABS: 0.5 10*3/uL (ref 0.1–1.0)
MONOS PCT: 6.5 % (ref 3.0–12.0)
Neutro Abs: 5.1 10*3/uL (ref 1.4–7.7)
Neutrophils Relative %: 61.6 % (ref 43.0–77.0)
Platelets: 286 10*3/uL (ref 150.0–400.0)
RBC: 5.17 Mil/uL — AB (ref 3.87–5.11)
RDW: 13.7 % (ref 11.5–15.5)
WBC: 8.2 10*3/uL (ref 4.0–10.5)

## 2017-06-10 LAB — ESTRADIOL: ESTRADIOL: 29 pg/mL

## 2017-06-10 LAB — VITAMIN B12

## 2017-06-10 LAB — FOLLICLE STIMULATING HORMONE: FSH: 6.8 m[IU]/mL

## 2017-06-10 LAB — T4, FREE: FREE T4: 0.63 ng/dL (ref 0.60–1.60)

## 2017-06-10 LAB — HEMOGLOBIN A1C: HEMOGLOBIN A1C: 6.1 % (ref 4.6–6.5)

## 2017-06-10 LAB — TSH: TSH: 1.02 u[IU]/mL (ref 0.35–4.50)

## 2017-06-10 LAB — T3, FREE: T3, Free: 3.3 pg/mL (ref 2.3–4.2)

## 2017-06-10 NOTE — Assessment & Plan Note (Signed)
Well controlled, no changes to meds. Encouraged heart healthy diet such as the DASH diet and exercise as tolerated.  °

## 2017-06-10 NOTE — Assessment & Plan Note (Signed)
Check labs [place] thyroid

## 2017-06-10 NOTE — Assessment & Plan Note (Signed)
stable °

## 2017-06-10 NOTE — Telephone Encounter (Signed)
Left message machine that rx is ready for pickup.

## 2017-06-10 NOTE — Assessment & Plan Note (Signed)
Check labs  hgba1c to be checked, minimize simple carbs. Increase exercise as tolerated. Continue current meds  

## 2017-06-10 NOTE — Assessment & Plan Note (Signed)
Tolerating statin, encouraged heart healthy diet, avoid trans fats, minimize simple carbs and saturated fats. Increase exercise as tolerated 

## 2017-06-10 NOTE — Assessment & Plan Note (Signed)
Check labs today.

## 2017-06-10 NOTE — Assessment & Plan Note (Signed)
Check labs 

## 2017-06-11 ENCOUNTER — Encounter: Payer: Self-pay | Admitting: Family Medicine

## 2017-06-16 ENCOUNTER — Other Ambulatory Visit: Payer: Self-pay | Admitting: Medical

## 2017-06-16 DIAGNOSIS — F411 Generalized anxiety disorder: Secondary | ICD-10-CM

## 2017-07-31 ENCOUNTER — Other Ambulatory Visit: Payer: Self-pay | Admitting: Family Medicine

## 2017-07-31 DIAGNOSIS — G894 Chronic pain syndrome: Secondary | ICD-10-CM

## 2017-07-31 DIAGNOSIS — E1140 Type 2 diabetes mellitus with diabetic neuropathy, unspecified: Secondary | ICD-10-CM

## 2017-07-31 NOTE — Telephone Encounter (Signed)
Last refill for Gabapentin  On 04/03/2017  #90 with 3 refills Last office visit 06/09/2017

## 2017-08-29 ENCOUNTER — Other Ambulatory Visit: Payer: Self-pay | Admitting: Internal Medicine

## 2017-08-29 DIAGNOSIS — G894 Chronic pain syndrome: Secondary | ICD-10-CM

## 2017-09-03 ENCOUNTER — Other Ambulatory Visit: Payer: Self-pay | Admitting: Internal Medicine

## 2017-09-03 DIAGNOSIS — G894 Chronic pain syndrome: Secondary | ICD-10-CM

## 2017-09-25 ENCOUNTER — Other Ambulatory Visit: Payer: Self-pay | Admitting: Family Medicine

## 2017-09-25 DIAGNOSIS — G894 Chronic pain syndrome: Secondary | ICD-10-CM

## 2017-09-25 DIAGNOSIS — E538 Deficiency of other specified B group vitamins: Secondary | ICD-10-CM

## 2017-09-25 DIAGNOSIS — F411 Generalized anxiety disorder: Secondary | ICD-10-CM

## 2017-09-25 NOTE — Telephone Encounter (Signed)
Requesting: --  Clonazepam and Gabapentin Contract --  Signed on 06/09/2017 UDS --   Low risk next due on 12/09/2017 Last OV --  06/09/2017----next scheduled OV is on 12/10/2017 Last Refill  --  Clonazepam---#90 with 2 refills on 06/09/2017                      Gabapentin---#90 no refills on 09/01/2017  Please Advise

## 2017-09-28 ENCOUNTER — Other Ambulatory Visit: Payer: Self-pay | Admitting: Family Medicine

## 2017-09-28 DIAGNOSIS — F418 Other specified anxiety disorders: Secondary | ICD-10-CM

## 2017-09-28 NOTE — Telephone Encounter (Signed)
Received fax from pharmacy stating patient says gabapentin should be increased to  daily. Left message for pt to return my call to clarify request. I did not see any mention in previous office that said she would be increasing dose. Current dose in chart is  three times daily. Need clarification.

## 2017-09-29 ENCOUNTER — Telehealth: Payer: Self-pay | Admitting: Family Medicine

## 2017-09-29 DIAGNOSIS — F909 Attention-deficit hyperactivity disorder, unspecified type: Secondary | ICD-10-CM

## 2017-09-29 MED ORDER — LISDEXAMFETAMINE DIMESYLATE 70 MG PO CAPS: 70.0000 mg | ORAL_CAPSULE | Freq: Every day | ORAL | 0 refills | Status: DC

## 2017-09-29 NOTE — Telephone Encounter (Signed)
Rx printed and given to covering PCP to sign.

## 2017-09-29 NOTE — Telephone Encounter (Signed)
Okay to refill 1 

## 2017-09-29 NOTE — Telephone Encounter (Signed)
Spoke with pt. She states she is currently taking gabapentin  three times daily for her feet. States last visit PCP told her that we could increased gabapentin   if pain continued. Advised pt that PCP would return on Thursday and would address request at that time. Pt requesting increase in dose.  Please advise dose and directions?

## 2017-09-29 NOTE — Telephone Encounter (Signed)
Rx has been placed at the front desk for pick up at Pt's convenience.

## 2017-09-29 NOTE — Telephone Encounter (Signed)
Dr Drue Novel-- please advise in PCP's absence?  Last RX: 06/09/17 for June, July and August. Last OV: 06/09/17 Next OV:  12/10/17 UDS: 06/09/17, low and due 11/2017.

## 2017-10-01 NOTE — Telephone Encounter (Signed)
gabepentin 100 mg 2 po tid  180 , 2refills

## 2017-10-02 ENCOUNTER — Telehealth: Payer: Self-pay | Admitting: Family Medicine

## 2017-10-02 DIAGNOSIS — F411 Generalized anxiety disorder: Secondary | ICD-10-CM

## 2017-10-02 MED ORDER — GABAPENTIN 100 MG PO CAPS: 200.0000 mg | ORAL_CAPSULE | Freq: Three times a day (TID) | ORAL | 2 refills | Status: DC

## 2017-10-02 MED ORDER — CLONAZEPAM 1 MG PO TABS: 1.0000 mg | ORAL_TABLET | Freq: Three times a day (TID) | ORAL | 0 refills | Status: DC | PRN

## 2017-10-02 NOTE — Telephone Encounter (Signed)
Called and spoke with [name] at [facility]. He states they already dispensed 30 tablets and will need new rx for greater quantity when current rx (10 day supply) runs out. Verbal give for 1 tablet three times daily, #90 x no refills. Left detailed message on pt's voicemail.

## 2017-10-02 NOTE — Telephone Encounter (Signed)
Pt is aware.  

## 2017-10-02 NOTE — Telephone Encounter (Signed)
I'm Out Of Office

## 2017-10-02 NOTE — Telephone Encounter (Signed)
Pt says that pharmacy would only give her a 30 day supply of medication (clonazePAM) showing that Rx was written for 90. Pt would like to know if someone could call pharmacy to see why and okay the pharmacist to give the complete Rx.

## 2017-10-02 NOTE — Telephone Encounter (Signed)
Gabapentin Rx has been sent. 

## 2017-10-02 NOTE — Addendum Note (Signed)
Addended by: Mervin Kung A on: 10/02/2017 10:51 AM   Modules accepted: Orders

## 2017-10-02 NOTE — Telephone Encounter (Signed)
Called pt lmg that [facility] approved medications.

## 2017-10-14 ENCOUNTER — Other Ambulatory Visit: Payer: Self-pay | Admitting: Family

## 2017-10-14 DIAGNOSIS — F411 Generalized anxiety disorder: Secondary | ICD-10-CM

## 2017-10-14 NOTE — Telephone Encounter (Signed)
Received call from pt stating that pharmacy did not have Clonazepam rx we called in for 30 day supply. Spoke [facility] at [facility]. He confirmed rx that was placed on hold and will fill for pt but it is currently out of stock and will be available for pt tomorrow. Notified pt. She states she has 1 more tablet left for today and will pick up Rx tomorrow. Advised pt if they are not able to get Rx tomorrow to ask them if they have it available at another one of their locations and she voices understanding.

## 2017-10-28 ENCOUNTER — Ambulatory Visit: Payer: Self-pay

## 2017-11-02 ENCOUNTER — Other Ambulatory Visit: Payer: Self-pay | Admitting: *Deleted

## 2017-11-02 MED ORDER — METFORMIN HCL ER 500 MG PO TB24: ORAL_TABLET | ORAL | 1 refills | Status: DC

## 2017-11-03 ENCOUNTER — Telehealth: Payer: Self-pay | Admitting: Family Medicine

## 2017-11-03 ENCOUNTER — Other Ambulatory Visit: Payer: Self-pay

## 2017-11-03 ENCOUNTER — Ambulatory Visit (INDEPENDENT_AMBULATORY_CARE_PROVIDER_SITE_OTHER): Payer: 59

## 2017-11-03 DIAGNOSIS — E538 Deficiency of other specified B group vitamins: Secondary | ICD-10-CM

## 2017-11-03 DIAGNOSIS — G894 Chronic pain syndrome: Secondary | ICD-10-CM

## 2017-11-03 DIAGNOSIS — Z23 Encounter for immunization: Secondary | ICD-10-CM

## 2017-11-03 DIAGNOSIS — F909 Attention-deficit hyperactivity disorder, unspecified type: Secondary | ICD-10-CM

## 2017-11-03 MED ORDER — CYANOCOBALAMIN 1000 MCG/ML IJ SOLN: INTRAMUSCULAR | 5 refills | Status: DC

## 2017-11-03 NOTE — Telephone Encounter (Signed)
Ok to print 3 months

## 2017-11-03 NOTE — Telephone Encounter (Signed)
Caller name: Kerney Elbengela Kopper  Relation to pt: self  Call back number: [phone] [facility]acy: [phone] - [place], [place] - [address]  Reason for call: Pt is needing refill on lisdexamfetamine (VYVANSE) 70 MG capsule and gabapentin (NEURONTIN) 200 MG capsule. Please advise.

## 2017-11-03 NOTE — Telephone Encounter (Signed)
Patient requests refill for Vyvanse  Database ran and is on your desk  Last filled per database: 10/02/17 Last written: 09/29/17 Last ov: 06/14/17 Next ov: 12/10/17 Contract: 06/09/17 UDS: 06/09/17

## 2017-11-04 MED ORDER — LISDEXAMFETAMINE DIMESYLATE 70 MG PO CAPS: 70.0000 mg | ORAL_CAPSULE | Freq: Every day | ORAL | 0 refills | Status: DC

## 2017-11-04 NOTE — Telephone Encounter (Signed)
rx printed and is on provider desk awaiting signature.

## 2017-11-05 MED ORDER — GABAPENTIN 100 MG PO CAPS: 200.0000 mg | ORAL_CAPSULE | Freq: Three times a day (TID) | ORAL | 2 refills | Status: DC

## 2017-11-05 NOTE — Telephone Encounter (Signed)
Left message on vm that rx is ready for pickup.  neurontin sent to pharmacy.  vyvanse is at front desk for pickup.

## 2017-11-06 NOTE — Telephone Encounter (Signed)
Pt called back in. I made her aware of message below. Pt will be in to pick up by 5 (close)

## 2017-11-24 ENCOUNTER — Telehealth: Payer: Self-pay | Admitting: Family Medicine

## 2017-11-24 NOTE — Telephone Encounter (Signed)
Copied from CRM [id number]. Topic: Quick Communication - Rx Refill/Question >> Nov 24, 2017  4:56 PM Clack, Princella PellegriniJessica D wrote: Has the patient contacted their pharmacy? Yes.     (Agent: If no, request that the patient contact the pharmacy for the refill.)   Preferred Pharmacy (with phone number or street name):   [id number] [place], [place] - [address] [phone] (Phone) [phone] (Fax)  Pt states that she is out of the medication, pt would like the CMA to give her a call.    Agent: Please be advised that RX refills may take up to 48 hours. We ask that you follow-up with your pharmacy.

## 2017-11-25 NOTE — Telephone Encounter (Signed)
Patient requesting refill for clonazepam  Database ran and is on your desk for your review  Last filled per database: 11/06/17 Last written: 11/04/17 Last ov: 06/09/17  Next ov: 12/10/17 Contract: 06/09/17 UDS: due 12/09/17

## 2017-11-25 NOTE — Telephone Encounter (Signed)
Per the pt she  has had multiple requests for the clonazepam. Pt states she has multiple stressors in her life and needs the medicine called in. Please call pt when medicine is called in to pharmacy.

## 2017-11-25 NOTE — Telephone Encounter (Signed)
Pt  Is  Requesting  A  Refill  Of  Klonipin    And  Would  Like  The  CMA to  Give  Her  A  Call

## 2017-11-25 NOTE — Telephone Encounter (Signed)
Checking status of refill, patient states she out of meds. Please advise. Patient is requesting call back [phone]

## 2017-11-25 NOTE — Telephone Encounter (Signed)
Request has been sent to PCP whom is out of the office today.

## 2017-11-25 NOTE — Telephone Encounter (Signed)
This encounter was created in error - please disregard.

## 2017-11-26 ENCOUNTER — Other Ambulatory Visit: Payer: Self-pay | Admitting: Family Medicine

## 2017-11-26 DIAGNOSIS — F411 Generalized anxiety disorder: Secondary | ICD-10-CM

## 2017-11-26 MED ORDER — CLONAZEPAM 1 MG PO TABS: 1.0000 mg | ORAL_TABLET | Freq: Three times a day (TID) | ORAL | 2 refills | Status: DC | PRN

## 2017-11-26 NOTE — Telephone Encounter (Signed)
Left detailed message on machine.

## 2017-11-26 NOTE — Telephone Encounter (Signed)
It was sent to pharmacy.

## 2017-12-01 ENCOUNTER — Other Ambulatory Visit: Payer: Self-pay | Admitting: Family Medicine

## 2017-12-01 DIAGNOSIS — E1140 Type 2 diabetes mellitus with diabetic neuropathy, unspecified: Secondary | ICD-10-CM

## 2017-12-03 ENCOUNTER — Encounter (HOSPITAL_COMMUNITY): Payer: Self-pay

## 2017-12-10 ENCOUNTER — Ambulatory Visit: Payer: Self-pay | Admitting: Family Medicine

## 2017-12-10 ENCOUNTER — Telehealth: Payer: Self-pay | Admitting: *Deleted

## 2017-12-10 NOTE — Telephone Encounter (Signed)
[facility] that pt has called and is stuck in the snow and having to be towed and will not be able to make appt with PCP, Zola ButtonLowne Chase today. Appointment has been cancelled and pt will call back to schedule. Please do not charge late cancellation fee. Thanks!

## 2017-12-14 ENCOUNTER — Other Ambulatory Visit: Payer: Self-pay | Admitting: *Deleted

## 2017-12-14 DIAGNOSIS — E1140 Type 2 diabetes mellitus with diabetic neuropathy, unspecified: Secondary | ICD-10-CM

## 2017-12-14 MED ORDER — "SYRINGE/NEEDLE (DISP) 25G X 1"" 3 ML MISC": 10.0000 | 2 refills | Status: DC

## 2017-12-15 ENCOUNTER — Encounter: Payer: Self-pay | Admitting: Family Medicine

## 2017-12-15 ENCOUNTER — Ambulatory Visit: Payer: 59 | Admitting: Family Medicine

## 2017-12-15 VITALS — BP 190/120 | HR 90 | Temp 97.9°F | Ht 65.0 in | Wt 221.0 lb

## 2017-12-15 DIAGNOSIS — E1151 Type 2 diabetes mellitus with diabetic peripheral angiopathy without gangrene: Secondary | ICD-10-CM | POA: Diagnosis not present

## 2017-12-15 DIAGNOSIS — E039 Hypothyroidism, unspecified: Secondary | ICD-10-CM | POA: Diagnosis not present

## 2017-12-15 DIAGNOSIS — IMO0002 Reserved for concepts with insufficient information to code with codable children: Secondary | ICD-10-CM

## 2017-12-15 DIAGNOSIS — G6289 Other specified polyneuropathies: Secondary | ICD-10-CM

## 2017-12-15 DIAGNOSIS — F332 Major depressive disorder, recurrent severe without psychotic features: Secondary | ICD-10-CM | POA: Diagnosis not present

## 2017-12-15 DIAGNOSIS — E1165 Type 2 diabetes mellitus with hyperglycemia: Secondary | ICD-10-CM | POA: Diagnosis not present

## 2017-12-15 DIAGNOSIS — I10 Essential (primary) hypertension: Secondary | ICD-10-CM

## 2017-12-15 DIAGNOSIS — E785 Hyperlipidemia, unspecified: Secondary | ICD-10-CM | POA: Diagnosis not present

## 2017-12-15 DIAGNOSIS — E1169 Type 2 diabetes mellitus with other specified complication: Secondary | ICD-10-CM | POA: Diagnosis not present

## 2017-12-15 LAB — POC URINALSYSI DIPSTICK (AUTOMATED)
Bilirubin, UA: NEGATIVE
Blood, UA: NEGATIVE
Glucose, UA: NEGATIVE
KETONES UA: NEGATIVE
Leukocytes, UA: NEGATIVE
Nitrite, UA: NEGATIVE
PH UA: 6 (ref 5.0–8.0)
PROTEIN UA: NEGATIVE
SPEC GRAV UA: 1.025 (ref 1.010–1.025)
UROBILINOGEN UA: 0.2 U/dL

## 2017-12-15 LAB — T3, FREE: T3, Free: 3.5 pg/mL (ref 2.3–4.2)

## 2017-12-15 LAB — LDL CHOLESTEROL, DIRECT: LDL DIRECT: 141 mg/dL

## 2017-12-15 LAB — COMPREHENSIVE METABOLIC PANEL
ALBUMIN: 4.2 g/dL (ref 3.5–5.2)
ALK PHOS: 79 U/L (ref 39–117)
ALT: 20 U/L (ref 0–35)
AST: 21 U/L (ref 0–37)
BUN: 7 mg/dL (ref 6–23)
CHLORIDE: 102 meq/L (ref 96–112)
CO2: 28 mEq/L (ref 19–32)
Calcium: 9.5 mg/dL (ref 8.4–10.5)
Creatinine, Ser: 0.81 mg/dL (ref 0.40–1.20)
GFR: 78.73 mL/min (ref >60.00)
GLUCOSE: 73 mg/dL (ref 70–99)
POTASSIUM: 4.1 meq/L (ref 3.5–5.1)
SODIUM: 139 meq/L (ref 135–145)
Total Bilirubin: 1 mg/dL (ref 0.2–1.2)
Total Protein: 7.8 g/dL (ref 6.0–8.3)

## 2017-12-15 LAB — LIPID PANEL
CHOL/HDL RATIO: 4
Cholesterol: 196 mg/dL (ref 0–200)
HDL: 45 mg/dL (ref >39.00)
NonHDL: 151.31
Triglycerides: 218 mg/dL — ABNORMAL HIGH (ref 0.0–149.0)
VLDL: 43.6 mg/dL — AB (ref 0.0–40.0)

## 2017-12-15 LAB — MICROALBUMIN / CREATININE URINE RATIO
Creatinine,U: 89.9 mg/dL
MICROALB UR: 0.9 mg/dL (ref 0.0–1.9)
MICROALB/CREAT RATIO: 1 mg/g (ref 0.0–30.0)

## 2017-12-15 LAB — VITAMIN B12: Vitamin B-12: >1500 pg/mL — ABNORMAL HIGH (ref 211–911)

## 2017-12-15 LAB — HEMOGLOBIN A1C: HEMOGLOBIN A1C: 6.2 % (ref 4.6–6.5)

## 2017-12-15 LAB — T4, FREE: FREE T4: 0.63 ng/dL (ref 0.60–1.60)

## 2017-12-15 LAB — TSH: TSH: 1.5 u[IU]/mL (ref 0.35–4.50)

## 2017-12-15 MED ORDER — DESVENLAFAXINE SUCCINATE ER 50 MG PO TB24: 50.0000 mg | ORAL_TABLET | Freq: Every day | ORAL | 3 refills | Status: DC

## 2017-12-15 MED ORDER — GLUCOSE BLOOD VI STRP: ORAL_STRIP | 2 refills | Status: DC

## 2017-12-15 MED ORDER — LISINOPRIL-HYDROCHLOROTHIAZIDE 20-12.5 MG PO TABS: 2.0000 | ORAL_TABLET | Freq: Every day | ORAL | 3 refills | Status: DC

## 2017-12-15 MED ORDER — ONETOUCH DELICA LANCETS FINE MISC: 1 refills | Status: DC

## 2017-12-15 NOTE — Patient Instructions (Signed)

## 2017-12-15 NOTE — Progress Notes (Addendum)
Subjective:  I acted as a Neurosurgeonscribe for Lubrizol CorporationDr.Lowne-Chase  Toya, CMA   Patient ID: [name], female    DOB: 08/11/1966, 51 y.o.   MRN: [id number]  Chief Complaint  Patient presents with  . Follow-up  . Hypertension  . Hyperlipidemia    HPI  Patient is in today for a 6 month follow up.  She has mult c/o and is stressed about her in laws--- Father in law has memory problems and Mother in law has recently had 2 strokes.  She also c/o ringining in her ears -- Patient Care Team: Zola ButtonLowne Chase, Grayling CongressYvonne R, DO as PCP - General (Family Medicine) Rolm BaptiseWhitt, Mercer PodJulie S, LCSW as Social Worker (Licensed Visual merchandiserClinical Social Worker)   Past Medical History:  Diagnosis Date  . Abdominal pain   . Anemia   . Arthritis   . Bruises easily   . Depression   . Extremity numbness    legs, feet, arms, hands  . Foot fracture    bilateral  . Frequent falls   . Hair loss   . Hypotension   . Irritable bowel   . Kidney stones   . Leg pain    when driving long distances  . Memory loss   . Migraines   . Panic disorder   . Vision problems     Past Surgical History:  Procedure Laterality Date  . ABDOMINAL HYSTERECTOMY    . ABDOMINOPLASTY    . CHOLECYSTECTOMY    . FOOT SURGERY Left    Plantar Fasc  . LAPAROSCOPIC GASTRIC BANDING    . TONSILLECTOMY      Family History  Problem Relation Age of Onset  . Hypertension Mother   . Deep vein thrombosis Mother        Finger  . Panic disorder Mother   . Hypertension Father   . Heart attack Father        Deceased, 4450s  . Diabetes Maternal Grandmother   . Heart disease Maternal Grandmother        chf MI  . Heart disease Maternal Grandfather [id number]       MI    Social History   Socioeconomic History  . Marital status: Married    Spouse name: Not on file  . Number of children: Not on file  . Years of education: Not on file  . Highest education level: Not on file  Social Needs  . Financial resource strain: Not on file  . Food insecurity - worry: Not on  file  . Food insecurity - inability: Not on file  . Transportation needs - medical: Not on file  . Transportation needs - non-medical: Not on file  Occupational History  . Not on file  Tobacco Use  . Smoking status: Current Every Day Smoker    Packs/day: 1.00    Years: 30.00    Pack years: 30.00  . Smokeless tobacco: Never Used  Substance and Sexual Activity  . Alcohol use: No  . Drug use: No  . Sexual activity: Yes    Partners: Male  Other Topics Concern  . Not on file  Social History Narrative   She lives with husband.  They have one grown child.   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   Highest level of education:  Associates degree in early childhood    Outpatient Medications Prior to Visit  Medication Sig Dispense Refill  . albuterol (PROAIR HFA) 108 (90 Base) MCG/ACT inhaler Inhale 2 puffs into the lungs  every 6 (six) hours as needed for wheezing or shortness of breath. 1 Inhaler 3  . clonazePAM (KLONOPIN) 1 MG tablet Take 1 tablet (1 mg total) by mouth 3 (three) times daily as needed. 90 tablet 2  . cyanocobalamin (,VITAMIN B-12,) 1000 MCG/ML injection INJECT 1 ML (1,000 MCG TOTAL) INTO THE MUSCLE EVERY 30 (THIRTY) DAYS. 1 mL 5  . fluticasone (FLONASE) 50 MCG/ACT nasal spray Place 2 sprays into both nostrils daily. 16 g 1  . furosemide (LASIX) 20 MG tablet Take 1 tablet (20 mg total) by mouth daily. 30 tablet 3  . gabapentin (NEURONTIN) 100 MG capsule Take 2 capsules (200 mg total) 3 (three) times daily by mouth. 180 capsule 2  . lisdexamfetamine (VYVANSE) 70 MG capsule Take 1 capsule (70 mg total) daily by mouth. 30 capsule 0  . lisdexamfetamine (VYVANSE) 70 MG capsule Take 1 capsule (70 mg total) daily by mouth. 30 capsule 0  . lisdexamfetamine (VYVANSE) 70 MG capsule Take 1 capsule (70 mg total) daily by mouth. 30 capsule 0  . meclizine (ANTIVERT) 25 MG tablet Take 1 tablet (25 mg total) by mouth 3 (three) times daily as needed for dizziness. 30 tablet 0  .  metFORMIN (GLUCOPHAGE-XR) 500 MG 24 hr tablet TAKE 1 TABLET BY MOUTH IN THE MORNING AND TAKE 2 TABLETS IN THE EVENING 90 tablet 1  . SYRINGE-NEEDLE, DISP, 3 ML 25G X 1" 3 ML MISC 10 each by Does not apply route as directed. 10 each 2  . Vitamin D, Ergocalciferol, (DRISDOL) 50000 units CAPS capsule TAKE 1 CAPSULE (50,000 UNITS TOTAL) BY MOUTH EVERY 7 (SEVEN) DAYS. 4 capsule 2  . citalopram (CELEXA) 10 MG tablet TAKE 1 TABLET BY MOUTH EVERY DAY 30 tablet 2  . lisinopril-hydrochlorothiazide (PRINZIDE,ZESTORETIC) 10-12.5 MG tablet Take 1 tablet by mouth daily. 90 tablet 1  . ONE TOUCH ULTRA TEST test strip TEST 3 TIMES A DAY 50 each 1   No facility-administered medications prior to visit.     No Known Allergies  Review of Systems  Constitutional: Negative for chills, fever and malaise/fatigue.  HENT: Negative for congestion and hearing loss.   Eyes: Negative for discharge.  Respiratory: Negative for cough, sputum production and shortness of breath.   Cardiovascular: Negative for chest pain, palpitations and leg swelling.  Gastrointestinal: Negative for abdominal pain, blood in stool, constipation, diarrhea, heartburn, nausea and vomiting.  Genitourinary: Negative for dysuria, frequency, hematuria and urgency.  Musculoskeletal: Negative for back pain, falls and myalgias.  Skin: Negative for rash.  Neurological: Negative for dizziness, sensory change, loss of consciousness, weakness and headaches.  Endo/Heme/Allergies: Negative for environmental allergies. Does not bruise/bleed easily.  Psychiatric/Behavioral: Negative for depression and suicidal ideas. The patient is not nervous/anxious and does not have insomnia.        Objective:    Physical Exam  Constitutional: She is oriented to person, place, and time. She appears well-developed and well-nourished.  HENT:  Head: Normocephalic and atraumatic.  Eyes: Conjunctivae and EOM are normal.  Neck: Normal range of motion. Neck supple. No JVD  present. Carotid bruit is not present. No thyromegaly present.  Cardiovascular: Normal rate, regular rhythm and normal heart sounds.  No murmur heard. Pulmonary/Chest: Effort normal and breath sounds normal. No respiratory distress. She has no wheezes. She has no rales. She exhibits no tenderness.  Musculoskeletal: She exhibits no edema.  Neurological: She is alert and oriented to person, place, and time.  Psychiatric: She has a normal mood and affect.  Nursing  note and vitals reviewed.   BP (!) 190/120   Pulse 90   Temp 97.9 F (36.6 C) (Oral)   Ht 5\' 5"  (1.651 m)   Wt 221 lb (100.2 kg)   SpO2 97%   BMI 36.78 kg/m  Wt Readings from Last 3 Encounters:  12/15/17 221 lb (100.2 kg)  06/09/17 223 lb (101.2 kg)  03/13/17 226 lb 6.4 oz (102.7 kg)   BP Readings from Last 3 Encounters:  12/15/17 (!) 190/120  06/09/17 118/78  03/13/17 138/70     Immunization History  Administered Date(s) Administered  . Influenza,inj,Quad PF,6+ Mos 11/04/2013, 10/16/2014, 12/28/2015, 11/03/2017  . Pneumococcal Polysaccharide-23 12/28/2015  . Tdap 06/07/2011    Health Maintenance  Topic Date Due  . OPHTHALMOLOGY EXAM  05/01/1975  . FOOT EXAM  02/06/2015  . COLONOSCOPY  05/01/2015  . MAMMOGRAM  12/26/2016  . HEMOGLOBIN A1C  12/09/2017  . PAP SMEAR  12/27/2018  . PNEUMOCOCCAL POLYSACCHARIDE VACCINE (2) 12/27/2020  . TETANUS/TDAP  06/06/2021  . INFLUENZA VACCINE  Completed  . HIV Screening  Completed    Lab Results  Component Value Date   WBC 8.2 06/09/2017   HGB 16.3 (H) 06/09/2017   HCT 48.1 (H) 06/09/2017   PLT 286.0 06/09/2017   GLUCOSE 86 06/09/2017   CHOL 192 04/21/2016   TRIG 201.0 (H) 04/21/2016   HDL 42.30 04/21/2016   LDLDIRECT 123.0 04/21/2016   LDLCALC 111 (H) 12/28/2015   ALT 15 06/09/2017   AST 17 06/09/2017   NA 138 06/09/2017   K 4.6 06/09/2017   CL 105 06/09/2017   CREATININE 0.84 06/09/2017   BUN 8 06/09/2017   CO2 26 06/09/2017   TSH 1.02 06/09/2017   INR  0.97 06/23/2014   HGBA1C 6.1 06/09/2017   MICROALBUR 0.89 11/04/2013    Lab Results  Component Value Date   TSH 1.02 06/09/2017   Lab Results  Component Value Date   WBC 8.2 06/09/2017   HGB 16.3 (H) 06/09/2017   HCT 48.1 (H) 06/09/2017   MCV 93.2 06/09/2017   PLT 286.0 06/09/2017   Lab Results  Component Value Date   NA 138 06/09/2017   K 4.6 06/09/2017   CO2 26 06/09/2017   GLUCOSE 86 06/09/2017   BUN 8 06/09/2017   CREATININE 0.84 06/09/2017   BILITOT 0.6 06/09/2017   ALKPHOS 72 06/09/2017   AST 17 06/09/2017   ALT 15 06/09/2017   PROT 7.5 06/09/2017   ALBUMIN 4.2 06/09/2017   CALCIUM 10.1 06/09/2017   GFR 75.64 06/09/2017   Lab Results  Component Value Date   CHOL 192 04/21/2016   Lab Results  Component Value Date   HDL 42.30 04/21/2016   Lab Results  Component Value Date   LDLCALC 111 (H) 12/28/2015   Lab Results  Component Value Date   TRIG 201.0 (H) 04/21/2016   Lab Results  Component Value Date   CHOLHDL 5 04/21/2016   Lab Results  Component Value Date   HGBA1C 6.1 06/09/2017         Assessment & Plan:   Problem List Items Addressed This Visit      Unprioritized   Clinical depression   Relevant Medications   desvenlafaxine (PRISTIQ) 50 MG 24 hr tablet   Other Relevant Orders   Ambulatory referral to Psychology   DM (diabetes mellitus) type II uncontrolled, periph vascular disorder (HCC)    hgba1c to be checked, minimize simple carbs. Increase exercise as tolerated. Continue current meds  Relevant Medications   lisinopril-hydrochlorothiazide (ZESTORETIC) 20-12.5 MG tablet   Other Relevant Orders   Lipid panel   Hemoglobin A1c   Comprehensive metabolic panel   Microalbumin / creatinine urine ratio   POCT Urinalysis Dipstick (Automated) (Completed)   HTN (hypertension)    Well controlled, no changes to meds. Encouraged heart healthy diet such as the DASH diet and exercise as tolerated.       Relevant Medications    lisinopril-hydrochlorothiazide (ZESTORETIC) 20-12.5 MG tablet   Other Relevant Orders   Lipid panel   Hemoglobin A1c   Comprehensive metabolic panel   Microalbumin / creatinine urine ratio   POCT Urinalysis Dipstick (Automated) (Completed)   Hyperlipidemia associated with type 2 diabetes mellitus (HCC) - Primary    Tolerating statin, encouraged heart healthy diet, avoid trans fats, minimize simple carbs and saturated fats. Increase exercise as tolerated      Relevant Medications   lisinopril-hydrochlorothiazide (ZESTORETIC) 20-12.5 MG tablet   Other Relevant Orders   Lipid panel   Hemoglobin A1c   Comprehensive metabolic panel   Microalbumin / creatinine urine ratio   POCT Urinalysis Dipstick (Automated) (Completed)   Hypothyroidism    Check labs      Relevant Orders   TSH   T3, free   T4, free   Peripheral neuropathy   Relevant Medications   desvenlafaxine (PRISTIQ) 50 MG 24 hr tablet   Other Relevant Orders   Vitamin B12      I have discontinued [employer] lisinopril-hydrochlorothiazide, citalopram, and ONE TOUCH ULTRA TEST. I am also having her start on lisinopril-hydrochlorothiazide, desvenlafaxine, glucose blood, and ONETOUCH DELICA LANCETS FINE. Additionally, I am having her maintain her furosemide, albuterol, meclizine, Vitamin D (Ergocalciferol), fluticasone, metFORMIN, cyanocobalamin, lisdexamfetamine, lisdexamfetamine, lisdexamfetamine, gabapentin, clonazePAM, and SYRINGE-NEEDLE (DISP) 3 ML.  Meds ordered this encounter  Medications  . lisinopril-hydrochlorothiazide (ZESTORETIC) 20-12.5 MG tablet    Sig: Take 2 tablets by mouth daily.    Dispense:  180 tablet    Refill:  3  . desvenlafaxine (PRISTIQ) 50 MG 24 hr tablet    Sig: Take 1 tablet (50 mg total) by mouth daily.    Dispense:  30 tablet    Refill:  3  . glucose blood (ONETOUCH VERIO) test strip    Sig: Use as directed once a day.  DX: E11.51    Dispense:  50 each    Refill:  2  . ONETOUCH  DELICA LANCETS FINE MISC    Sig: Use as directed once a day.  DX: E11.51    Dispense:  100 each    Refill:  1    CMA served as scribe during this visit. History, Physical and Plan performed by medical provider. Documentation and orders reviewed and attested to.  Donato SchultzYvonne R Lowne Chase, DO

## 2017-12-15 NOTE — Assessment & Plan Note (Signed)
Check labs 

## 2017-12-15 NOTE — Assessment & Plan Note (Signed)
Tolerating statin, encouraged heart healthy diet, avoid trans fats, minimize simple carbs and saturated fats. Increase exercise as tolerated 

## 2017-12-15 NOTE — Assessment & Plan Note (Signed)
Well controlled, no changes to meds. Encouraged heart healthy diet such as the DASH diet and exercise as tolerated.  °

## 2017-12-15 NOTE — Assessment & Plan Note (Signed)
hgba1c to be checked, minimize simple carbs. Increase exercise as tolerated. Continue current meds  

## 2017-12-31 ENCOUNTER — Other Ambulatory Visit: Payer: Self-pay | Admitting: Family Medicine

## 2017-12-31 DIAGNOSIS — F418 Other specified anxiety disorders: Secondary | ICD-10-CM

## 2018-02-09 ENCOUNTER — Other Ambulatory Visit: Payer: Self-pay | Admitting: Family Medicine

## 2018-02-09 DIAGNOSIS — F909 Attention-deficit hyperactivity disorder, unspecified type: Secondary | ICD-10-CM

## 2018-02-09 MED ORDER — LISDEXAMFETAMINE DIMESYLATE 70 MG PO CAPS: 70.0000 mg | ORAL_CAPSULE | Freq: Every day | ORAL | 0 refills | Status: DC

## 2018-02-09 NOTE — Telephone Encounter (Signed)
Requesting: vyvanse Contract:06/09/17 UDS:06/11/17 low risk Last Visit: 12/15/17 Next Visit: none Last Refill: was given 3 prescriptions on 11/04/17   Please Advise

## 2018-02-11 ENCOUNTER — Other Ambulatory Visit: Payer: Self-pay | Admitting: Family Medicine

## 2018-02-11 DIAGNOSIS — F909 Attention-deficit hyperactivity disorder, unspecified type: Secondary | ICD-10-CM

## 2018-02-20 ENCOUNTER — Other Ambulatory Visit: Payer: Self-pay | Admitting: Family Medicine

## 2018-02-21 ENCOUNTER — Other Ambulatory Visit: Payer: Self-pay | Admitting: Family Medicine

## 2018-02-21 DIAGNOSIS — F411 Generalized anxiety disorder: Secondary | ICD-10-CM

## 2018-02-22 ENCOUNTER — Other Ambulatory Visit: Payer: Self-pay

## 2018-02-22 DIAGNOSIS — F411 Generalized anxiety disorder: Secondary | ICD-10-CM

## 2018-02-22 MED ORDER — CLONAZEPAM 1 MG PO TABS: 1.0000 mg | ORAL_TABLET | Freq: Three times a day (TID) | ORAL | 2 refills | Status: DC | PRN

## 2018-02-22 NOTE — Telephone Encounter (Signed)
Requesting: KLONOPIN 1 MG Contract:06/09/17 UDS: 06/10/17 Last OV: 12/15/17 Next OV: - Last Refill: 11/26/17 #90 2 rf  Please advise

## 2018-02-27 ENCOUNTER — Other Ambulatory Visit: Payer: Self-pay | Admitting: Family Medicine

## 2018-03-18 ENCOUNTER — Other Ambulatory Visit: Payer: Self-pay | Admitting: Family Medicine

## 2018-03-18 DIAGNOSIS — G894 Chronic pain syndrome: Secondary | ICD-10-CM

## 2018-03-21 ENCOUNTER — Other Ambulatory Visit: Payer: Self-pay | Admitting: Family Medicine

## 2018-03-23 NOTE — Telephone Encounter (Signed)
Rx approved and sent to the pharmacy by e-script.//AB/CMA 

## 2018-03-30 ENCOUNTER — Other Ambulatory Visit: Payer: Self-pay | Admitting: Family Medicine

## 2018-03-30 DIAGNOSIS — F418 Other specified anxiety disorders: Secondary | ICD-10-CM

## 2018-04-06 ENCOUNTER — Encounter: Payer: Self-pay | Admitting: Podiatry

## 2018-04-06 ENCOUNTER — Other Ambulatory Visit: Payer: Self-pay | Admitting: Podiatry

## 2018-04-06 ENCOUNTER — Ambulatory Visit: Payer: 59 | Admitting: Podiatry

## 2018-04-06 ENCOUNTER — Ambulatory Visit (INDEPENDENT_AMBULATORY_CARE_PROVIDER_SITE_OTHER): Payer: 59

## 2018-04-06 VITALS — BP 171/110 | HR 84

## 2018-04-06 DIAGNOSIS — E1142 Type 2 diabetes mellitus with diabetic polyneuropathy: Secondary | ICD-10-CM

## 2018-04-06 DIAGNOSIS — M722 Plantar fascial fibromatosis: Secondary | ICD-10-CM

## 2018-04-06 DIAGNOSIS — R52 Pain, unspecified: Secondary | ICD-10-CM

## 2018-04-06 DIAGNOSIS — E1151 Type 2 diabetes mellitus with diabetic peripheral angiopathy without gangrene: Secondary | ICD-10-CM

## 2018-04-06 NOTE — Progress Notes (Signed)
This patient presents to my office for an evaluation of her painful legs and feet.  She says that the pain has been severe, which has caused her to give up her job and frequently limits her activities.  This patient gives a history of having  [facility].  She also admits to having had surgery on her left heel.  She is also been to a chiropractor who has determined she has multiple problems in her legs and back.  She says that she was treated with orthoses. .  She also says that she fell and broke both of her feet according to her doctor. She says the doctor never took x-rays to confirm his diagnosis. She has a very long history of medical problems treated by many different doctors.  She presents to my office today for a second opinion and my evaluation of her severe foot and leg pain. Patient is diabetic.  General Appearance  Alert, conversant and in no acute stress.  Vascular  Dorsalis pedis and posterior tibial  pulses are palpable  bilaterally.  Capillary return is about 5 seconds.  bilaterally. Temperature is diminished  Bilaterally. Purplish discoloration feet  B/L.   Neurologic  Senn-Weinstein monofilament wire test within normal limits  bilaterally. Muscle power within normal limits bilaterally.  Nails Normal nails noted with no evidence of fungal or bacterial infections.  Orthopedic  No limitations of motion of motion feet .  No crepitus or effusions noted.  Functional hallux limitus noted.  Skin  normotropic skin with no porokeratosis noted bilaterally.  No signs of infections or ulcers noted.     Diabetes with vascular disease.  IE  Xrays reveal no evidence of bony or arthritic deformity.  During our office visit , she mentioned symptoms related to vascular she said she had numbness and tingling. She says she has pain with activity and  severe pain at rest while she was sleeping/  She admits that she had multiple tests to determine the presence of neuropathy  in both extremities.  She also says that she has not had any vascular studies performed.  Therefore, I proceeded to schedule vascular studies to evaluate the circulatory status of this patient.  Based on our conversation, I cannot rule out a functional hallux limitus problem as well as a possible nerve pathology.  I will start with the vascular studies and work from there.   Helane GuntherGregory Reeya Bound DPM

## 2018-04-17 ENCOUNTER — Other Ambulatory Visit: Payer: Self-pay | Admitting: Family Medicine

## 2018-04-17 DIAGNOSIS — F332 Major depressive disorder, recurrent severe without psychotic features: Secondary | ICD-10-CM

## 2018-04-20 ENCOUNTER — Other Ambulatory Visit: Payer: Self-pay | Admitting: Podiatry

## 2018-04-20 DIAGNOSIS — R52 Pain, unspecified: Secondary | ICD-10-CM

## 2018-04-20 DIAGNOSIS — I739 Peripheral vascular disease, unspecified: Secondary | ICD-10-CM

## 2018-04-22 ENCOUNTER — Ambulatory Visit (HOSPITAL_COMMUNITY): Admission: RE | Admit: 2018-04-22 | Payer: 59 | Source: Ambulatory Visit | Attending: Podiatry | Admitting: Podiatry

## 2018-04-27 ENCOUNTER — Ambulatory Visit (HOSPITAL_COMMUNITY)
Admission: RE | Admit: 2018-04-27 | Discharge: 2018-04-27 | Disposition: A | Discharge status: Home / Self Care | Payer: 59 | Source: Ambulatory Visit | Attending: Internal Medicine | Admitting: Internal Medicine

## 2018-04-27 DIAGNOSIS — E119 Type 2 diabetes mellitus without complications: Secondary | ICD-10-CM | POA: Insufficient documentation

## 2018-04-27 DIAGNOSIS — E785 Hyperlipidemia, unspecified: Secondary | ICD-10-CM | POA: Diagnosis not present

## 2018-04-27 DIAGNOSIS — I10 Essential (primary) hypertension: Secondary | ICD-10-CM | POA: Diagnosis not present

## 2018-04-27 DIAGNOSIS — R52 Pain, unspecified: Secondary | ICD-10-CM | POA: Insufficient documentation

## 2018-04-27 DIAGNOSIS — I739 Peripheral vascular disease, unspecified: Secondary | ICD-10-CM | POA: Diagnosis not present

## 2018-05-03 ENCOUNTER — Other Ambulatory Visit: Payer: Self-pay | Admitting: Family Medicine

## 2018-05-05 ENCOUNTER — Encounter: Payer: Self-pay | Admitting: Podiatry

## 2018-05-10 ENCOUNTER — Other Ambulatory Visit: Payer: Self-pay | Admitting: Family Medicine

## 2018-05-10 ENCOUNTER — Encounter: Payer: Self-pay | Admitting: Family Medicine

## 2018-05-10 DIAGNOSIS — F909 Attention-deficit hyperactivity disorder, unspecified type: Secondary | ICD-10-CM

## 2018-05-10 MED ORDER — LISDEXAMFETAMINE DIMESYLATE 70 MG PO CAPS: 70.0000 mg | ORAL_CAPSULE | Freq: Every day | ORAL | 0 refills | Status: DC

## 2018-05-10 NOTE — Telephone Encounter (Signed)
Copied from CRM [id number]. Topic: Inquiry >> May 10, 2018  3:44 PM Terisa Starr wrote: Reason for CRM: Patient wants Dr Zola Button to know that she went to the [facility]. Her feet look worse. She said they sent [facility] to [facility]. She had a vascular test done on her feet.

## 2018-05-10 NOTE — Telephone Encounter (Signed)
I sent in vyvanse---   Did they say anything about the vasular test?

## 2018-05-10 NOTE — Telephone Encounter (Signed)
Copied from CRM [id number]. Topic: Quick Communication - See Telephone Encounter >> May 10, 2018  3:42 PM Terisa Starr wrote: CRM for notification. See Telephone encounter for: 05/10/18.  lisdexamfetamine (VYVANSE) 70 MG capsule gabapentin (NEURONTIN) 100 MG capsule  Contacted pharmacy. [id number] - [place], [place] - [address]

## 2018-05-11 ENCOUNTER — Other Ambulatory Visit: Payer: Self-pay | Admitting: *Deleted

## 2018-05-11 DIAGNOSIS — G894 Chronic pain syndrome: Secondary | ICD-10-CM

## 2018-05-11 MED ORDER — GABAPENTIN 300 MG PO CAPS: 300.0000 mg | ORAL_CAPSULE | Freq: Three times a day (TID) | ORAL | 1 refills | Status: DC

## 2018-05-11 NOTE — Telephone Encounter (Signed)
Pt has been notified through email she states everything was normal but however both feet and kneecaps still hurt she states nothing seems to be working for her at all and wants to know what she should do next?

## 2018-05-11 NOTE — Telephone Encounter (Signed)
Look like the vascular test was normal.  Please advise.

## 2018-05-11 NOTE — Telephone Encounter (Signed)
She is taking 200 mg tid ---  We can increase to 300 mg tid and we can go up from there if needed  Another option is to switch to lyrica but it does cost more

## 2018-05-11 NOTE — Telephone Encounter (Signed)
Looks like podiatrist had a plan of action based on his last note.  Pt has not been seen by me in almost a year --- I would recommend f/u here after she hears back from podiatry We may consider referral back to pain management or referral to specialist at [facility] but I would want to see her first

## 2018-05-12 NOTE — Telephone Encounter (Signed)
MyChart message sent for pt. to set up an appointment

## 2018-05-19 ENCOUNTER — Telehealth: Payer: Self-pay | Admitting: *Deleted

## 2018-05-19 ENCOUNTER — Encounter: Payer: Self-pay | Admitting: Neurology

## 2018-05-19 DIAGNOSIS — E1142 Type 2 diabetes mellitus with diabetic polyneuropathy: Secondary | ICD-10-CM

## 2018-05-19 DIAGNOSIS — R52 Pain, unspecified: Secondary | ICD-10-CM

## 2018-05-19 DIAGNOSIS — E1151 Type 2 diabetes mellitus with diabetic peripheral angiopathy without gangrene: Secondary | ICD-10-CM

## 2018-05-19 NOTE — Telephone Encounter (Signed)
Faxed order, referral, clinicals and demographics to [facility].

## 2018-05-19 NOTE — Telephone Encounter (Signed)
Dr. Stacie Acres states pt needs to go back to the neurologist for repeat NCV with EMG, evaluation and possible muscle biopsy. Left message requesting pt call for Dr. Stacie Acres recommendations.

## 2018-05-19 NOTE — Telephone Encounter (Signed)
I informed pt of Dr. Aram Beecham orders and that I would order NCV with EMG and referral to [facility].

## 2018-05-30 ENCOUNTER — Other Ambulatory Visit: Payer: Self-pay | Admitting: Family Medicine

## 2018-05-30 DIAGNOSIS — E538 Deficiency of other specified B group vitamins: Secondary | ICD-10-CM

## 2018-06-06 ENCOUNTER — Other Ambulatory Visit: Payer: Self-pay | Admitting: Family Medicine

## 2018-06-25 ENCOUNTER — Other Ambulatory Visit: Payer: Self-pay | Admitting: Family Medicine

## 2018-06-25 DIAGNOSIS — F411 Generalized anxiety disorder: Secondary | ICD-10-CM

## 2018-06-28 ENCOUNTER — Telehealth: Payer: Self-pay | Admitting: Family Medicine

## 2018-06-28 ENCOUNTER — Other Ambulatory Visit: Payer: Self-pay | Admitting: Family Medicine

## 2018-06-28 NOTE — Telephone Encounter (Signed)
Requesting: clonazepam 1mg  tid prn Contract: 06/09/17 UDS:06/09/17 Last OV: 12/15/17 Next Ov: N/A Last refill: 02/22/18, #90, 2RF Database: no discrepancies found.  Please advise.

## 2018-06-28 NOTE — Telephone Encounter (Unsigned)
Copied from CRM [id number]. Topic: Quick Communication - Rx Refill/Question >> Jun 28, 2018  4:50 PM Mcneil, Ja-Kwan wrote: Medication: clonazePAM (KLONOPIN) 1 MG tablet  Has the patient contacted their pharmacy? yes   Preferred Pharmacy (with phone number or street name): [id number] [place], [place] - [address] [phone] (Phone) [phone] (Fax)   Agent: Please be advised that RX refills may take up to 3 business days. We ask that you follow-up with your pharmacy.

## 2018-06-29 NOTE — Telephone Encounter (Signed)
Left VM re: refill request. Called pharmacy, rx is ready.

## 2018-06-30 ENCOUNTER — Other Ambulatory Visit: Payer: Self-pay | Admitting: Family

## 2018-06-30 DIAGNOSIS — F411 Generalized anxiety disorder: Secondary | ICD-10-CM

## 2018-06-30 NOTE — Telephone Encounter (Signed)
Received additional request from pharmacy that pt states she normally gets #90 clonazepam for 30 days and is questioning the 30 tablets for 30 days. Per verbal from covering Provider, #30 tablets is all she is comfortable prescribing in PCP's absence.  Mychart message sent to pt to explain short term supply until PCP is back then can request further quantity.

## 2018-07-04 ENCOUNTER — Other Ambulatory Visit: Payer: Self-pay | Admitting: Family Medicine

## 2018-07-04 DIAGNOSIS — F418 Other specified anxiety disorders: Secondary | ICD-10-CM

## 2018-07-05 ENCOUNTER — Other Ambulatory Visit: Payer: Self-pay | Admitting: Family Medicine

## 2018-07-05 DIAGNOSIS — F418 Other specified anxiety disorders: Secondary | ICD-10-CM

## 2018-07-11 ENCOUNTER — Other Ambulatory Visit: Payer: Self-pay | Admitting: Family Medicine

## 2018-07-11 DIAGNOSIS — F411 Generalized anxiety disorder: Secondary | ICD-10-CM

## 2018-07-12 ENCOUNTER — Other Ambulatory Visit: Payer: Self-pay | Admitting: Family Medicine

## 2018-07-12 ENCOUNTER — Telehealth: Payer: Self-pay | Admitting: *Deleted

## 2018-07-12 DIAGNOSIS — F411 Generalized anxiety disorder: Secondary | ICD-10-CM

## 2018-07-12 MED ORDER — CLONAZEPAM 1 MG PO TABS: 1.0000 mg | ORAL_TABLET | Freq: Three times a day (TID) | ORAL | 0 refills | Status: DC | PRN

## 2018-07-12 NOTE — Telephone Encounter (Signed)
Database ran and is on your desk for review.  Last filled per database:  Last written:06/28/18 Last ov: 12/15/17 Next ov: none Contract: none UDS: none  Melissa only filled 30 tabs on 06/28/18

## 2018-07-12 NOTE — Telephone Encounter (Signed)
Copied from CRM [phone] [phone]. Topic: Quick Communication - Rx Refill/Question >> Jun 28, 2018  4:50 PM Mcneil, Ja-Kwan wrote: Medication: clonazePAM (KLONOPIN) 1 MG tablet  Has the patient contacted their pharmacy? yes   Preferred Pharmacy (with phone number or street name): [id number] [place], [place] - [address] [phone] (Phone) [phone])   Agent: Please be advised that RX refills may take up to 3 business days. We ask that you follow-up with your pharmacy. >> Jul 12, 2018  2:55 PM Floria RavelingStovall, Shana A wrote: Pt called in and stated that there was suppose to have some additional  [facility]  She stated on 7/1 there was some called in but she stated it was only called in for 1 daily but it should be per pt 3 per day with was 90 tabs   Best number - [phone]

## 2018-07-14 ENCOUNTER — Telehealth: Payer: Self-pay | Admitting: *Deleted

## 2018-07-14 NOTE — Telephone Encounter (Signed)
Duplicate message. See message from 07/12/18   Copied from CRM [id number]. Topic: Quick Communication - Rx Refill/Question >> Jun 28, 2018  4:50 PM Mcneil, Ja-Kwan wrote: Medication: clonazePAM (KLONOPIN) 1 MG tablet  Has the patient contacted their pharmacy? yes   Preferred Pharmacy (with phone number or street name): [id number] - [place], [place] - [address] [phone] (Phone) [phone])   Agent: Please be advised that RX refills may take up to 3 business days. We ask that you follow-up with your pharmacy. >> Jul 12, 2018  2:55 PM Floria RavelingStovall, Shana A wrote: Pt called in and stated that there was suppose to have some additional  [facility]  She stated on 7/1 there was some called in but she stated it was only called in for 1 daily but it should be per pt 3 per day with was 90 tabs   Best number - [phone]

## 2018-07-19 ENCOUNTER — Ambulatory Visit: Payer: 59 | Admitting: Neurology

## 2018-07-19 ENCOUNTER — Encounter

## 2018-07-19 ENCOUNTER — Encounter: Payer: Self-pay | Admitting: Neurology

## 2018-07-19 VITALS — BP 140/90 | HR 88 | Ht 65.0 in | Wt 231.0 lb

## 2018-07-19 DIAGNOSIS — R202 Paresthesia of skin: Secondary | ICD-10-CM | POA: Diagnosis not present

## 2018-07-19 DIAGNOSIS — M79672 Pain in left foot: Secondary | ICD-10-CM | POA: Diagnosis not present

## 2018-07-19 DIAGNOSIS — R200 Anesthesia of skin: Secondary | ICD-10-CM

## 2018-07-19 DIAGNOSIS — M79671 Pain in right foot: Secondary | ICD-10-CM

## 2018-07-19 NOTE — Addendum Note (Signed)
Addended by: Vivien RotaRIVER, Analeia Ismael H on: 1/61/09607/22/2019 03:09 PM   Modules accepted: Orders

## 2018-07-19 NOTE — Patient Instructions (Addendum)
NCS/EMG of the legs  ELECTROMYOGRAM AND NERVE CONDUCTION STUDIES (EMG/NCS) INSTRUCTIONS  How to Prepare The neurologist conducting the EMG will need to know if you have certain medical conditions. Tell the neurologist and other EMG lab personnel if you: . Have a pacemaker or any other electrical medical device . Take blood-thinning medications . Have hemophilia, a blood-clotting disorder that causes prolonged bleeding Bathing Take a shower or bath shortly before your exam in order to remove oils from your skin. Don't apply lotions or creams before the exam.  What to Expect You'll likely be asked to change into a hospital gown for the procedure and lie down on an examination table. The following explanations can help you understand what will happen during the exam.  . Electrodes. The neurologist or a technician places surface electrodes at various locations on your skin depending on where you're experiencing symptoms. Or the neurologist may insert needle electrodes at different sites depending on your symptoms.  . Sensations. The electrodes will at times transmit a tiny electrical current that you may feel as a twinge or spasm. The needle electrode may cause discomfort or pain that usually ends shortly after the needle is removed. If you are concerned about discomfort or pain, you may want to talk to the neurologist about taking a short break during the exam.  . Instructions. During the needle EMG, the neurologist will assess whether there is any spontaneous electrical activity when the muscle is at rest - activity that isn't present in healthy muscle tissue - and the degree of activity when you slightly contract the muscle.  He or she will give you instructions on resting and contracting a muscle at appropriate times. Depending on what muscles and nerves the neurologist is examining, he or she may ask you to change positions during the exam.  After your EMG You may experience some temporary, minor  bruising where the needle electrode was inserted into your muscle. This bruising should fade within several days. If it persists, contact your primary care doctor.    

## 2018-07-19 NOTE — Progress Notes (Signed)
[facility] Neurology Division Clinic Note - Initial Visit   Date: 07/19/18  [name] MRN: [id number] DOB: 26-May-1966   Dear Dr. Stacie Acres:  Thank you for your kind referral of [name] for consultation of bilateral feet pain. Although her history is well known to you, please allow [place] to reiterate it for the purpose of our medical record. The patient was accompanied to the clinic by mother who also provides collateral information.     History of Present Illness: [name] is a 52 y.o. right-handed Caucasian female with hypertension, hyperlipidemia, diabetes mellitus (HbA1c 6.2), tobacco use, panic attack, and depression/anxiety presenting for evaluation of bilateral feet pain.    She was previously evaluated by me in 2015 for whole body paresthesias at which time her neurological exam showed nonphysiological findings and her pain was suggestive of somatization.  She did endorse significant psychosocial stressors and anxiety and established care with psychiatry.  After counseling and addressing her panic attacks, these spells improved.  Prior to seeing me in 2013 - 2014, she saw Dr. Orie Rout, neurology, at [facility] for the same symptoms.  She had imaging of the cervical spine, electrodiagnostic testing, and serology testing which was all normal.    Most recently in April 2019, she saw her podiatrist for bilateral feet pain who ordered arterial studies, which were normal.  Due to ongoing pain, she has been referred for re-evaluation.  Soon after her last visit in 2015, she starting having episodic spells of shooting pain involving the feet and lower legs. Pain is worse in her feet, particularly worse in the heels.  She tells me that she has bone spurs which is causing her pain.  Heel pain is always worse with weight bearing.  In addition to heel pain, she has episodic shooting pain over the anterior leg and lower legs, occuring 1-3 times per week. She has some  relief with cold sensation and ice.  She walks unassisted and has not suffered falls.  She takes gabapentin 300mg  three times daily, which eases some of her discomfort only very slightly.  She also tells me that others have diagnosed her with CRPS.  For her pain, she has tried gabapentin, Cymbalta, topiramate, and Lyrica.    Out-side paper records, electronic medical record, and images have been reviewed where available and summarized as:  Lab Results  Component Value Date   TSH 1.50 12/15/2017   Lab Results  Component Value Date   VITAMINB12 >1500 (H) 12/15/2017   Lab Results  Component Value Date   HGBA1C 6.2 12/15/2017    Past Medical History:  Diagnosis Date  . Abdominal pain   . Anemia   . Arthritis   . Bruises easily   . Depression   . Extremity numbness    legs, feet, arms, hands  . Foot fracture    bilateral  . Frequent falls   . Hair loss   . Hypotension   . Irritable bowel   . Kidney stones   . Leg pain    when driving long distances  . Memory loss   . Migraines   . Panic disorder   . Vision problems     Past Surgical History:  Procedure Laterality Date  . ABDOMINAL HYSTERECTOMY    . ABDOMINOPLASTY    . CHOLECYSTECTOMY    . FOOT SURGERY Left    Plantar Fasc  . LAPAROSCOPIC GASTRIC BANDING    . TONSILLECTOMY       Medications:  Outpatient Encounter Medications as of 07/19/2018  Medication Sig  . albuterol (PROAIR HFA) 108 (90 Base) MCG/ACT inhaler Inhale 2 puffs into the lungs every 6 (six) hours as needed for wheezing or shortness of breath.  . citalopram (CELEXA) 10 MG tablet TAKE 1 TABLET BY MOUTH EVERY DAY  . citalopram (CELEXA) 10 MG tablet TAKE 1 TABLET BY MOUTH EVERY DAY  . clonazePAM (KLONOPIN) 1 MG tablet Take 1 tablet (1 mg total) by mouth 3 (three) times daily as needed.  . cyanocobalamin (,VITAMIN B-12,) 1000 MCG/ML injection INJECT 1 ML (1,000 MCG TOTAL) INTO THE MUSCLE EVERY 30 (THIRTY) DAYS.  Marland Kitchen desvenlafaxine (PRISTIQ) 50 MG 24 hr  tablet TAKE 1 TABLET BY MOUTH EVERY DAY  . fluticasone (FLONASE) 50 MCG/ACT nasal spray Place 2 sprays into both nostrils daily.  . furosemide (LASIX) 20 MG tablet Take 1 tablet (20 mg total) by mouth daily.  Marland Kitchen gabapentin (NEURONTIN) 300 MG capsule TAKE 1 CAPSULE BY MOUTH THREE TIMES A DAY  . lisdexamfetamine (VYVANSE) 70 MG capsule Take 1 capsule (70 mg total) by mouth daily.  Marland Kitchen lisdexamfetamine (VYVANSE) 70 MG capsule Take 1 capsule (70 mg total) by mouth daily.  Marland Kitchen lisdexamfetamine (VYVANSE) 70 MG capsule Take 1 capsule (70 mg total) by mouth daily.  Marland Kitchen lisinopril-hydrochlorothiazide (ZESTORETIC) 20-12.5 MG tablet Take 2 tablets by mouth daily.  . meclizine (ANTIVERT) 25 MG tablet Take 1 tablet (25 mg total) by mouth 3 (three) times daily as needed for dizziness.  . metFORMIN (GLUCOPHAGE-XR) 500 MG 24 hr tablet TAKE 1 TABLET BY MOUTH IN THE MORNING AND TAKE 2 TABLETS IN THE EVENING  . ONETOUCH DELICA LANCETS FINE MISC USE AS DIRECTED ONCE A DAY. DX: E11.51 *INSURANCE ONLY COVERS 30 DAY SUPPLY  . ONETOUCH VERIO test strip USE AS DIRECTED ONCE A DAY. DX: E11.51  . SYRINGE-NEEDLE, DISP, 3 ML 25G X 1" 3 ML MISC 10 each by Does not apply route as directed.  . Vitamin D, Ergocalciferol, (DRISDOL) 50000 units CAPS capsule TAKE 1 CAPSULE (50,000 UNITS TOTAL) BY MOUTH EVERY 7 (SEVEN) DAYS.   No facility-administered encounter medications on file as of 07/19/2018.      Allergies: No Known Allergies  Family History: Family History  Problem Relation Age of Onset  . Hypertension Mother   . Deep vein thrombosis Mother        Finger  . Panic disorder Mother   . Hypertension Father   . Heart attack Father        Deceased, 51s  . Diabetes Maternal Grandmother   . Heart disease Maternal Grandmother        chf MI  . Heart disease Maternal Grandfather 55       MI    Social History: Social History   Tobacco Use  . Smoking status: Current Every Day Smoker    Packs/day: 1.00    Years: 30.00     Pack years: 30.00  . Smokeless tobacco: Never Used  Substance Use Topics  . Alcohol use: No  . Drug use: No   Social History   Social History Narrative   She lives with husband.  They have one grown child.   Highest level of education:  Associates degree in early childhood    Review of Systems:  CONSTITUTIONAL: No fevers, chills, night sweats, or weight loss.   EYES: No visual changes or eye pain ENT: No hearing changes.  No history of nose bleeds.   RESPIRATORY: No cough, wheezing and shortness of  breath.   CARDIOVASCULAR: Negative for chest pain, and palpitations.   GI: Negative for abdominal discomfort, blood in stools or black stools.  No recent change in bowel habits.   GU:  No history of incontinence.   MUSCLOSKELETAL: +history of joint pain or swelling.  +myalgias.   SKIN: Negative for lesions, rash, and itching.   HEMATOLOGY/ONCOLOGY: Negative for prolonged bleeding, bruising easily, and swollen nodes.  No history of cancer.   ENDOCRINE: Negative for cold or heat intolerance, polydipsia or goiter.   PSYCH:  +depression +anxiety symptoms.   NEURO: As Above.   Vital Signs:  BP 140/90   Pulse 88   Ht 5\' 5"  (1.651 m)   Wt 231 lb (104.8 kg)   SpO2 98%   BMI 38.44 kg/m    General Medical Exam:   General:  Well appearing, comfortable.   Eyes/ENT: see cranial nerve examination.   Neck: No masses appreciated.  Full range of motion without tenderness.  No carotid bruits. Respiratory:  Clear to auscultation, good air entry bilaterally.   Cardiac:  Regular rate and rhythm, no murmur.   Extremities:  No deformities, edema  Skin:  Mild erythema of the feet bilaterally.   Neurological Exam: MENTAL STATUS including orientation to time, place, person, recent and remote memory, attention span and concentration, language, and fund of knowledge is normal.  Speech is not dysarthric.  CRANIAL NERVES: II:  No visual field defects.  Unremarkable fundi.   III-IV-VI: Pupils equal  round and reactive to light.  Normal conjugate, extra-ocular eye movements in all directions of gaze.  No nystagmus.  No ptosis.   V:  Normal facial sensation.    VII:  Normal facial symmetry and movements.   VIII:  Normal hearing and vestibular function.   IX-X:  Normal palatal movement.   XI:  Normal shoulder shrug and head rotation.   XII:  Normal tongue strength and range of motion, no deviation or fasciculation.  MOTOR:  No atrophy, fasciculations or abnormal movements.  No pronator drift.  Tone is normal.    Right Upper Extremity:    Left Upper Extremity:    Deltoid  5/5   Deltoid  5/5   Biceps  5/5   Biceps  5/5   Triceps  5/5   Triceps  5/5   Wrist extensors  5/5   Wrist extensors  5/5   Wrist flexors  5/5   Wrist flexors  5/5   Finger extensors  5/5   Finger extensors  5/5   Finger flexors  5/5   Finger flexors  5/5   Dorsal interossei  5/5   Dorsal interossei  5/5   Abductor pollicis  5/5   Abductor pollicis  5/5   Tone (Ashworth scale)  0  Tone (Ashworth scale)  0   Right Lower Extremity:    Left Lower Extremity:    Hip flexors  5/5   Hip flexors  5/5   Hip extensors  5/5   Hip extensors  5/5   Knee flexors  5/5   Knee flexors  5/5   Knee extensors  5/5   Knee extensors  5/5   Dorsiflexors  5/5   Dorsiflexors  5/5   Plantarflexors  5/5   Plantarflexors  5/5   Toe extensors  5/5   Toe extensors  5/5   Toe flexors  5/5   Toe flexors  5/5   Tone (Ashworth scale)  0  Tone (Ashworth scale)  0   MSRs:  Right                                                                 Left brachioradialis 2+  brachioradialis 2+  biceps 2+  biceps 2+  triceps 2+  triceps 2+  patellar 2+  patellar 2+  ankle jerk 2+  ankle jerk 2+  Hoffman no  Hoffman no  plantar response down  plantar response down   SENSORY:  Vibration is mildly diminished at the great toe, worse on the left; otherwise sensation to temperature, light touch, and pin prick is intact.  Romberg's sign absent.    COORDINATION/GAIT: Normal finger-to- nose-finger.  Intact rapid alternating movements bilaterally.  Gait appears antalgic and wide-based, unassisted.  She is able to walk on toe, unable to perform heel walking due to pain.   IMPRESSION: Ms. Claudell KyleHuddy is a 52 year-old female referred for evaluation of chronic bilateral feet > leg pain.  Her pain is diffuse and although worse in the feet, also involves her lower legs and thigh which is much greater than what is typically seen in neuropathy. Her TSH and vitamin B12 is normal.  Diabetes is also very well controlled with HbA1c borderline at 6.2.   She denies radicular pain and the normal reflexes and strength makes primary lumbosacral pathology low.  She will be scheduled for NCS/EMG of the legs to evaluate for neuropathy and radiculopathy.  Further recommendations pending results.  Thank you for allowing me to participate in patient's care.  If I can answer any additional questions, I would be pleased to do so.    Sincerely,    Nekia Maxham K. Allena KatzPatel, DO

## 2018-07-25 ENCOUNTER — Other Ambulatory Visit: Payer: Self-pay | Admitting: Family Medicine

## 2018-07-29 ENCOUNTER — Encounter: Payer: Self-pay | Admitting: Neurology

## 2018-07-30 ENCOUNTER — Other Ambulatory Visit: Payer: Self-pay | Admitting: Family Medicine

## 2018-08-10 ENCOUNTER — Other Ambulatory Visit: Payer: Self-pay | Admitting: Family Medicine

## 2018-08-10 DIAGNOSIS — F411 Generalized anxiety disorder: Secondary | ICD-10-CM

## 2018-08-10 NOTE — Telephone Encounter (Signed)
Last filled per database: 07/12/18 Last written: 07/12/18 Last ov: 12/15/17 Next ov: none Contract: none UDS: none

## 2018-08-11 ENCOUNTER — Encounter (HOSPITAL_COMMUNITY): Payer: Self-pay

## 2018-08-12 ENCOUNTER — Other Ambulatory Visit: Payer: Self-pay | Admitting: Family Medicine

## 2018-08-12 DIAGNOSIS — F909 Attention-deficit hyperactivity disorder, unspecified type: Secondary | ICD-10-CM

## 2018-08-12 MED ORDER — LISDEXAMFETAMINE DIMESYLATE 70 MG PO CAPS: 70.0000 mg | ORAL_CAPSULE | Freq: Every day | ORAL | 0 refills | Status: DC

## 2018-08-12 NOTE — Telephone Encounter (Signed)
Copied from CRM [id number]. Topic: Quick Communication - Rx Refill/Question >> Aug 12, 2018  8:34 AM Arlyss Gandyichardson, Venkat Ankney N, NT wrote: Medication: lisdexamfetamine (VYVANSE) 70 MG capsule  Has the patient contacted their pharmacy? Yes.   (Agent: If no, request that the patient contact the pharmacy for the refill.) (Agent: If yes, when and what did the pharmacy advise?)  Preferred Pharmacy (with phone number or street name): [id number] [place], [place] - [address] [phone] (Phone) [phone])    Agent: Please be advised that RX refills may take up to 3 business days. We ask that you follow-up with your pharmacy.

## 2018-08-12 NOTE — Telephone Encounter (Signed)
Refill of Vyvanse  LRF 05/10/18  #30  0 refills  LOV 12/15/17 [place]   [id number] - [place], [place] - [address]           [phone] (Phone) [phone] (Fax)

## 2018-08-30 ENCOUNTER — Other Ambulatory Visit: Payer: Self-pay | Admitting: Family Medicine

## 2018-09-01 ENCOUNTER — Other Ambulatory Visit: Payer: Self-pay | Admitting: Family Medicine

## 2018-09-01 DIAGNOSIS — F332 Major depressive disorder, recurrent severe without psychotic features: Secondary | ICD-10-CM

## 2018-09-10 ENCOUNTER — Other Ambulatory Visit: Payer: Self-pay | Admitting: Family Medicine

## 2018-09-10 DIAGNOSIS — F411 Generalized anxiety disorder: Secondary | ICD-10-CM

## 2018-09-13 ENCOUNTER — Telehealth: Payer: Self-pay | Admitting: Family Medicine

## 2018-09-13 NOTE — Telephone Encounter (Signed)
Copied from CRM [id number]. Topic: Quick Communication - See Telephone Encounter >> Sep 13, 2018 12:59 PM Terisa Starraylor, Brittany L wrote: CRM for notification. See Telephone encounter for: 09/13/18.  lisdexamfetamine (VYVANSE) 70 MG capsule  [id number] [place], [place] - [address] [place] [phone]

## 2018-09-14 ENCOUNTER — Other Ambulatory Visit: Payer: Self-pay | Admitting: Family Medicine

## 2018-09-14 DIAGNOSIS — F909 Attention-deficit hyperactivity disorder, unspecified type: Secondary | ICD-10-CM

## 2018-09-14 DIAGNOSIS — F411 Generalized anxiety disorder: Secondary | ICD-10-CM

## 2018-09-14 MED ORDER — CLONAZEPAM 1 MG PO TABS: 1.0000 mg | ORAL_TABLET | Freq: Three times a day (TID) | ORAL | 0 refills | Status: DC | PRN

## 2018-09-14 MED ORDER — LISDEXAMFETAMINE DIMESYLATE 70 MG PO CAPS: 70.0000 mg | ORAL_CAPSULE | Freq: Every day | ORAL | 0 refills | Status: DC

## 2018-09-14 NOTE — Telephone Encounter (Signed)
I refill each sent in

## 2018-09-14 NOTE — Telephone Encounter (Signed)
Left detailed message on machine that she must have an ov before anymore refills.

## 2018-09-14 NOTE — Telephone Encounter (Signed)
Patient would like refill on vyvanse and pharmacy also sent request for clonazepam  Database ran on 08/10/18 and is in media for review.  Last written: vyvanse 08/12/18 and clonazepam 08/10/18 Last ov: 12/15/17 Next ov: none Contract: need UDS: need  Can we send in one last time.  Will let patient know she needs ov before any more refills

## 2018-09-26 ENCOUNTER — Other Ambulatory Visit: Payer: Self-pay | Admitting: Family Medicine

## 2018-09-26 DIAGNOSIS — F332 Major depressive disorder, recurrent severe without psychotic features: Secondary | ICD-10-CM

## 2018-09-27 ENCOUNTER — Other Ambulatory Visit: Payer: Self-pay | Admitting: Family Medicine

## 2018-09-28 ENCOUNTER — Other Ambulatory Visit: Payer: Self-pay | Admitting: Family Medicine

## 2018-09-30 ENCOUNTER — Other Ambulatory Visit: Payer: Self-pay | Admitting: Family Medicine

## 2018-09-30 DIAGNOSIS — F411 Generalized anxiety disorder: Secondary | ICD-10-CM

## 2018-10-13 ENCOUNTER — Other Ambulatory Visit: Payer: Self-pay | Admitting: Family Medicine

## 2018-10-13 DIAGNOSIS — F909 Attention-deficit hyperactivity disorder, unspecified type: Secondary | ICD-10-CM

## 2018-10-13 DIAGNOSIS — F411 Generalized anxiety disorder: Secondary | ICD-10-CM

## 2018-10-13 DIAGNOSIS — I10 Essential (primary) hypertension: Secondary | ICD-10-CM

## 2018-10-13 NOTE — Telephone Encounter (Signed)
Requested medication (s) are due for refill today: Klonopin - No   Vyvanse - Yes  Requested medication (s) are on the active medication list: Yes  Last refill:  Klonopin  09/30/18       Vyvanse  09/14/18  Future visit scheduled: No  Notes to clinic:  See request    Requested Prescriptions  Pending Prescriptions Disp Refills   lisdexamfetamine (VYVANSE) 70 MG capsule 30 capsule 0    Sig: Take 1 capsule (70 mg total) by mouth daily.     Not Delegated - Psychiatry:  Stimulants/ADHD Failed - 10/13/2018  1:37 PM      Failed - This refill cannot be delegated      Failed - Urine Drug Screen completed in last 360 days.      Failed - Valid encounter within last 3 months    Recent Outpatient Visits          10 months ago Hyperlipidemia associated with type 2 diabetes mellitus (HCC)   Holiday representative at [facility] [place], [place] R, DO   1 year ago Menopause   Holiday representative at [facility] [place], [place] R, DO   1 year ago Attention deficit disorder (ADD) without hyperactivity   Holiday representative at [facility] [place], [place] R, DO   1 year ago Nasal congestion   Holiday representative at [employer], [place], [place]   2 years ago Hyperlipidemia   Holiday representative at [employer], [place] R, DO            clonazePAM (KLONOPIN) 1 MG tablet 90 tablet 0    Sig: Take 1 tablet (1 mg total) by mouth 3 (three) times daily as needed.     Not Delegated - Psychiatry:  Anxiolytics/Hypnotics Failed - 10/13/2018  1:37 PM      Failed - This refill cannot be delegated      Failed - Urine Drug Screen completed in last 360 days.      Failed - Valid encounter within last 6 months    Recent Outpatient Visits          10 months ago Hyperlipidemia associated with type 2 diabetes mellitus (HCC)   Holiday representative at [facility] [place],  [place] R, DO   1 year ago Menopause   Holiday representative at [facility] [place], [place] R, DO   1 year ago Attention deficit disorder (ADD) without hyperactivity   Holiday representative at [facility] [place], [place] R, DO   1 year ago Nasal congestion   Holiday representative at [employer], [place], [place]   2 years ago Hyperlipidemia   Holiday representative at [facility] [place], [place], [place]

## 2018-10-13 NOTE — Telephone Encounter (Signed)
Copied from CRM [id number]. Topic: Quick Communication - Rx Refill/Question >> Oct 13, 2018  1:21 PM Baldo Daub L wrote: Medication:  lisdexamfetamine (VYVANSE) 70 MG capsule clonazePAM (KLONOPIN) 1 MG tablet  Has the patient contacted their pharmacy? Yes - pharmacy states she has to have these called in (Agent: If no, request that the patient contact the pharmacy for the refill.) (Agent: If yes, when and what did the pharmacy advise?)  Preferred Pharmacy (with phone number or street name): [id number] [place], [place] - [address] [phone] (Phone) [phone] (Fax)  Agent: Please be advised that RX refills may take up to 3 business days. We ask that you follow-up with your pharmacy.

## 2018-10-14 MED ORDER — LISDEXAMFETAMINE DIMESYLATE 70 MG PO CAPS: 70.0000 mg | ORAL_CAPSULE | Freq: Every day | ORAL | 0 refills | Status: DC

## 2018-10-14 NOTE — Telephone Encounter (Signed)
Pt is requesting refill on Vyvanse.   Last OV: 09/14/2018 Last Fill: 09/14/2018 #30 and 0RF UDS: 06/09/2017 Low risk

## 2018-10-25 ENCOUNTER — Telehealth: Payer: Self-pay | Admitting: *Deleted

## 2018-10-25 MED ORDER — METFORMIN HCL ER 500 MG PO TB24: ORAL_TABLET | ORAL | 0 refills | Status: DC

## 2018-10-25 NOTE — Telephone Encounter (Signed)
[facility]. Pt is past due for follow up. Last appt 12/15/17. 2 week supply sent to pharmacy and mychart message sent advising pt to schedule appt before additional refills are needed.

## 2018-10-26 ENCOUNTER — Other Ambulatory Visit: Payer: Self-pay | Admitting: Family Medicine

## 2018-10-26 DIAGNOSIS — E538 Deficiency of other specified B group vitamins: Secondary | ICD-10-CM

## 2018-11-02 ENCOUNTER — Other Ambulatory Visit: Payer: Self-pay | Admitting: *Deleted

## 2018-11-02 DIAGNOSIS — F332 Major depressive disorder, recurrent severe without psychotic features: Secondary | ICD-10-CM

## 2018-11-02 MED ORDER — DESVENLAFAXINE SUCCINATE ER 50 MG PO TB24: 50.0000 mg | ORAL_TABLET | Freq: Every day | ORAL | 0 refills | Status: DC

## 2018-11-08 ENCOUNTER — Ambulatory Visit: Payer: Self-pay

## 2018-11-08 ENCOUNTER — Other Ambulatory Visit: Payer: Self-pay | Admitting: Family Medicine

## 2018-11-08 NOTE — Telephone Encounter (Signed)
Pt called stating that she has facial swelling since yesterday.Just the right side of her face. Pt states she has has a sinus infection.  She now states that the right ear is very painful. She denies problems with her teeth.  No pain until the swelling. No rash,no fever, no leg swelling. Pt states that the swelling stops at her chin and does not extend to her neck. Pt has appointment tomorrow at practice but per protocol needs to be seen today. Pt advised to be seen at urgent care. Pt states she has already been to urgent care today and they told her to go to the ED. Pt was urges to follow the urgent care instructions and go to the ED. Pt agreed to instruction but wanted to keep tomorrows appointment. Pt is aware of cancellation policy. Care advice read to patient with instructions to be seen by ED as per instructions of Urgent care provider. Pt verbalized understanding of all instructions  Reason for Disposition . Swelling is painful to touch  Answer Assessment - Initial Assessment Questions 1. ONSET: "When did the swelling start?" (e.g., minutes, hours, days)     yesterday 2. LOCATION: "What part of the face is swollen?"     Rt side only 3. SEVERITY: "How swollen is it?"     Moderate eye is not swollen shut 4. ITCHING: "Is there any itching?" If so, ask: "How much?"   (Scale 1-10; mild, moderate or severe)     no 5. PAIN: "Is the swelling painful to touch?" If so, ask: "How painful is it?"   (Scale 1-10; mild, moderate or severe)     Ear aching 10 6. FEVER: "Do you have a fever?" If so, ask: "What is it, how was it measured, and when did it start?"      no 7. CAUSE: "What do you think is causing the face swelling?"     unknown 8. RECURRENT SYMPTOM: "Have you had face swelling before?" If so, ask: "When was the last time?" "What happened that time?"     no 9. OTHER SYMPTOMS: "Do you have any other symptoms?" (e.g., toothache, leg swelling)     No  10. PREGNANCY: "Is there any chance you are  pregnant?" "When was your last menstrual period?"       No hysterectomy  Protocols used: [facility]

## 2018-11-09 ENCOUNTER — Encounter: Payer: Self-pay | Admitting: Medical

## 2018-11-09 ENCOUNTER — Ambulatory Visit: Payer: 59 | Admitting: Medical

## 2018-11-09 VITALS — BP 150/98 | HR 90 | Temp 98.4°F | Resp 16 | Ht 65.0 in | Wt 235.8 lb

## 2018-11-09 DIAGNOSIS — I10 Essential (primary) hypertension: Secondary | ICD-10-CM | POA: Diagnosis not present

## 2018-11-09 DIAGNOSIS — K047 Periapical abscess without sinus: Secondary | ICD-10-CM | POA: Diagnosis not present

## 2018-11-09 DIAGNOSIS — J3489 Other specified disorders of nose and nasal sinuses: Secondary | ICD-10-CM | POA: Diagnosis not present

## 2018-11-09 DIAGNOSIS — H9201 Otalgia, right ear: Secondary | ICD-10-CM

## 2018-11-09 MED ORDER — CEFTRIAXONE SODIUM 1 G IJ SOLR
1.0000 g | Freq: Once | INTRAMUSCULAR | Status: AC
  Administered 2018-11-09: 1 g via INTRAMUSCULAR

## 2018-11-09 MED ORDER — TRAMADOL HCL 50 MG PO TABS: 50.0000 mg | ORAL_TABLET | Freq: Four times a day (QID) | ORAL | 0 refills | Status: AC | PRN

## 2018-11-09 MED ORDER — AMOXICILLIN-POT CLAVULANATE 875-125 MG PO TABS: 1.0000 | ORAL_TABLET | Freq: Two times a day (BID) | ORAL | 0 refills | Status: DC

## 2018-11-09 MED FILL — traMADol HCL 50 MG TABS: 50 | 1 days supply | Qty: 4 | Fill #0

## 2018-11-09 NOTE — Patient Instructions (Signed)
You did appear to have probable tooth infection.  We gave you Rocephin 1 g IM injection today and please start Augmentin antibiotic.  For moderate pain he can use ibuprofen 400 mg every 8 hours.  For more severe pain giving limited number of tramadol to use.  Very important for you to go ahead and try to see dentist tomorrow so you can get x-ray films to assess location of potential tooth infection.  You do have some ear pain presently but no otitis media on exam.  Some sinus pressure recently more likely source of all pain from tooth infection.  Your blood pressure is elevated today but you have not taken blood pressure medication.  Very important to take your blood pressure medication daily particularly when using low-dose ibuprofen.  Follow-up in 7 days or as needed.  You expressed concern for complications regarding tooth infection.  To reduce potential complications dentist appointment tomorrow very important.  If you get worse signs or symptoms despite the above measures then recommend ED evaluation.

## 2018-11-09 NOTE — Progress Notes (Signed)
Subjective:    Patient ID: [name], female    DOB: August 24, 1966, 52 y.o.   MRN: [id number]  HPI  Pt in with some rt side jaw area swelling. This swelling was worse yesterday. Some rt side sinus pain as well. Some mild pain radiating to her rt ear. 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  No fever, no chills or sweats.    Review of Systems  Constitutional: Negative for chills, fatigue and fever.  HENT: Positive for sinus pressure and sinus pain. Negative for congestion, postnasal drip, sneezing and sore throat.        Rt side jaw region swelling.   Respiratory: Negative for cough, chest tightness, shortness of breath and wheezing.   Cardiovascular: Negative for chest pain and palpitations.  Gastrointestinal: Negative for abdominal pain.  Musculoskeletal: Negative for back pain and gait problem.  Neurological: Negative for dizziness, syncope, weakness, numbness and headaches.  Hematological: Positive for adenopathy. Does not bruise/bleed easily.  Psychiatric/Behavioral: Negative for behavioral problems and confusion.    Past Medical History:  Diagnosis Date  . Abdominal pain   . Anemia   . Arthritis   . Bruises easily   . Depression   . Extremity numbness    legs, feet, arms, hands  . Foot fracture    bilateral  . Frequent falls   . Hair loss   . Hypotension   . Irritable bowel   . Kidney stones   . Leg pain    when driving long distances  . Memory loss   . Migraines   . Panic disorder   . Vision problems      Social History   Socioeconomic History  . Marital status: Married    Spouse name: Not on file  . Number of children: 1  . Years of education: 54  . Highest education level: Not on file  Occupational History  . 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 Financial resource strain: Not on file  . Food insecurity:    Worry: Not on file    Inability: Not on file  . Transportation needs:    Medical: Not on file      Non-medical: Not on file  Tobacco Use  . Smoking status: Current Every Day Smoker    Packs/day: 1.00    Years: 30.00    Pack years: 30.00  . Smokeless tobacco: Never Used  Substance and Sexual Activity  . Alcohol use: No  . Drug use: No  . Sexual activity: Yes    Partners: Male  Lifestyle  . Physical activity:    Days per week: Not on file    Minutes per session: Not on file  . Stress: Not on file  Relationships  . Social connections:    Talks on phone: Not on file    Gets together: Not on file    Attends religious service: Not on file    Active member of club or organization: Not on file    Attends meetings of clubs or organizations: Not on file    Relationship status: Not on file  . Intimate partner violence:    Fear of current or ex partner: Not on file    Emotionally abused: Not on file    Physically abused: Not on file    Forced sexual activity: Not on file  Other Topics Concern  . Not on file  Social History Narrative   She lives with husband.  They  have one grown child.   Highest level of education:  Associates degree in early childhood    Past Surgical History:  Procedure Laterality Date  . ABDOMINAL HYSTERECTOMY    . ABDOMINOPLASTY    . CHOLECYSTECTOMY    . FOOT SURGERY Left    Plantar Fasc  . LAPAROSCOPIC GASTRIC BANDING    . TONSILLECTOMY      Family History  Problem Relation Age of Onset  . Hypertension Mother   . Deep vein thrombosis Mother        Finger  . Panic disorder Mother   . Hypertension Father   . Heart attack Father        Deceased, 40s  . Diabetes Maternal Grandmother   . Heart disease Maternal Grandmother        chf MI  . Heart disease Maternal Grandfather 73       MI    No Known Allergies  Current Outpatient Medications on File Prior to Visit  Medication Sig Dispense Refill  . albuterol (PROAIR HFA) 108 (90 Base) MCG/ACT inhaler Inhale 2 puffs into the lungs every 6 (six) hours as needed for wheezing or shortness of  breath. 1 Inhaler 3  . clonazePAM (KLONOPIN) 1 MG tablet TAKE 1 TABLET (1 MG TOTAL) BY MOUTH 3 (THREE) TIMES DAILY AS NEEDED. 90 tablet 0  . cyanocobalamin (,VITAMIN B-12,) 1000 MCG/ML injection INJECT 1 ML (1,000 MCG TOTAL) INTO THE MUSCLE EVERY 30 (THIRTY) DAYS.  Needs ov 3 mL 0  . desvenlafaxine (PRISTIQ) 50 MG 24 hr tablet Take 1 tablet (50 mg total) by mouth daily. Needs ov before any more refills 15 tablet 0  . gabapentin (NEURONTIN) 300 MG capsule Take 1 capsule (300 mg total) by mouth 3 (three) times daily. Needs ov 90 capsule 0  . glucose blood (ONETOUCH VERIO) test strip Use as directed once day.  DX: E11.51. Needs ov before any more refills 100 each 0  . lisdexamfetamine (VYVANSE) 70 MG capsule Take 1 capsule (70 mg total) by mouth daily. 30 capsule 0  . lisinopril-hydrochlorothiazide (ZESTORETIC) 20-12.5 MG tablet Take 2 tablets by mouth daily. 180 tablet 3  . metFORMIN (GLUCOPHAGE-XR) 500 MG 24 hr tablet Take 1 tablet in the morning and 2 tablets in the evening.  Past due for ov 21 tablet 0  . ONETOUCH DELICA LANCETS FINE MISC USE AS DIRECTED ONCE A DAY. DX: E11.51 *INSURANCE ONLY COVERS 30 DAY SUPPLY *PT NEEDS OFFICE VISIT 100 each 0  . SYRINGE-NEEDLE, DISP, 3 ML 25G X 1" 3 ML MISC 10 each by Does not apply route as directed. 10 each 2  . Vitamin D, Ergocalciferol, (DRISDOL) 50000 units CAPS capsule TAKE 1 CAPSULE (50,000 UNITS TOTAL) BY MOUTH EVERY 7 (SEVEN) DAYS. 4 capsule 2   No current facility-administered medications on file prior to visit.     BP (!) 153/103   Pulse 90   Temp 98.4 F (36.9 C) (Oral)   Resp 16   Ht 5\' 5"  (1.651 m)   Wt 235 lb 12.8 oz (107 kg)   SpO2 100%   BMI 39.24 kg/m       Objective:   Physical Exam  General  Mental Status - Alert. General Appearance - Well groomed. Not in acute distress.  Skin Rashes- No Rashes.  HEENT Head- Normal. Ear Auditory Canal - Left- Normal. Right - Normal.Tympanic Membrane- Left- Normal. Right- Normal. Eye  Sclera/Conjunctiva- Left- Normal. Right- Normal. Nose & Sinuses Nasal Mucosa- Left-  Boggy and Congested.  Right-  Boggy and  Congested.rt side faint maxillary but no  frontal sinus pressure. Mouth & Throat Lips: Upper Lip- Normal: no dryness, cracking, pallor, cyanosis, or vesicular eruption. Lower Lip-Normal: no dryness, cracking, pallor, cyanosis or vesicular eruption. Buccal Mucosa- Bilateral- No Aphthous ulcers. Oropharynx- No Discharge or Erythema. Tonsils: Characteristics- Bilateral- No Erythema or Congestion. Size/Enlargement- Bilateral- No enlargement. Discharge- bilateral-None. Rt side jaw-mandible mild diffuse tender. Inside of mouth rt lower teeth broken teeth with dark/black color to teeth stubs.   Neck Neck- Supple. No Masses.   Chest and Lung Exam Auscultation: Breath Sounds:-Clear even and unlabored.  Cardiovascular Auscultation:Rythm- Regular, rate and rhythm. Murmurs & Other Heart Sounds:Ausculatation of the heart reveal- No Murmurs.  Lymphatic Head & Neck General Head & Neck Lymphatics: Bilateral: Description- mild enlarged and tender submandibular nodes       Assessment & Plan:  ou did appear to have probable tooth infection.  We gave you Rocephin 1 g IM injection today and please start Augmentin antibiotic.  For moderate pain he can use ibuprofen 400 mg every 8 hours.  For more severe pain giving limited number of tramadol to use.  Very important for you to go ahead and try to see dentist tomorrow so you can get x-ray films to assess location of potential tooth infection.  You do have some ear pain presently but no otitis media on exam.  Some sinus pressure recently more likely source of all pain from tooth infection.  Your blood pressure is elevated today but you have not taken blood pressure medication.  Very important to take your blood pressure medication daily particularly when using low-dose ibuprofen.  Follow-up in 7 days or as needed.  You expressed  concern for complications regarding tooth infection.  To reduce potential complications dentist appointment tomorrow very important.  If you get worse signs or symptoms despite the above measures then recommend ED evaluation.  Esperanza Richters, PA-C

## 2018-11-12 ENCOUNTER — Other Ambulatory Visit: Payer: Self-pay | Admitting: Family Medicine

## 2018-11-12 DIAGNOSIS — F909 Attention-deficit hyperactivity disorder, unspecified type: Secondary | ICD-10-CM

## 2018-11-12 DIAGNOSIS — F411 Generalized anxiety disorder: Secondary | ICD-10-CM

## 2018-11-12 NOTE — Telephone Encounter (Signed)
Copied from CRM [phone]. Topic: Quick Communication - Rx Refill/Question >> Nov 12, 2018  3:37 PM Raoul PitchWilliams-Neal, Sade R wrote: Medication:lisdexamfetamine (VYVANSE) 70 MG capsule , clonazePAM (KLONOPIN) 1 MG tablet  Has the patient contacted their pharmacy?Yes  Preferred Pharmacy (with phone number or street name): [id number] - [place], [place] - [address] [phone] (Phone) [phone] (Fax)    Agent: Please be advised that RX refills may take up to 3 business days. We ask that you follow-up with your pharmacy.

## 2018-11-12 NOTE — Telephone Encounter (Signed)
Requested medication (s) are due for refill today: yes  Requested medication (s) are on the active medication list: yes  Last refill:  Klonopin last filled on 09/30/18 #90 and Vyvanse last filled on 10/14/18 #30  Future visit scheduled: no  Notes to clinic:  Unable to fill per protocol     Requested Prescriptions  Pending Prescriptions Disp Refills   clonazePAM (KLONOPIN) 1 MG tablet 90 tablet 0    Sig: Take 1 tablet (1 mg total) by mouth 3 (three) times daily as needed.     Not Delegated - Psychiatry:  Anxiolytics/Hypnotics Failed - 11/12/2018  4:00 PM      Failed - This refill cannot be delegated      Failed - Urine Drug Screen completed in last 360 days.      Passed - Valid encounter within last 6 months    Recent Outpatient Visits          3 days ago Tooth infection   [facility] [place], [place], [place]   11 months ago Hyperlipidemia associated with type 2 diabetes mellitus (HCC)   [facility] [place], [place], DO   1 year ago Menopause   [facility] [place], [place] R, DO   1 year ago Attention deficit disorder (ADD) without hyperactivity   [facility] [place], [place], DO   1 year ago Nasal congestion   [facility] [place], Ramon [place], PA-C            lisdexamfetamine (VYVANSE) 70 MG capsule 30 capsule 0    Sig: Take 1 capsule (70 mg total) by mouth daily.     Not Delegated - Psychiatry:  Stimulants/ADHD Failed - 11/12/2018  4:00 PM      Failed - This refill cannot be delegated      Failed - Urine Drug Screen completed in last 360 days.      Passed - Valid encounter within last 3 months    Recent Outpatient Visits          3 days ago Tooth infection   [facility] [place], [place], [place]   11 months ago  Hyperlipidemia associated with type 2 diabetes mellitus (HCC)   [facility] [place] [facility] [place], [place], DO   1 year ago Menopause   [facility] [place], [place] R, DO   1 year ago Attention deficit disorder (ADD) without hyperactivity   [facility] [place], [place] R, DO   1 year ago Nasal congestion   [facility] [place], [place], [place]

## 2018-11-15 ENCOUNTER — Ambulatory Visit: Payer: 59 | Admitting: Podiatry

## 2018-11-15 ENCOUNTER — Encounter: Payer: Self-pay | Admitting: Podiatry

## 2018-11-15 ENCOUNTER — Ambulatory Visit (INDEPENDENT_AMBULATORY_CARE_PROVIDER_SITE_OTHER): Payer: 59

## 2018-11-15 ENCOUNTER — Other Ambulatory Visit: Payer: Self-pay | Admitting: Family Medicine

## 2018-11-15 DIAGNOSIS — I739 Peripheral vascular disease, unspecified: Secondary | ICD-10-CM

## 2018-11-15 DIAGNOSIS — F411 Generalized anxiety disorder: Secondary | ICD-10-CM

## 2018-11-15 DIAGNOSIS — M722 Plantar fascial fibromatosis: Secondary | ICD-10-CM

## 2018-11-15 DIAGNOSIS — M7730 Calcaneal spur, unspecified foot: Secondary | ICD-10-CM | POA: Diagnosis not present

## 2018-11-15 DIAGNOSIS — E0843 Diabetes mellitus due to underlying condition with diabetic autonomic (poly)neuropathy: Secondary | ICD-10-CM

## 2018-11-15 MED ORDER — CLONAZEPAM 1 MG PO TABS: 1.0000 mg | ORAL_TABLET | Freq: Three times a day (TID) | ORAL | 0 refills | Status: DC | PRN

## 2018-11-15 MED ORDER — LISDEXAMFETAMINE DIMESYLATE 70 MG PO CAPS: 70.0000 mg | ORAL_CAPSULE | Freq: Every day | ORAL | 0 refills | Status: DC

## 2018-11-15 MED ORDER — MELOXICAM 15 MG PO TABS: 15.0000 mg | ORAL_TABLET | Freq: Every day | ORAL | 1 refills | Status: AC

## 2018-11-15 MED ORDER — METHYLPREDNISOLONE 4 MG PO TBPK: ORAL_TABLET | ORAL | 0 refills | Status: DC

## 2018-11-15 NOTE — Telephone Encounter (Signed)
Pt is requesting refill on Vyvanse and clonazepam.  Last OV: 11/09/2018 w/ Ramon DredgeEdward Last Fill on Vyvanse: 10/14/2018 #30 and [email or web address] Last Fill on clonazepam: 09/30/2018 #90 and [id number] UDS: 06/09/2017 Low risk

## 2018-11-16 ENCOUNTER — Other Ambulatory Visit: Payer: Self-pay | Admitting: *Deleted

## 2018-11-16 MED ORDER — "SYRINGE 25G X 5/8"" 3 ML MISC": 0 refills | Status: DC

## 2018-11-21 ENCOUNTER — Other Ambulatory Visit: Payer: Self-pay | Admitting: Family Medicine

## 2018-11-21 NOTE — Progress Notes (Signed)
   HPI: 52 year old meat female h/o DM type II currently controlled, PVD, peripheral polyneuropathy, morbid obesity presents the office today for bilateral heel pain.  She went to Dr. Elijah Birkom, podiatrist and was told that she had bone spurs.  Patient has tried everything for the pain.  Conservative treatment modalities have not improved her symptoms.  Patient has tried immobilization cam boot, anti-inflammatory steroid injections however nothing has helped to alleviate her symptoms.  There is been ongoing for approximately 6 years now.  The patient is seen multiple physicians including neurology and primary care for evaluation of her bilateral foot and leg pain.  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  There is currently nothing to alleviate her symptoms.  She presents for further treatment evaluation  Past Medical History:  Diagnosis Date  . Abdominal pain   . Anemia   . Arthritis   . Bruises easily   . Depression   . Extremity numbness    legs, feet, arms, hands  . Foot fracture    bilateral  . Frequent falls   . Hair loss   . Hypotension   . Irritable bowel   . Kidney stones   . Leg pain    when driving long distances  . Memory loss   . Migraines   . Panic disorder   . Vision problems      Physical Exam: General: The patient is alert and oriented x3 in no acute distress.  Dermatology: Skin is warm, dry and supple bilateral lower extremities. Negative for open lesions or macerations.  Vascular: Palpable pedal pulses bilaterally.  Her extremities are cold to touch with a delayed capillary refill with purple mottled discoloration.  Consistent with small vessel disease and possible CRPS  Neurological: Epicritic and protective threshold grossly intact bilaterally.   Musculoskeletal Exam: Range of motion within normal limits to all pedal and ankle joints bilateral. Muscle strength 5/5 in all groups bilateral.  Significant amount of pain on palpation to the medial  calcaneal tubercle at the insertion of the plantar fascia bilateral  Radiographic Exam:  Normal osseous mineralization. Joint spaces preserved. No fracture/dislocation/boney destruction.    Assessment: 1. DM type II w/ peripheral polyneuropathy 2.  Plantar fasciitis bilateral  Plan of Care:  1. Patient evaluated. X-Rays reviewed.  2.  Continue medication management as per PCP, Dr. Loreen FreudYvonne Lowne 3.  Nerve conduction study with EMG was scheduled with neurology for 07/19/2018 however it appears that the study was never completed.  Recommend follow-up with neurology 4.  Prescription for Medrol Dosepak 5.  Prescription for meloxicam 15 mg daily 6.  Conservative treatment modalities have been very unsuccessful in alleviating any sort of symptoms.  Patient has failed immobilization cam boot, plantar fascial bracing, custom molded insoles, anti-inflammatory injections. 7.  Return to clinic in 4 weeks      Felecia ShellingBrent M. , DPM [facility]  Dr. Felecia ShellingBrent M. , DPM    [address]                                        [place], [place] [id number]                Office [phone]  Fax [phone]

## 2018-11-29 ENCOUNTER — Encounter (HOSPITAL_COMMUNITY): Payer: Self-pay

## 2018-11-29 ENCOUNTER — Other Ambulatory Visit: Payer: Self-pay | Admitting: Family Medicine

## 2018-11-29 DIAGNOSIS — F332 Major depressive disorder, recurrent severe without psychotic features: Secondary | ICD-10-CM

## 2018-11-30 NOTE — Telephone Encounter (Signed)
appt scheduled 12/14/18 @ 9:45am

## 2018-12-09 ENCOUNTER — Other Ambulatory Visit: Payer: Self-pay | Admitting: Family Medicine

## 2018-12-09 DIAGNOSIS — I10 Essential (primary) hypertension: Secondary | ICD-10-CM

## 2018-12-14 ENCOUNTER — Encounter: Payer: Self-pay | Admitting: Family Medicine

## 2018-12-14 ENCOUNTER — Ambulatory Visit: Payer: 59 | Admitting: Family Medicine

## 2018-12-14 VITALS — BP 142/102 | HR 115 | Temp 97.5°F | Resp 16 | Ht 65.0 in | Wt 232.0 lb

## 2018-12-14 DIAGNOSIS — E1165 Type 2 diabetes mellitus with hyperglycemia: Secondary | ICD-10-CM

## 2018-12-14 DIAGNOSIS — I10 Essential (primary) hypertension: Secondary | ICD-10-CM

## 2018-12-14 DIAGNOSIS — E785 Hyperlipidemia, unspecified: Secondary | ICD-10-CM

## 2018-12-14 DIAGNOSIS — Z23 Encounter for immunization: Secondary | ICD-10-CM

## 2018-12-14 DIAGNOSIS — R739 Hyperglycemia, unspecified: Secondary | ICD-10-CM | POA: Diagnosis not present

## 2018-12-14 DIAGNOSIS — E559 Vitamin D deficiency, unspecified: Secondary | ICD-10-CM

## 2018-12-14 DIAGNOSIS — E039 Hypothyroidism, unspecified: Secondary | ICD-10-CM

## 2018-12-14 DIAGNOSIS — IMO0002 Reserved for concepts with insufficient information to code with codable children: Secondary | ICD-10-CM

## 2018-12-14 DIAGNOSIS — E538 Deficiency of other specified B group vitamins: Secondary | ICD-10-CM

## 2018-12-14 DIAGNOSIS — Z79899 Other long term (current) drug therapy: Secondary | ICD-10-CM

## 2018-12-14 DIAGNOSIS — E1151 Type 2 diabetes mellitus with diabetic peripheral angiopathy without gangrene: Secondary | ICD-10-CM

## 2018-12-14 DIAGNOSIS — E1169 Type 2 diabetes mellitus with other specified complication: Secondary | ICD-10-CM

## 2018-12-14 DIAGNOSIS — F321 Major depressive disorder, single episode, moderate: Secondary | ICD-10-CM

## 2018-12-14 DIAGNOSIS — R42 Dizziness and giddiness: Secondary | ICD-10-CM

## 2018-12-14 DIAGNOSIS — F988 Other specified behavioral and emotional disorders with onset usually occurring in childhood and adolescence: Secondary | ICD-10-CM

## 2018-12-14 DIAGNOSIS — F411 Generalized anxiety disorder: Secondary | ICD-10-CM

## 2018-12-14 DIAGNOSIS — G8929 Other chronic pain: Secondary | ICD-10-CM

## 2018-12-14 DIAGNOSIS — F418 Other specified anxiety disorders: Secondary | ICD-10-CM

## 2018-12-14 LAB — TSH: TSH: 1.49 u[IU]/mL (ref 0.35–4.50)

## 2018-12-14 LAB — VITAMIN B12: Vitamin B-12: 809 pg/mL (ref 211–911)

## 2018-12-14 LAB — COMPREHENSIVE METABOLIC PANEL
ALK PHOS: 91 U/L (ref 39–117)
ALT: 21 U/L (ref 0–35)
AST: 22 U/L (ref 0–37)
Albumin: 4.2 g/dL (ref 3.5–5.2)
BILIRUBIN TOTAL: 0.9 mg/dL (ref 0.2–1.2)
BUN: 8 mg/dL (ref 6–23)
CALCIUM: 9.7 mg/dL (ref 8.4–10.5)
CO2: 26 mEq/L (ref 19–32)
Chloride: 100 mEq/L (ref 96–112)
Creatinine, Ser: 0.9 mg/dL (ref 0.40–1.20)
GFR: 69.45 mL/min (ref >60.00)
GLUCOSE: 134 mg/dL — AB (ref 70–99)
POTASSIUM: 3.6 meq/L (ref 3.5–5.1)
Sodium: 137 mEq/L (ref 135–145)
TOTAL PROTEIN: 7.1 g/dL (ref 6.0–8.3)

## 2018-12-14 LAB — VITAMIN D 25 HYDROXY (VIT D DEFICIENCY, FRACTURES): VITD: 23.8 ng/mL — ABNORMAL LOW (ref 30.00–100.00)

## 2018-12-14 LAB — LIPID PANEL
Cholesterol: 185 mg/dL (ref 0–200)
HDL: 44.7 mg/dL (ref >39.00)
LDL Cholesterol: 107 mg/dL — ABNORMAL HIGH (ref 0–99)
NonHDL: 140
Total CHOL/HDL Ratio: 4
Triglycerides: 163 mg/dL — ABNORMAL HIGH (ref 0.0–149.0)
VLDL: 32.6 mg/dL (ref 0.0–40.0)

## 2018-12-14 LAB — HEMOGLOBIN A1C: HEMOGLOBIN A1C: 6.7 % — AB (ref 4.6–6.5)

## 2018-12-14 MED ORDER — FLUOXETINE HCL 20 MG PO TABS: 20.0000 mg | ORAL_TABLET | Freq: Every day | ORAL | 3 refills | Status: DC

## 2018-12-14 MED ORDER — GABAPENTIN 600 MG PO TABS: 600.0000 mg | ORAL_TABLET | Freq: Three times a day (TID) | ORAL | 3 refills | Status: DC

## 2018-12-14 MED ORDER — MECLIZINE HCL 25 MG PO TABS: 25.0000 mg | ORAL_TABLET | Freq: Three times a day (TID) | ORAL | 0 refills | Status: DC | PRN

## 2018-12-14 MED ORDER — LISINOPRIL-HYDROCHLOROTHIAZIDE 20-12.5 MG PO TABS: 2.0000 | ORAL_TABLET | Freq: Every day | ORAL | 3 refills | Status: DC

## 2018-12-14 NOTE — Progress Notes (Signed)
Patient ID: [name], female    DOB: Oct 07, 1966  Age: 52 y.o. MRN: [id number]    Subjective:  Subjective  HPI [name] presents for f/u dm, chol and bp and she c/o depression.  She is very depressed --- her son is on heroine but he is now in rehab She is also here c/o dizziness --  Some congestion but no sinus symptoms   HYPERTENSION   Blood pressure range-high  Chest pain- no      Dyspnea- no Lightheadedness- no   Edema- no  Other side effects - no   Medication compliance: good Low salt diet- no    DIABETES    Blood Sugar ranges-good per pt  Polyuria- no New Visual problems- no  Hypoglycemic symptoms- no  Other side effects-no Medication compliance - good Last eye exam- due Foot exam- today   HYPERLIPIDEMIA  Medication compliance- good RUQ pain- no  Muscle aches- no Other side effects-no    Review of Systems  Constitutional: Negative for chills and fever.  HENT: Negative for congestion and hearing loss.   Eyes: Negative for discharge.  Respiratory: Negative for cough and shortness of breath.   Cardiovascular: Negative for chest pain, palpitations and leg swelling.  Gastrointestinal: Negative for abdominal pain, blood in stool, constipation, diarrhea, nausea and vomiting.  Genitourinary: Negative for dysuria, frequency, hematuria and urgency.  Musculoskeletal: Negative for back pain and myalgias.  Skin: Negative for rash.  Allergic/Immunologic: Negative for environmental allergies.  Neurological: Positive for dizziness. Negative for weakness and headaches.  Hematological: Does not bruise/bleed easily.  Psychiatric/Behavioral: Positive for dysphoric mood. Negative for suicidal ideas. The patient is not nervous/anxious.     History Past Medical History:  Diagnosis Date  . Abdominal pain   . Anemia   . Arthritis   . Bruises easily   . Depression   . Extremity numbness    legs, feet, arms, hands  . Foot fracture    bilateral  . Frequent falls   .  Hair loss   . Hypotension   . Irritable bowel   . Kidney stones   . Leg pain    when driving long distances  . Memory loss   . Migraines   . Panic disorder   . Vision problems     She has a past surgical history that includes Cholecystectomy; Laparoscopic gastric banding; Foot surgery (Left); Abdominoplasty; Abdominal hysterectomy; and Tonsillectomy.   Her family history includes Deep vein thrombosis in her mother; Diabetes in her maternal grandmother; Heart attack in her father; Heart disease in her maternal grandmother; Heart disease (age of onset: 21) in her maternal grandfather; Hypertension in her father and mother; Panic disorder in her mother.She reports that she has been smoking. She has a 30.00 pack-year smoking history. She has never used smokeless tobacco. She reports that she does not drink alcohol or use drugs.  Current Outpatient Medications on File Prior to Visit  Medication Sig Dispense Refill  . albuterol (PROAIR HFA) 108 (90 Base) MCG/ACT inhaler Inhale 2 puffs into the lungs every 6 (six) hours as needed for wheezing or shortness of breath. 1 Inhaler 3  . clonazePAM (KLONOPIN) 1 MG tablet Take 1 tablet (1 mg total) by mouth 3 (three) times daily as needed. 90 tablet 0  . cyanocobalamin (,VITAMIN B-12,) 1000 MCG/ML injection INJECT 1 ML (1,000 MCG TOTAL) INTO THE MUSCLE EVERY 30 (THIRTY) DAYS.  Needs ov 3 mL 0  . glucose blood (ONETOUCH VERIO) test strip Use as  directed once day.  DX: E11.51. Needs ov before any more refills 100 each 0  . lisdexamfetamine (VYVANSE) 70 MG capsule Take 1 capsule (70 mg total) by mouth daily. 30 capsule 0  . meloxicam (MOBIC) 15 MG tablet Take 1 tablet (15 mg total) by mouth daily. 60 tablet 1  . metFORMIN (GLUCOPHAGE-XR) 500 MG 24 hr tablet Take 1 tablet in the morning and 2 tablets in the evening.  Past due for ov 21 tablet 0  . ONETOUCH DELICA LANCETS FINE MISC USE AS DIRECTED ONCE A DAY. DX: E11.51 *INSURANCE ONLY COVERS 30 DAY SUPPLY *PT  NEEDS OFFICE VISIT 100 each 0  . Syringe/Needle, Disp, (SYRINGE 3CC/25GX5/8") 25G X 5/8" 3 ML MISC Use 1 monthly as directed with b12 5 each 0  . traMADol (ULTRAM) 50 MG tablet Take 1 tablet (50 mg total) by mouth every 6 (six) hours as needed for severe pain. 4 tablet 0  . Vitamin D, Ergocalciferol, (DRISDOL) 50000 units CAPS capsule TAKE 1 CAPSULE (50,000 UNITS TOTAL) BY MOUTH EVERY 7 (SEVEN) DAYS. 4 capsule 2   No current facility-administered medications on file prior to visit.      Objective:  Objective  Physical Exam Vitals signs and nursing note reviewed.  Constitutional:      General: She is not in acute distress.    Appearance: She is well-developed.  HENT:     Head: Normocephalic and atraumatic.     Right Ear: Tympanic membrane, ear canal and external ear normal.     Left Ear: Tympanic membrane, ear canal and external ear normal.     Nose: Nose normal.  Eyes:     Conjunctiva/sclera: Conjunctivae normal.     Pupils: Pupils are equal, round, and reactive to light.  Neck:     Musculoskeletal: Normal range of motion and neck supple.     Thyroid: No thyromegaly.     Vascular: No carotid bruit or JVD.  Cardiovascular:     Rate and Rhythm: Normal rate and regular rhythm.     Heart sounds: Normal heart sounds. No murmur.  Pulmonary:     Effort: Pulmonary effort is normal. No respiratory distress.     Breath sounds: Normal breath sounds. No wheezing or rales.  Chest:     Chest wall: No tenderness.  Neurological:     Mental Status: She is alert and oriented to person, place, and time.  Psychiatric:        Mood and Affect: Mood is anxious and depressed. Mood is not elated. Affect is not labile, blunt, flat, angry or tearful.        Speech: Speech normal.        Behavior: Behavior normal.        Thought Content: Thought content normal.        Cognition and Memory: Cognition and memory normal.        Judgment: Judgment normal.    Diabetic Foot Exam - Simple   Simple Foot  Form Diabetic Foot exam was performed with the following findings:  Yes 12/14/2018 10:18 AM  Visual Inspection No deformities, no ulcerations, no other skin breakdown bilaterally:  Yes Sensation Testing See comments:  Yes Pulse Check Posterior Tibialis and Dorsalis pulse intact bilaterally:  Yes Comments Pt can only feel top of feet  Pt sees neuro and podiatry    BP (!) 142/102   Pulse (!) 115   Temp (!) 97.5 F (36.4 C) (Oral)   Resp 16   Ht 5\' 5"  (  1.651 m)   Wt 232 lb (105.2 kg)   SpO2 99%   BMI 38.61 kg/m  Wt Readings from Last 3 Encounters:  12/14/18 232 lb (105.2 kg)  11/09/18 235 lb 12.8 oz (107 kg)  07/19/18 231 lb (104.8 kg)     Lab Results  Component Value Date   WBC 8.2 06/09/2017   HGB 16.3 (H) 06/09/2017   HCT 48.1 (H) 06/09/2017   PLT 286.0 06/09/2017   GLUCOSE 73 12/15/2017   CHOL 196 12/15/2017   TRIG 218.0 (H) 12/15/2017   HDL 45.00 12/15/2017   LDLDIRECT 141.0 12/15/2017   LDLCALC 111 (H) 12/28/2015   ALT 20 12/15/2017   AST 21 12/15/2017   NA 139 12/15/2017   K 4.1 12/15/2017   CL 102 12/15/2017   CREATININE 0.81 12/15/2017   BUN 7 12/15/2017   CO2 28 12/15/2017   TSH 1.50 12/15/2017   INR 0.97 06/23/2014   HGBA1C 6.2 12/15/2017   MICROALBUR 0.9 12/15/2017    Vas [place] Lower Ext Art Seg Multi (segmentals & [place])  Result Date: 04/27/2018 LOWER EXTREMITY DOPPLER STUDY Indications: Patient complains of burning and pain in her legs for a few years.              She denies any claudication symptoms. High Risk Factors: Hypertension, hyperlipidemia, non insulin dependent diabetes                    mellitus. Examination Guidelines: A complete evaluation includes B-mode imaging, spectral doppler, color doppler, and power doppler as needed of all accessible portions of each vessel. Bilateral testing is considered an integral part of a complete examination. Limited examinations for reoccurring indications may be performed as noted.  ABI Findings:  +---------+------------------+-----+---------+--------+ Right    Rt Pressure (mmHg)IndexWaveform Comment  +---------+------------------+-----+---------+--------+ Brachial 151                                      +---------+------------------+-----+---------+--------+ CFA                             triphasic         +---------+------------------+-----+---------+--------+ Popliteal                       triphasic         +---------+------------------+-----+---------+--------+ ATA      148               0.95 triphasic         +---------+------------------+-----+---------+--------+ PTA      180               1.16 triphasic         +---------+------------------+-----+---------+--------+ PERO     147               0.95 triphasic         +---------+------------------+-----+---------+--------+ Great Toe143               0.92 Normal            +---------+------------------+-----+---------+--------+ +---------+------------------+-----+---------+-------+ Left     Lt Pressure (mmHg)IndexWaveform Comment +---------+------------------+-----+---------+-------+ Brachial 155                                     +---------+------------------+-----+---------+-------+ CFA  triphasic        +---------+------------------+-----+---------+-------+ Popliteal                       triphasic        +---------+------------------+-----+---------+-------+ ATA      151               0.97 triphasic        +---------+------------------+-----+---------+-------+ PTA      163               1.05 triphasic        +---------+------------------+-----+---------+-------+ PERO     160               1.03 triphasic        +---------+------------------+-----+---------+-------+ Great Toe141               0.91 Normal           +---------+------------------+-----+---------+-------+ +-------+-----------+-----------+------------+------------+  ABI/TBIToday's ABIToday's TBIPrevious ABIPrevious TBI +-------+-----------+-----------+------------+------------+ Right  1.16       0.92                                +-------+-----------+-----------+------------+------------+ Left   1.05       0.91                                +-------+-----------+-----------+------------+------------+ TOES Findings: +----------+---------------+--------+-------+ Right ToesPressure (mmHg)WaveformComment +----------+---------------+--------+-------+ 1st Digit                Normal          +----------+---------------+--------+-------+ 2nd Digit                Normal          +----------+---------------+--------+-------+ 3rd Digit                Normal          +----------+---------------+--------+-------+ 4th Digit                Normal          +----------+---------------+--------+-------+ 5th Digit                Normal          +----------+---------------+--------+-------+ +---------+---------------+--------+-------+ Left ToesPressure (mmHg)WaveformComment +---------+---------------+--------+-------+ 1st Digit               Normal          +---------+---------------+--------+-------+ 2nd Digit               Normal          +---------+---------------+--------+-------+ 3rd Digit               Normal          +---------+---------------+--------+-------+ 4th Digit               Normal          +---------+---------------+--------+-------+ 5th Digit               Normal          +---------+---------------+--------+-------+  Final Interpretation: Right: Resting right ankle-brachial index is within normal range. No evidence of significant right lower extremity arterial disease. The right toe-brachial index is normal. Left: Resting left ankle-brachial index is within normal range. No evidence of significant left lower extremity arterial disease. The left toe-brachial index is normal.  *See table(s) above for  measurements  and observations.  Electronically signed by Lance Muss on 04/27/2018 at 6:48:56 PM.      Assessment & Plan:  Plan  I have discontinued Kathrin Penner. Giles's lisinopril-hydrochlorothiazide, amoxicillin-clavulanate, methylPREDNISolone, gabapentin, and desvenlafaxine. I am also having her start on lisinopril-hydrochlorothiazide, gabapentin, FLUoxetine, and meclizine. Additionally, I am having her maintain her albuterol, Vitamin D (Ergocalciferol), ONETOUCH DELICA LANCETS FINE, glucose blood, cyanocobalamin, metFORMIN, traMADol, clonazePAM, lisdexamfetamine, meloxicam, and SYRINGE 3CC/25GX5/8".  Meds ordered this encounter  Medications  . lisinopril-hydrochlorothiazide (ZESTORETIC) 20-12.5 MG tablet    Sig: Take 2 tablets by mouth daily.    Dispense:  180 tablet    Refill:  3  . gabapentin (NEURONTIN) 600 MG tablet    Sig: Take 1 tablet (600 mg total) by mouth 3 (three) times daily.    Dispense:  90 tablet    Refill:  3  . FLUoxetine (PROZAC) 20 MG tablet    Sig: Take 1 tablet (20 mg total) by mouth daily.    Dispense:  90 tablet    Refill:  3  . meclizine (ANTIVERT) 25 MG tablet    Sig: Take 1 tablet (25 mg total) by mouth 3 (three) times daily as needed for dizziness.    Dispense:  30 tablet    Refill:  0    Problem List Items Addressed This Visit      Unprioritized   Attention deficit disorder (ADD) without hyperactivity    Stable  con't vyvanes uds today and contract       Relevant Orders   Pain Mgmt, Profile 8 w/Conf, U   Depression with anxiety    Severe Change to prozac  F/u 3 months or sooner prn       Relevant Medications   FLUoxetine (PROZAC) 20 MG tablet   DM (diabetes mellitus) type II uncontrolled, periph vascular disorder (HCC)    Check labs  con't bs check      Relevant Medications   lisinopril-hydrochlorothiazide (ZESTORETIC) 20-12.5 MG tablet   Other Relevant Orders   Hemoglobin A1c   Comprehensive metabolic panel   HTN  (hypertension)    Poorly controlled will alter medications, encouraged DASH diet, minimize caffeine and obtain adequate sleep. Report concerning symptoms and follow up as directed and as needed      Relevant Medications   lisinopril-hydrochlorothiazide (ZESTORETIC) 20-12.5 MG tablet   Other Relevant Orders   Comprehensive metabolic panel   Hyperglycemia - Primary   Relevant Orders   Hemoglobin A1c   Comprehensive metabolic panel   Hyperlipidemia    Encouraged heart healthy diet, increase exercise, avoid trans fats, consider a krill oil cap daily      Relevant Medications   lisinopril-hydrochlorothiazide (ZESTORETIC) 20-12.5 MG tablet   Hyperlipidemia associated with type 2 diabetes mellitus (HCC)    Encouraged heart healthy diet, increase exercise, avoid trans fats, consider a krill oil cap daily      Relevant Medications   lisinopril-hydrochlorothiazide (ZESTORETIC) 20-12.5 MG tablet   Other Relevant Orders   Comprehensive metabolic panel   Lipid panel   Hypothyroidism    Check labs      Relevant Orders   TSH   Morbid obesity (HCC)    Diet and exercise      Vitamin B12 deficiency    Check labs today       Other Visit Diagnoses    Vitamin D deficiency       Relevant Orders   VITAMIN D 25 Hydroxy (Vit-D Deficiency, Fractures)   Other chronic pain  Relevant Medications   gabapentin (NEURONTIN) 600 MG tablet   FLUoxetine (PROZAC) 20 MG tablet   Depression, major, single episode, moderate (HCC)       Relevant Medications   FLUoxetine (PROZAC) 20 MG tablet   B12 deficiency       Relevant Orders   Vitamin B12   Vertigo       Relevant Medications   meclizine (ANTIVERT) 25 MG tablet   Generalized anxiety disorder       Relevant Medications   FLUoxetine (PROZAC) 20 MG tablet   Other Relevant Orders   Pain Mgmt, Profile 8 w/Conf, U   High risk medication use       Relevant Orders   Pain Mgmt, Profile 8 w/Conf, U      Follow-up: Return in about 3 months  (around 03/15/2019), or if symptoms worsen or fail to improve, for hypertension, hyperlipidemia, diabetes II.  Donato Schultz, DO

## 2018-12-14 NOTE — Assessment & Plan Note (Signed)
Stable  con't vyvanes uds today and contract

## 2018-12-14 NOTE — Assessment & Plan Note (Signed)
Poorly controlled will alter medications, encouraged DASH diet, minimize caffeine and obtain adequate sleep. Report concerning symptoms and follow up as directed and as needed 

## 2018-12-14 NOTE — Assessment & Plan Note (Signed)
Diet and exercise.   

## 2018-12-14 NOTE — Assessment & Plan Note (Signed)
Check labs 

## 2018-12-14 NOTE — Assessment & Plan Note (Signed)
Severe Change to prozac  F/u 3 months or sooner prn

## 2018-12-14 NOTE — Assessment & Plan Note (Signed)
Encouraged heart healthy diet, increase exercise, avoid trans fats, consider a krill oil cap daily 

## 2018-12-14 NOTE — Patient Instructions (Addendum)
Call [facility] for counseling    How to Perform the Epley Maneuver The Epley maneuver is an exercise that relieves symptoms of vertigo. Vertigo is the feeling that you or your surroundings are moving when they are not. When you feel vertigo, you may feel like the room is spinning and have trouble walking. Dizziness is a little different than vertigo. When you are dizzy, you may feel unsteady or light-headed. You can do this maneuver at home whenever you have symptoms of vertigo. You can do it up to 3 times a day until your symptoms go away. Even though the Epley maneuver may relieve your vertigo for a few weeks, it is possible that your symptoms will return. This maneuver relieves vertigo, but it does not relieve dizziness. What are the risks? If it is done correctly, the Epley maneuver is considered safe. Sometimes it can lead to dizziness or nausea that goes away after a short time. If you develop other symptoms, such as changes in vision, weakness, or numbness, stop doing the maneuver and call your health care provider. How to perform the Epley maneuver 1. Sit on the edge of a bed or table with your back straight and your legs extended or hanging over the edge of the bed or table. 2. Turn your head halfway toward the affected ear or side. 3. Lie backward quickly with your head turned until you are lying flat on your back. You may want to position a pillow under your shoulders. 4. Hold this position for 30 seconds. You may experience an attack of vertigo. This is normal. 5. Turn your head to the opposite direction until your unaffected ear is facing the floor. 6. Hold this position for 30 seconds. You may experience an attack of vertigo. This is normal. Hold this position until the vertigo stops. 7. Turn your whole body to the same side as your head. Hold for another 30 seconds. 8. Sit back up. You can repeat this exercise up to 3 times a day. Follow these instructions at home:  After  doing the Epley maneuver, you can return to your normal activities.  Ask your health care provider if there is anything you should do at home to prevent vertigo. He or she may recommend that you: ? Keep your head raised (elevated) with two or more pillows while you sleep. ? Do not sleep on the side of your affected ear. ? Get up slowly from bed. ? Avoid sudden movements during the day. ? Avoid extreme head movement, like looking up or bending over. Contact a health care provider if:  Your vertigo gets worse.  You have other symptoms, including: ? Nausea. ? Vomiting. ? Headache. Get help right away if:  You have vision changes.  You have a severe or worsening headache or neck pain.  You cannot stop vomiting.  You have new numbness or weakness in any part of your body. Summary  Vertigo is the feeling that you or your surroundings are moving when they are not.  The Epley maneuver is an exercise that relieves symptoms of vertigo.  If the Epley maneuver is done correctly, it is considered safe. You can do it up to 3 times a day. This information is not intended to replace advice given to you by your health care provider. Make sure you discuss any questions you have with your health care provider. Document Released: 12/20/2013 Document Revised: 11/04/2016 Document Reviewed: 11/04/2016 Elsevier Interactive Patient Education  2017 [employer].   Tobacco  Use Disorder Tobacco use disorder (TUD) is a mental disorder. It is the long-term use of tobacco in spite of related health problems or difficulty with normal life activities. Tobacco is most commonly smoked as cigarettes and less commonly as cigars or pipes. Smokeless chewing tobacco and snuff are also popular. People with TUD get a feeling of extreme pleasure (euphoria) from using tobacco and have a desire to use it again and again. Repeated use of tobacco can cause problems. The addictive effects of tobacco are due mainly tothe  ingredient nicotine. Nicotine also causes a rush of adrenaline (epinephrine) in the body. This leads to increased blood pressure, heart rate, and breathing rate. These changes may cause problems for people with high blood pressure, weak hearts, or lung disease. High doses of nicotine in children and pets can lead to seizures and death. Tobacco contains a number of other unsafe chemicals. These chemicals are especially harmful when inhaled as smoke and can damage almost every organ in the body. Smokers live shorter lives than nonsmokers and are at risk of dying from a number of diseases and cancers. Tobacco smoke can also cause health problems for nonsmokers (due to inhaling secondhand smoke). Smoking is also a fire hazard. TUD usually starts in the late teenage years and is most common in young adults between the ages of 76 and 25 years. People who start smoking earlier in life are more likely to continue smoking as adults. TUD is somewhat more common in men than women. People with TUD are at higher risk for using alcohol and other drugs of abuse. What increases the risk? Risk factors for TUD include:  Having family members with the disorder.  Being around people who use tobacco.  Having an existing mental health issue such as schizophrenia, depression, bipolar disorder, ADHD, or posttraumatic stress disorder (PTSD).  What are the signs or symptoms? People with tobacco use disorder have two or more of the following signs and symptoms within 12 months:  Use of more tobacco over a longer period than intended.  Not able to cut down or control tobacco use.  A lot of time spent obtaining or using tobacco.  Strong desire or urge to use tobacco (craving). Cravings may last for 6 months or longer after quitting.  Use of tobacco even when use leads to major problems at work, school, or home.  Use of tobacco even when use leads to relationship problems.  Giving up or cutting down on important life  activities because of tobacco use.  Repeatedly using tobacco in situations where it puts you or others in physical danger, like smoking in bed.  Use of tobacco even when it is known that a physical or mental problem is likely related to tobacco use. ? Physical problems are numerous and may include chronic bronchitis, emphysema, lung and other cancers, gum disease, high blood pressure, heart disease, and stroke. ? Mental problems caused by tobacco may include difficulty sleeping and anxiety.  Need to use greater amounts of tobacco to get the same effect. This means you have developed a tolerance.  Withdrawal symptoms as a result of stopping or rapidly cutting back use. These symptoms may last a month or more after quitting and include the following: ? Depressed, anxious, or irritable mood. ? Difficulty concentrating. ? Increased appetite. ? Restlessness or trouble sleeping. ? Use of tobacco to avoid withdrawal symptoms.  How is this diagnosed? Tobacco use disorder is diagnosed by your health care provider. A diagnosis may be made by:  Your health care provider asking questions about your tobacco use and any problems it may be causing.  A physical exam.  Lab tests.  You may be referred to a mental health professional or addiction specialist.  The severity of tobacco use disorder depends on the number of signs and symptoms you have:  Mild-Two or three symptoms.  Moderate-Four or five symptoms.  Severe-Six or more symptoms.  How is this treated? Many people with tobacco use disorder are unable to quit on their own and need help. Treatment options include the following:  Nicotine replacement therapy (NRT). NRT provides nicotine without the other harmful chemicals in tobacco. NRT gradually lowers the dosage of nicotine in the body and reduces withdrawal symptoms. NRT is available in over-the-counter forms (gum, lozenges, and skin patches) as well as prescription forms (mouth inhaler  and nasal spray).  Medicines.This may include: ? Antidepressant medicine that may reduce nicotine cravings. ? A medicine that acts on nicotine receptors in the brain to reduce cravings and withdrawal symptoms. It may also block the effects of tobacco in people with TUD who relapse.  Counseling or talk therapy. A form of talk therapy called behavioral therapy is commonly used to treat people with TUD. Behavioral therapy looks at triggers for tobacco use, how to avoid them, and how to cope with cravings. It is most effective in person or by phone but is also available in self-help forms (books and Internet websites).  Support groups. These provide emotional support, advice, and guidance for quitting tobacco.  The most effective treatment for TUD is usually a combination of medicine, talk therapy, and support groups. Follow these instructions at home:  Keep all follow-up visits as directed by your health care provider. This is important.  Take medicines only as directed by your health care provider.  Check with your health care provider before starting new prescription or over-the-counter medicines. Contact a health care provider if:  You are not able to take your medicines as prescribed.  Treatment is not helping your TUD and your symptoms get worse. Get help right away if:  You have serious thoughts about hurting yourself or others.  You have trouble breathing, chest pain, sudden weakness, or sudden numbness in part of your body. This information is not intended to replace advice given to you by your health care provider. Make sure you discuss any questions you have with your health care provider. Document Released: 08/20/2004 Document Revised: 08/17/2016 Document Reviewed: 02/10/2014 Elsevier Interactive Patient Education  2018 [employer].   Persistent Depressive Disorder Persistent depressive disorder (PDD) is a mental health condition. PDD causes symptoms of low-level  depression for 2 years or longer. It may also be called long-term (chronic) depression or dysthymia. PDD may include episodes of more severe depression that last for about 2 weeks (major depressive disorder or MDD). PDD can affect the way you think, feel, and sleep. This condition may also affect your relationships. You may be more likely to get sick if you have PDD. Symptoms of PDD occur for most of the day and may include:  Feeling tired (fatigue).  Low energy.  Eating too much or too little.  Sleeping too much or too little.  Feeling restless or agitated.  Feeling hopeless.  Feeling worthless or guilty.  Feeling worried or nervous (anxiety).  Trouble concentrating or making decisions.  Low self-esteem.  A negative way of looking at things (outlook).  Not being able to have fun or feel pleasure.  Avoiding interacting with people.  Getting angry or annoyed easily (irritability).  Acting aggressive or angry.  Follow these instructions at home: Activity  Go back to your normal activities as told by your doctor.  Exercise regularly as told by your doctor. General instructions  Take over-the-counter and prescription medicines only as told by your doctor.  Do not drink alcohol. Or, limit how much alcohol you drink to no more than 1 drink a day for nonpregnant women and 2 drinks a day for men. One drink equals 12 oz of beer, 5 oz of wine, or 1 oz of hard liquor. Alcohol can affect any antidepressant medicines you are taking. Talk with your doctor about your alcohol use.  Eat a healthy diet and get plenty of sleep.  Find activities that you enjoy each day.  Consider joining a support group. Your doctor may be able to suggest a support group.  Keep all follow-up visits as told by your doctor. This is important. Where to find more information: [employer] Mental [employer]ness  www.nami.org  [employer] of Mental Health  http://www.maynard.net/www.nimh.nih.gov  National  Suicide Prevention Lifeline  81805770151-800-273-TALK 612-506-2879(1-972-545-3113). This is free, 24-hour help.  Contact a doctor if:  Your symptoms get worse.  You have new symptoms.  You have trouble sleeping or doing your daily activities. Get help right away if:  You self-harm.  You have serious thoughts about hurting yourself or others.  You see, hear, taste, smell, or feel things that are not there (hallucinate). This information is not intended to replace advice given to you by your health care provider. Make sure you discuss any questions you have with your health care provider. Document Released: 11/26/2015 Document Revised: 08/08/2016 Document Reviewed: 08/08/2016 Elsevier Interactive Patient Education  2017 [employer]

## 2018-12-14 NOTE — Assessment & Plan Note (Signed)
Check labs  con't bs check

## 2018-12-14 NOTE — Assessment & Plan Note (Signed)
Check labs today.

## 2018-12-15 ENCOUNTER — Encounter: Payer: Self-pay | Admitting: Family Medicine

## 2018-12-15 NOTE — Addendum Note (Signed)
Addended by: Thelma BargeICHARDSON, SHEKETIA D on: 12/15/2018 08:30 AM   Modules accepted: Orders

## 2018-12-16 ENCOUNTER — Other Ambulatory Visit: Payer: Self-pay | Admitting: Family Medicine

## 2018-12-16 MED ORDER — FLUOXETINE HCL 40 MG PO CAPS: ORAL_CAPSULE | ORAL | 3 refills | Status: DC

## 2018-12-16 NOTE — Telephone Encounter (Signed)
I have changed it to cap

## 2018-12-17 ENCOUNTER — Encounter: Payer: Self-pay | Admitting: *Deleted

## 2018-12-17 LAB — PAIN MGMT, PROFILE 8 W/CONF, U
6 ACETYLMORPHINE: NEGATIVE ng/mL (ref <10)
ALPHAHYDROXYMIDAZOLAM: NEGATIVE ng/mL (ref <50)
Alcohol Metabolites: POSITIVE ng/mL — AB (ref <500)
Alphahydroxyalprazolam: NEGATIVE ng/mL (ref <25)
Alphahydroxytriazolam: NEGATIVE ng/mL (ref <50)
Aminoclonazepam: 839 ng/mL — ABNORMAL HIGH (ref <25)
Amphetamine: >40000 ng/mL — ABNORMAL HIGH (ref <250)
Amphetamines: POSITIVE ng/mL — AB (ref <500)
BUPRENORPHINE, URINE: NEGATIVE ng/mL (ref <5)
Benzodiazepines: POSITIVE ng/mL — AB (ref <100)
Cocaine Metabolite: NEGATIVE ng/mL (ref <150)
Creatinine: 317.2 mg/dL
Ethyl Glucuronide (ETG): 849 ng/mL — ABNORMAL HIGH (ref <500)
Ethyl Sulfate (ETS): 275 ng/mL — ABNORMAL HIGH (ref <100)
Hydroxyethylflurazepam: NEGATIVE ng/mL (ref <50)
Lorazepam: NEGATIVE ng/mL (ref <50)
MDMA: NEGATIVE ng/mL (ref <500)
Marijuana Metabolite: NEGATIVE ng/mL (ref <20)
Methamphetamine: NEGATIVE ng/mL (ref <250)
Nordiazepam: NEGATIVE ng/mL (ref <50)
Opiates: NEGATIVE ng/mL (ref <100)
Oxazepam: NEGATIVE ng/mL (ref <50)
Oxidant: NEGATIVE ug/mL (ref <200)
Oxycodone: NEGATIVE ng/mL (ref <100)
Temazepam: NEGATIVE ng/mL (ref <50)
pH: 6.06 (ref 4.5–9.0)

## 2018-12-23 ENCOUNTER — Encounter: Payer: Self-pay | Admitting: Family Medicine

## 2018-12-23 ENCOUNTER — Other Ambulatory Visit: Payer: Self-pay | Admitting: Family Medicine

## 2018-12-23 ENCOUNTER — Other Ambulatory Visit: Payer: Self-pay | Admitting: *Deleted

## 2018-12-23 DIAGNOSIS — F411 Generalized anxiety disorder: Secondary | ICD-10-CM

## 2018-12-23 MED ORDER — ROSUVASTATIN CALCIUM 10 MG PO TABS: 10.0000 mg | ORAL_TABLET | Freq: Every day | ORAL | 2 refills | Status: DC

## 2018-12-23 MED ORDER — VITAMIN D (ERGOCALCIFEROL) 1.25 MG (50000 UNIT) PO CAPS: 50000.0000 [IU] | ORAL_CAPSULE | ORAL | 0 refills | Status: AC

## 2018-12-23 MED ORDER — METFORMIN HCL ER 500 MG PO TB24: 1000.0000 mg | ORAL_TABLET | Freq: Two times a day (BID) | ORAL | 1 refills | Status: DC

## 2018-12-24 ENCOUNTER — Other Ambulatory Visit: Payer: Self-pay | Admitting: Family Medicine

## 2018-12-24 DIAGNOSIS — F909 Attention-deficit hyperactivity disorder, unspecified type: Secondary | ICD-10-CM

## 2018-12-24 DIAGNOSIS — F411 Generalized anxiety disorder: Secondary | ICD-10-CM

## 2018-12-24 NOTE — Telephone Encounter (Signed)
Dr. Lowne just FYI. 

## 2018-12-24 NOTE — Telephone Encounter (Signed)
Database ran and is on your desk for review.  Last filled per database: 11/15/18 Last written: 11/15/18 Last ov: 12/14/18 Next ov: none Contract: 12/15/19 UDS: 06/15/19

## 2018-12-27 MED ORDER — LISDEXAMFETAMINE DIMESYLATE 70 MG PO CAPS: 70.0000 mg | ORAL_CAPSULE | Freq: Every day | ORAL | 0 refills | Status: DC

## 2018-12-27 NOTE — Telephone Encounter (Signed)
Last filled per database: 11/15/18 Last written: 11/15/18 Last ov: 12/14/18 Next ov: none Contract: 12/15/19 UDS: 06/15/19

## 2019-01-07 ENCOUNTER — Other Ambulatory Visit: Payer: Self-pay | Admitting: Family Medicine

## 2019-01-07 DIAGNOSIS — G8929 Other chronic pain: Secondary | ICD-10-CM

## 2019-01-08 ENCOUNTER — Encounter: Payer: Self-pay | Admitting: Family Medicine

## 2019-01-10 ENCOUNTER — Encounter: Payer: Self-pay | Admitting: Podiatry

## 2019-01-10 MED ORDER — FLUOXETINE HCL 20 MG PO TABS: 20.0000 mg | ORAL_TABLET | Freq: Every day | ORAL | 3 refills | Status: DC

## 2019-01-10 NOTE — Telephone Encounter (Signed)
It usually does the opposite but it s ok to lower to 20 mg daily #30 , 2 refills

## 2019-01-14 ENCOUNTER — Other Ambulatory Visit: Payer: Self-pay | Admitting: *Deleted

## 2019-01-14 MED ORDER — GLUCOSE BLOOD VI STRP: ORAL_STRIP | 1 refills | Status: AC

## 2019-01-19 ENCOUNTER — Ambulatory Visit: Payer: Self-pay

## 2019-01-19 MED ORDER — FLUOXETINE HCL 20 MG PO CAPS: 20.0000 mg | ORAL_CAPSULE | Freq: Every day | ORAL | 5 refills | Status: DC

## 2019-01-19 NOTE — Telephone Encounter (Signed)
Patient stated that she never picked up the 20mg  tabs.  She thinks her ins does not cover the tabs.  20mg  caps sent in.  She also had a question about her UDS coming up positive for alcohol.  She was not sure why and she does not drink alcohol.  Advised patient that we will recheck in 6 months.

## 2019-01-19 NOTE — Addendum Note (Signed)
Addended by: Thelma Barge D on: 01/19/2019 04:02 PM   Modules accepted: Orders

## 2019-01-19 NOTE — Telephone Encounter (Signed)
Patient needs a new Rx written for Proac.  ( Fluoxetine ).  Patient request  A new Rx be written for for  10 mg capsules. Patients states that insurance wont pay for tablets.  And 20mg  are to strong for patient.  Patient also states that she recently had a liver test and urine test.  Came back positive for alcohol.  Pt.  Voices concern . States that she does not drink .  Would like a return call from Dr.  Zola Button.    Answer Assessment - Initial Assessment Questions 1. SYMPTOMS: "Do you have any symptoms?"                       Patient needs a  New Rx written. 2. SEVERITY: If symptoms are present, ask "Are they mild, moderate or severe?"     *No Answer*  Protocols used: MEDICATION QUESTION CALL-A-AH

## 2019-01-20 ENCOUNTER — Telehealth: Payer: Self-pay | Admitting: Family Medicine

## 2019-01-20 MED ORDER — FLUOXETINE HCL 20 MG PO CAPS: 20.0000 mg | ORAL_CAPSULE | Freq: Every day | ORAL | 5 refills | Status: DC

## 2019-01-20 NOTE — Telephone Encounter (Signed)
Patient notified that it was our error.  rx did not go anywhere yesterday because it was set to phone in.  Rx resent.

## 2019-01-20 NOTE — Telephone Encounter (Signed)
Copied from CRM [id number]. Topic: Quick Communication - See Telephone Encounter >> Jan 20, 2019  8:11 AM Windy KalataMichael, Carmalita Wakefield L, NT wrote: CRM for notification. See Telephone encounter for: 01/20/19.  Patient is calling in regards to FLUoxetine (PROZAC) 20 MG capsule. She states that she called the pharmacy this morning and it is showing inactive. She is unsure what to do at this point. Please advise.

## 2019-01-22 ENCOUNTER — Other Ambulatory Visit: Payer: Self-pay | Admitting: Family Medicine

## 2019-01-22 DIAGNOSIS — E538 Deficiency of other specified B group vitamins: Secondary | ICD-10-CM

## 2019-01-27 ENCOUNTER — Other Ambulatory Visit: Payer: Self-pay | Admitting: Family Medicine

## 2019-01-27 DIAGNOSIS — F909 Attention-deficit hyperactivity disorder, unspecified type: Secondary | ICD-10-CM

## 2019-01-27 NOTE — Telephone Encounter (Signed)
Requested medication (s) are due for refill today: yes  Requested medication (s) are on the active medication list: yes Last refill:  12/27/18   Future visit scheduled:no  Notes to clinic:  Needs appointment     Requested Prescriptions  Pending Prescriptions Disp Refills   lisdexamfetamine (VYVANSE) 70 MG capsule 30 capsule 0    Sig: Take 1 capsule (70 mg total) by mouth daily.     Not Delegated - Psychiatry:  Stimulants/ADHD Failed - 01/27/2019  8:43 AM      Failed - This refill cannot be delegated      Failed - Urine Drug Screen completed in last 360 days.      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, [place], DO   2 months ago Tooth infection   Holiday representative at [facility], [place], [place]   1 year ago Hyperlipidemia associated with type 2 diabetes mellitus (HCC)   Holiday representative at [facility] [employer], [place] R, DO   1 year ago Menopause   Holiday representative at [facility] [place], [place] R, DO   1 year ago Attention deficit disorder (ADD) without hyperactivity   Holiday representative at [facility] [place], [place], [place]

## 2019-01-27 NOTE — Telephone Encounter (Signed)
Copied from CRM [id number]. Topic: Quick Communication - Rx Refill/Question >> Jan 27, 2019  8:33 AM Lynne Logan D wrote: Medication: lisdexamfetamine (VYVANSE) 70 MG capsule / Pharmacy instructed pt to contact PCP for refill. Pt will be out after today.  Has the patient contacted their pharmacy? Yes.   (Agent: If no, request that the patient contact the pharmacy for the refill.) (Agent: If yes, when and what did the pharmacy advise?)  Preferred Pharmacy (with phone number or street name): [id number] - [place], [place] - [address] [phone] (Phone) [phone] (Fax)  Agent: Please be advised that RX refills may take up to 3 business days. We ask that you follow-up with your pharmacy.

## 2019-01-28 MED ORDER — LISDEXAMFETAMINE DIMESYLATE 70 MG PO CAPS: 70.0000 mg | ORAL_CAPSULE | Freq: Every day | ORAL | 0 refills | Status: DC

## 2019-01-28 NOTE — Telephone Encounter (Signed)
Database ran on 12/24/18 and is in media for review  Last filled per database: 12/27/18 Last written: 12/27/18 Last ov: 12/14/18 Next ov: none Contract: 12/15/19 UDS: 06/15/19

## 2019-02-17 ENCOUNTER — Other Ambulatory Visit: Payer: Self-pay | Admitting: *Deleted

## 2019-02-17 MED ORDER — FLUOXETINE HCL 20 MG PO CAPS: 20.0000 mg | ORAL_CAPSULE | Freq: Every day | ORAL | 1 refills | Status: DC

## 2019-02-28 ENCOUNTER — Other Ambulatory Visit: Payer: Self-pay | Admitting: Family Medicine

## 2019-02-28 ENCOUNTER — Other Ambulatory Visit: Payer: Self-pay | Admitting: Podiatry

## 2019-02-28 DIAGNOSIS — F909 Attention-deficit hyperactivity disorder, unspecified type: Secondary | ICD-10-CM

## 2019-02-28 DIAGNOSIS — F411 Generalized anxiety disorder: Secondary | ICD-10-CM

## 2019-02-28 MED ORDER — LISDEXAMFETAMINE DIMESYLATE 70 MG PO CAPS: 70.0000 mg | ORAL_CAPSULE | Freq: Every day | ORAL | 0 refills | Status: DC

## 2019-02-28 NOTE — Telephone Encounter (Signed)
Copied from CRM [id number]. Topic: Quick Communication - Rx Refill/Question >> Feb 28, 2019  8:52 AM Jens Som A wrote: Medication: lisdexamfetamine (VYVANSE) 70 MG capsule [[id number]]   Has the patient contacted their pharmacy? Yes  (Agent: If no, request that the patient contact the pharmacy for the refill.) (Agent: If yes, when and what did the pharmacy advise?)  Preferred Pharmacy (with phone number or street name):   Agent: Please be advised that RX refills may take up to 3 business days. We ask that you follow-up with your pharmacy.

## 2019-02-28 NOTE — Telephone Encounter (Signed)
Last written: 01/28/19 Last ov: 12/14/18 Next ov: none Contract: 12/14/18 UDS: 06/15/19

## 2019-02-28 NOTE — Telephone Encounter (Signed)
Last written: 12/24/2018 Last ov: 12/14/18 Next ov: none Contract: 12/14/18 UDS: 06/15/19

## 2019-02-28 NOTE — Telephone Encounter (Signed)
Vyvanse refill request  Pt of Dr. Zola Button

## 2019-03-04 ENCOUNTER — Other Ambulatory Visit: Payer: Self-pay | Admitting: Family Medicine

## 2019-03-10 ENCOUNTER — Other Ambulatory Visit: Payer: Self-pay

## 2019-03-13 ENCOUNTER — Other Ambulatory Visit: Payer: Self-pay | Admitting: Family Medicine

## 2019-03-13 ENCOUNTER — Other Ambulatory Visit: Payer: Self-pay | Admitting: Podiatry

## 2019-03-25 ENCOUNTER — Other Ambulatory Visit: Payer: Self-pay | Admitting: Family Medicine

## 2019-03-25 DIAGNOSIS — F909 Attention-deficit hyperactivity disorder, unspecified type: Secondary | ICD-10-CM

## 2019-03-25 DIAGNOSIS — F411 Generalized anxiety disorder: Secondary | ICD-10-CM

## 2019-03-25 MED ORDER — LISDEXAMFETAMINE DIMESYLATE 70 MG PO CAPS: 70.0000 mg | ORAL_CAPSULE | Freq: Every day | ORAL | 0 refills | Status: DC

## 2019-03-25 NOTE — Telephone Encounter (Signed)
Requesting: Klonopin Contract: Yes UDS: Yes, low risk, next screen 06/15/2019 Last OV: 12/14/2018 Next OV: N/A Last Refill: 02/28/2019, #90--0 RF Database:   Please advise

## 2019-03-25 NOTE — Telephone Encounter (Signed)
Pt is requesting refill on Vyvanse.   Last OV: 12/14/2018 Last Fill: 02/28/2019 #30 and 0RF UDS: 12/14/2018 Low risk

## 2019-03-25 NOTE — Telephone Encounter (Signed)
Copied from CRM [id number]. Topic: Quick Communication - Rx Refill/Question >> Mar 25, 2019 12:35 PM Mcneil, Ja-Kwan wrote: Medication:  lisdexamfetamine (VYVANSE) 70 MG capsule   Has the patient contacted their pharmacy? no  Preferred Pharmacy (with phone number or street name): [id number] [place], [place] - [address] [phone] (Phone)  [phone] (Fax)  Agent: Please be advised that RX refills may take up to 3 business days. We ask that you follow-up with your pharmacy.

## 2019-04-09 ENCOUNTER — Other Ambulatory Visit: Payer: Self-pay | Admitting: Podiatry

## 2019-04-27 ENCOUNTER — Other Ambulatory Visit: Payer: Self-pay | Admitting: *Deleted

## 2019-04-27 MED ORDER — "SYRINGE/NEEDLE (DISP) 24G X 1"" 3 ML MISC": 1 refills | Status: DC

## 2019-05-04 ENCOUNTER — Other Ambulatory Visit: Payer: Self-pay | Admitting: Podiatry

## 2019-05-06 ENCOUNTER — Telehealth: Payer: Self-pay

## 2019-05-06 ENCOUNTER — Other Ambulatory Visit: Payer: Self-pay | Admitting: Family Medicine

## 2019-05-06 DIAGNOSIS — F909 Attention-deficit hyperactivity disorder, unspecified type: Secondary | ICD-10-CM

## 2019-05-06 MED ORDER — LISDEXAMFETAMINE DIMESYLATE 70 MG PO CAPS: 70.0000 mg | ORAL_CAPSULE | Freq: Every day | ORAL | 0 refills | Status: DC

## 2019-05-06 NOTE — Telephone Encounter (Signed)
Sent in

## 2019-05-06 NOTE — Telephone Encounter (Signed)
Copied from CRM [id number]. Topic: Quick Communication - Rx Refill/Question >> May 06, 2019 10:22 AM Floria Raveling A wrote: Medication: lisdexamfetamine (VYVANSE) 70 MG capsule [[id number]]  Has the patient contacted their pharmacy? No  (Agent: If no, request that the patient contact the pharmacy for the refill.) (Agent: If yes, when and what did the pharmacy advise?)  Preferred Pharmacy (with phone number or street name): [id number] [place], [place] - [address] [phone] (Phone)   Agent: Please be advised that RX refills may take up to 3 business days. We ask that you follow-up with your pharmacy.

## 2019-05-06 NOTE — Telephone Encounter (Signed)
Last written: 03/25/19 Last ov: 12/14/18 Next ov:  none Contract: 12/15/19 UDS: 06/15/19

## 2019-05-09 ENCOUNTER — Other Ambulatory Visit: Payer: Self-pay | Admitting: Family Medicine

## 2019-05-09 DIAGNOSIS — E538 Deficiency of other specified B group vitamins: Secondary | ICD-10-CM

## 2019-05-09 NOTE — Telephone Encounter (Signed)
Left message on machine that medication has been sent in. °

## 2019-05-25 ENCOUNTER — Other Ambulatory Visit: Payer: Self-pay | Admitting: Family Medicine

## 2019-05-25 DIAGNOSIS — F411 Generalized anxiety disorder: Secondary | ICD-10-CM

## 2019-05-25 NOTE — Telephone Encounter (Signed)
Requesting: Klonopin Contract: 12/14/2018 UDS: 12/14/2018, low risk, next screen 06/15/2019 Last OV: 12/14/2018 Next OV: N/A Last Refill: 03/25/2019, #90--0 RF Database:   Please advise

## 2019-06-04 ENCOUNTER — Other Ambulatory Visit: Payer: Self-pay | Admitting: Family Medicine

## 2019-06-09 ENCOUNTER — Telehealth: Payer: Self-pay | Admitting: Family Medicine

## 2019-06-09 ENCOUNTER — Telehealth: Payer: Self-pay | Admitting: *Deleted

## 2019-06-09 NOTE — Telephone Encounter (Signed)
June and July prescriptions already at pharmacy.

## 2019-06-09 NOTE — Telephone Encounter (Signed)
Copied from [place] [id number]. Topic: General - Other >> Jun 09, 2019 11:31 AM Nils Flack wrote: Reason for CRM: pt needs letter to be excused from jury summons.  She has past bad experiences and panic attacks. It is for Monday July 6.   Please call [phone]  Leave a message if no answer

## 2019-06-09 NOTE — Telephone Encounter (Signed)
Copied from [place] [id number]. Topic: Quick Communication - Rx Refill/Question >> Jun 09, 2019 11:37 AM Nathanial Millman J wrote: Medication:  lisdexamfetamine (VYVANSE) 70 MG capsule Has the patient contacted their pharmacy? No. (Agent: If no, request that the patient contact the pharmacy for the refill.) (Agent: If yes, when and what did the pharmacy advise?)  [facility] phone number [facility] [place]  Pt knows this is early, wanted to call now as to not bother [place] later on   Agent: Please be advised that RX refills may take up to 3 business days. We ask that you follow-up with your pharmacy.

## 2019-06-13 NOTE — Telephone Encounter (Signed)
° °  Pt call back with juror number [phone]   Date 07/04/2019

## 2019-06-13 NOTE — Telephone Encounter (Signed)
Left message on machine to call back  Need juror number or copy of letter.

## 2019-06-20 ENCOUNTER — Encounter: Payer: Self-pay | Admitting: Family Medicine

## 2019-06-20 NOTE — Telephone Encounter (Signed)
Spoke w/ Pt-informed letter is released to [place]- instructed to call if she has any concerns or problems accessing it. Pt verbalized understanding.

## 2019-06-20 NOTE — Telephone Encounter (Signed)
Patient called to say that she need that letter ASAP because it need to be in to the courthouse ten days before the date. 07/04/2019. Please advise [phone]

## 2019-06-20 NOTE — Telephone Encounter (Signed)
Letter is in mychart.

## 2019-07-01 ENCOUNTER — Other Ambulatory Visit: Payer: Self-pay | Admitting: Family Medicine

## 2019-07-01 DIAGNOSIS — F411 Generalized anxiety disorder: Secondary | ICD-10-CM

## 2019-07-03 ENCOUNTER — Other Ambulatory Visit: Payer: Self-pay | Admitting: Family Medicine

## 2019-07-04 NOTE — Telephone Encounter (Signed)
Requesting: Clonazepam Contract: 12/14/2018 UDS: 12/14/2018, low risk, next screen 06/15/2019 Last OV: 12/14/2018 Next OV: N/A Last Refill: 05/25/2019, #90-0 RF Database:   Please advise

## 2019-07-27 ENCOUNTER — Other Ambulatory Visit: Payer: Self-pay | Admitting: Family Medicine

## 2019-08-01 ENCOUNTER — Other Ambulatory Visit: Payer: Self-pay | Admitting: Family Medicine

## 2019-08-01 DIAGNOSIS — E538 Deficiency of other specified B group vitamins: Secondary | ICD-10-CM

## 2019-08-02 ENCOUNTER — Other Ambulatory Visit: Payer: Self-pay | Admitting: Podiatry

## 2019-08-11 ENCOUNTER — Other Ambulatory Visit: Payer: Self-pay | Admitting: Family Medicine

## 2019-08-11 DIAGNOSIS — F411 Generalized anxiety disorder: Secondary | ICD-10-CM

## 2019-08-12 NOTE — Telephone Encounter (Signed)
Requesting: Klonopin Contract: 12/14/2018 UDS: 12/14/2018, low risk, next screeniing 06/15/2019 Last OV: 12/14/2018 Next OV: N/A Last Refill: 07/04/2019, #90--0 RF Database:   Please advise

## 2019-08-13 ENCOUNTER — Other Ambulatory Visit: Payer: Self-pay | Admitting: Family Medicine

## 2019-08-16 NOTE — Telephone Encounter (Signed)
LM for pt to call and schedule OV within the next 30 days.

## 2019-08-26 ENCOUNTER — Other Ambulatory Visit: Payer: Self-pay | Admitting: Family Medicine

## 2019-08-26 DIAGNOSIS — F411 Generalized anxiety disorder: Secondary | ICD-10-CM

## 2019-08-26 DIAGNOSIS — F909 Attention-deficit hyperactivity disorder, unspecified type: Secondary | ICD-10-CM

## 2019-08-26 NOTE — Telephone Encounter (Signed)
Requested medication (s) are due for refill today: yes  Requested medication (s) are on the active medication list: yes  Last refill: 04/2019  Future visit scheduled: no  Notes to clinic: This refill cannot be delegated   Requested Prescriptions  Pending Prescriptions Disp Refills   lisdexamfetamine (VYVANSE) 70 MG capsule 30 capsule 0    Sig: Take 1 capsule (70 mg total) by mouth daily.     Not Delegated - Psychiatry:  Stimulants/ADHD Failed - 08/26/2019  2:36 PM      Failed - This refill cannot be delegated      Failed - Valid encounter within last 3 months    Recent Outpatient Visits          8 months ago Hyperglycemia   [facility] at [place], [place]   9 months ago Tooth infection   Archivist at [place], PA-C   1 year ago Hyperlipidemia associated with type 2 diabetes mellitus ([place])   Archivist at [place], DO   2 years ago [place] at [place], DO   2 years ago Attention deficit disorder (ADD) without hyperactivity   [facility] at [place] completed in last 360 days.

## 2019-08-26 NOTE — Telephone Encounter (Signed)
Copied from [place] [id number]. Topic: Quick Communication - Rx Refill/Question >> Aug 26, 2019  2:22 PM Leward Quan A wrote: Medication: lisdexamfetamine (VYVANSE) 70 MG capsule   Has the patient contacted their pharmacy? Yes.   (Agent: If no, request that the patient contact the pharmacy for the refill.) (Agent: If yes, when and what did the pharmacy advise?)  Preferred Pharmacy (with phone number or street name): [id number] [place], [place] [phone] (Phone) [phone] (Fax)    Agent: Please be advised that RX refills may take up to 3 business days. We ask that you follow-up with your pharmacy.

## 2019-08-27 ENCOUNTER — Other Ambulatory Visit: Payer: Self-pay | Admitting: Family Medicine

## 2019-08-27 DIAGNOSIS — E538 Deficiency of other specified B group vitamins: Secondary | ICD-10-CM

## 2019-08-29 ENCOUNTER — Telehealth: Payer: Self-pay | Admitting: *Deleted

## 2019-08-29 NOTE — Telephone Encounter (Signed)
Pt can not take vyvanse, ,klonopin and hydrocodone all together She is getting hydrocodone from someone else

## 2019-08-29 NOTE — Telephone Encounter (Signed)
Copied from [place] [id number]. Topic: General - Other >> Aug 26, 2019  2:14 PM Leward Quan A wrote: Reason for CRM: Patient called to say that she had surgery on her foot and cannot drive until the doctor release her and does not want her to put any pressure or anything on the foot for the time. She is asking if the refills on her medications can be sent to the pharmacy [facility] She states that she does not know when she will be able to come in for her blood work. Please advise   Ph#  [phone]

## 2019-08-29 NOTE — Telephone Encounter (Signed)
Last written: 08/12/19 Last ov: 12/14/18 Next ov: none Contract: 12/15/19 UDS: due

## 2019-08-30 ENCOUNTER — Other Ambulatory Visit: Payer: Self-pay | Admitting: Family Medicine

## 2019-08-30 DIAGNOSIS — F909 Attention-deficit hyperactivity disorder, unspecified type: Secondary | ICD-10-CM

## 2019-08-30 MED ORDER — LISDEXAMFETAMINE DIMESYLATE 70 MG PO CAPS: 70.0000 mg | ORAL_CAPSULE | Freq: Every day | ORAL | 0 refills | Status: DC

## 2019-08-30 NOTE — Telephone Encounter (Signed)
Sent in 1 month

## 2019-08-30 NOTE — Telephone Encounter (Signed)
Patient notified that rx was sent in  

## 2019-08-30 NOTE — Telephone Encounter (Signed)
Patient is scheduled for virtual on Friday.  She needs a refill on Vyvanse.  She is completely out.  Last written: 05/06/19 Last ov: 12/14/18 Next ov: 09/02/19 Contract: 12/15/19 UDS: due

## 2019-08-31 NOTE — Telephone Encounter (Signed)
She just had foot surgery.

## 2019-09-01 ENCOUNTER — Other Ambulatory Visit: Payer: Self-pay | Admitting: Family Medicine

## 2019-09-02 ENCOUNTER — Encounter: Payer: Self-pay | Admitting: Family Medicine

## 2019-09-02 ENCOUNTER — Ambulatory Visit (INDEPENDENT_AMBULATORY_CARE_PROVIDER_SITE_OTHER): Payer: 59 | Admitting: Family Medicine

## 2019-09-02 ENCOUNTER — Other Ambulatory Visit: Payer: Self-pay

## 2019-09-02 DIAGNOSIS — E039 Hypothyroidism, unspecified: Secondary | ICD-10-CM

## 2019-09-02 DIAGNOSIS — I10 Essential (primary) hypertension: Secondary | ICD-10-CM | POA: Diagnosis not present

## 2019-09-02 DIAGNOSIS — IMO0002 Reserved for concepts with insufficient information to code with codable children: Secondary | ICD-10-CM

## 2019-09-02 DIAGNOSIS — E1169 Type 2 diabetes mellitus with other specified complication: Secondary | ICD-10-CM

## 2019-09-02 DIAGNOSIS — G8929 Other chronic pain: Secondary | ICD-10-CM

## 2019-09-02 DIAGNOSIS — E1151 Type 2 diabetes mellitus with diabetic peripheral angiopathy without gangrene: Secondary | ICD-10-CM

## 2019-09-02 DIAGNOSIS — F988 Other specified behavioral and emotional disorders with onset usually occurring in childhood and adolescence: Secondary | ICD-10-CM

## 2019-09-02 DIAGNOSIS — E1165 Type 2 diabetes mellitus with hyperglycemia: Secondary | ICD-10-CM

## 2019-09-02 DIAGNOSIS — E785 Hyperlipidemia, unspecified: Secondary | ICD-10-CM

## 2019-09-02 MED ORDER — GABAPENTIN 600 MG PO TABS: 600.0000 mg | ORAL_TABLET | Freq: Three times a day (TID) | ORAL | 3 refills | Status: DC

## 2019-09-02 NOTE — Assessment & Plan Note (Signed)
Tolerating statin, encouraged heart healthy diet, avoid trans fats, minimize simple carbs and saturated fats. Increase exercise as tolerated 

## 2019-09-02 NOTE — Assessment & Plan Note (Signed)
con't meds Stable  No side effects from meds

## 2019-09-02 NOTE — Assessment & Plan Note (Signed)
Check labs con't meds 

## 2019-09-02 NOTE — Assessment & Plan Note (Signed)
Well controlled, no changes to meds. Encouraged heart healthy diet such as the DASH diet and exercise as tolerated.  °

## 2019-09-02 NOTE — Progress Notes (Signed)
Virtual Visit via Video Note  I connected with [name] on 09/02/19 at  4:00 PM EDT by a video enabled telemedicine application and verified that I am speaking with the correct person using two identifiers.  Location: Patient: home  Provider: office    I discussed the limitations of evaluation and management by telemedicine and the availability of in person appointments. The patient expressed understanding and agreed to proceed.  History of Present Illness: Pt is home recovering from foot surgery  She has no complaints and only needs a refill on her neurontin   HYPERTENSION   Blood pressure range-good per pt   Chest pain- no      Dyspnea- no Lightheadedness- no   Edema- no  Other side effects - no    Medication compliance: good Low salt diet- yes     DIABETES    Blood Sugar ranges-140-147   Polyuria- no New Visual problems- no  Hypoglycemic symptoms- no  Other side effects-no Medication compliance - good  Last eye exam- due Foot exam- had surgery    HYPERLIPIDEMIA  Medication compliance- good RUQ pain- no  Muscle aches- no Other side effects-no       Observations/Objective: bs 147   149/87  Afebrile  Pt is in NAd   Assessment and Plan: 1. Hyperlipidemia associated with type 2 diabetes mellitus ([place]) Tolerating statin, encouraged heart healthy diet, avoid trans fats, minimize simple carbs and saturated fats. Increase exercise as tolerated - Lipid panel; Future - Hemoglobin A1c; Future - Comprehensive metabolic panel; Future  2. Uncontrolled type 2 diabetes mellitus with hyperglycemia ([place]) hgba1c to be checked , minimize simple carbs. Increase exercise as tolerated. Continue current meds  - Lipid panel; Future - Hemoglobin A1c; Future - Comprehensive metabolic panel; Future  3. Essential hypertension Well controlled, no changes to meds. Encouraged heart healthy diet such as the DASH diet and exercise as tolerated.  - Lipid panel; Future - Hemoglobin  A1c; Future - Comprehensive metabolic panel; Future  4. Other chronic pain  - gabapentin (NEURONTIN) 600 MG tablet; Take 1 tablet (600 mg total) by mouth 3 (three) times daily.  Dispense: 270 tablet; Refill: 3  5. Attention deficit disorder (ADD) without hyperactivity Stable  Does not need meds refilled   6. DM (diabetes mellitus) type II uncontrolled, periph vascular disorder ([place]) hgba1c to be checked , minimize simple carbs. Increase exercise as tolerated. Continue current meds   7. Hyperlipidemia, unspecified hyperlipidemia type Tolerating statin, encouraged heart healthy diet, avoid trans fats, minimize simple carbs and saturated fats. Increase exercise as tolerated  8. Hypothyroidism, unspecified type Check labs    Follow Up Instructions:    I discussed the assessment and treatment plan with the patient. The patient was provided an opportunity to ask questions and all were answered. The patient agreed with the plan and demonstrated an understanding of the instructions.   The patient was advised to call back or seek an in-person evaluation if the symptoms worsen or if the condition fails to improve as anticipated.  I provided 25 minutes of non-face-to-face time during this encounter.   Ann Held, DO

## 2019-09-02 NOTE — Assessment & Plan Note (Signed)
Stable per pt Pt will come in for labs

## 2019-09-22 ENCOUNTER — Other Ambulatory Visit: Payer: Self-pay | Admitting: Family Medicine

## 2019-09-22 DIAGNOSIS — E538 Deficiency of other specified B group vitamins: Secondary | ICD-10-CM

## 2019-09-26 ENCOUNTER — Ambulatory Visit: Payer: 59

## 2019-09-27 ENCOUNTER — Other Ambulatory Visit: Payer: Self-pay | Admitting: Family Medicine

## 2019-09-27 DIAGNOSIS — Z79899 Other long term (current) drug therapy: Secondary | ICD-10-CM

## 2019-09-27 DIAGNOSIS — F909 Attention-deficit hyperactivity disorder, unspecified type: Secondary | ICD-10-CM

## 2019-09-27 MED ORDER — LISDEXAMFETAMINE DIMESYLATE 70 MG PO CAPS: 70.0000 mg | ORAL_CAPSULE | Freq: Every day | ORAL | 0 refills | Status: DC

## 2019-09-27 NOTE — Telephone Encounter (Signed)
Requested medication (s) are due for refill today: yes  Requested medication (s) are on the active medication list: yes  Last refill:  08/30/2019  Future visit scheduled: no  Notes to clinic:  Refill cannot be delegated    Requested Prescriptions  Pending Prescriptions Disp Refills   lisdexamfetamine (VYVANSE) 70 MG capsule 30 capsule 0    Sig: Take 1 capsule (70 mg total) by mouth daily.     Not Delegated - Psychiatry:  Stimulants/ADHD Failed - 09/27/2019 10:03 AM      Failed - This refill cannot be delegated      Passed - Urine Drug Screen completed in last 360 days.      Passed - Valid encounter within last 3 months    Recent Outpatient Visits          3 weeks ago Hyperlipidemia associated with type 2 diabetes mellitus ([place])   Archivist at [place], [place]   9 months ago Hyperglycemia   Archivist at [place], DO   10 months ago Tooth infection   Archivist at [place], PA-C   1 year ago Hyperlipidemia associated with type 2 diabetes mellitus ([place])   Archivist at [place], DO   2 years ago [place] [facility] [place], [place]

## 2019-09-27 NOTE — Telephone Encounter (Signed)
Last Vyvanse RX: 08/30/19, #30 Last OV: 09/02/19 Next OV:  None scheduled UDS: 12/14/18, past due. CSC: 12/14/18  Pt is due for lab appt and has not completed that yet, if you would like to order a future UDS we could collect that when she comes in for her labs.

## 2019-09-27 NOTE — Telephone Encounter (Signed)
Medication Refill - Medication: lisdexamfetamine (VYVANSE) 70 MG capsule    Has the patient contacted their pharmacy? No. (Agent: If no, request that the patient contact the pharmacy for the refill.) (Agent: If yes, when and what did the pharmacy advise?)  Preferred Pharmacy (with phone number or street name):  [id number] [place], [place] [phone] (Phone) [phone] (Fax)     Agent: Please be advised that RX refills may take up to 3 business days. We ask that you follow-up with your pharmacy.

## 2019-09-28 ENCOUNTER — Other Ambulatory Visit: Payer: Self-pay | Admitting: Podiatry

## 2019-09-28 NOTE — Addendum Note (Signed)
Addended by: Kelle Darting A on: 09/28/2019 08:32 AM   Modules accepted: Orders

## 2019-10-08 ENCOUNTER — Other Ambulatory Visit: Payer: Self-pay | Admitting: Family Medicine

## 2019-10-26 ENCOUNTER — Other Ambulatory Visit: Payer: Self-pay | Admitting: Family Medicine

## 2019-10-26 ENCOUNTER — Telehealth: Payer: Self-pay | Admitting: Family Medicine

## 2019-10-26 DIAGNOSIS — F909 Attention-deficit hyperactivity disorder, unspecified type: Secondary | ICD-10-CM

## 2019-10-26 DIAGNOSIS — F411 Generalized anxiety disorder: Secondary | ICD-10-CM

## 2019-10-26 NOTE — Telephone Encounter (Signed)
lisdexamfetamine (VYVANSE) 70 MG capsule   [facility]

## 2019-10-26 NOTE — Telephone Encounter (Signed)
Requested medication (s) are due for refill today: yes  Requested medication (s) are on the active medication list: yes  Last refill:  09/27/2019  Future visit scheduled: no  Notes to clinic:  Refill cannot be delegated   Requested Prescriptions  Pending Prescriptions Disp Refills   lisdexamfetamine (VYVANSE) 70 MG capsule 30 capsule 0    Sig: Take 1 capsule (70 mg total) by mouth daily.     Not Delegated - Psychiatry:  Stimulants/ADHD Failed - 10/26/2019  1:06 PM      Failed - This refill cannot be delegated      Passed - Urine Drug Screen completed in last 360 days.      Passed - Valid encounter within last 3 months    Recent Outpatient Visits          1 month ago Hyperlipidemia associated with type 2 diabetes mellitus ([place])   Archivist at [place], DO   10 months ago Hyperglycemia   Archivist at [place], [place]   11 months ago Tooth infection   Archivist at [place], PA-C   1 year ago Hyperlipidemia associated with type 2 diabetes mellitus ([place])   Archivist at [place], DO   2 years ago [facility] [place], [place]

## 2019-10-27 MED ORDER — LISDEXAMFETAMINE DIMESYLATE 70 MG PO CAPS: 70.0000 mg | ORAL_CAPSULE | Freq: Every day | ORAL | 0 refills | Status: DC

## 2019-10-27 NOTE — Telephone Encounter (Signed)
RF request for Vyvanse LOV: 09/02/2019 Next ov: not scheduled Last written: 09/27/2019  Please advise, thank you

## 2019-10-27 NOTE — Telephone Encounter (Signed)
Requesting: Klonopin  Contract: 12/14/2018 UDS: 12/14/2018, low risk, 06/15/2019 Last OV:  09/02/2019 Next OV: N/A Last Refill: 08/29/2019, #90--0 RF Database:   Please advise

## 2019-10-28 ENCOUNTER — Other Ambulatory Visit: Payer: Self-pay | Admitting: Family Medicine

## 2019-10-28 DIAGNOSIS — F411 Generalized anxiety disorder: Secondary | ICD-10-CM

## 2019-10-28 DIAGNOSIS — F909 Attention-deficit hyperactivity disorder, unspecified type: Secondary | ICD-10-CM

## 2019-10-28 MED ORDER — CLONAZEPAM 1 MG PO TABS: 1.0000 mg | ORAL_TABLET | Freq: Three times a day (TID) | ORAL | 0 refills | Status: DC | PRN

## 2019-10-28 NOTE — Telephone Encounter (Signed)
Could you please resent controlled rx
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
## 2019-11-01 ENCOUNTER — Other Ambulatory Visit: Payer: Self-pay | Admitting: Family Medicine

## 2019-11-01 ENCOUNTER — Telehealth: Payer: Self-pay

## 2019-11-01 DIAGNOSIS — F909 Attention-deficit hyperactivity disorder, unspecified type: Secondary | ICD-10-CM

## 2019-11-01 MED ORDER — LISDEXAMFETAMINE DIMESYLATE 70 MG PO CAPS: 70.0000 mg | ORAL_CAPSULE | Freq: Every day | ORAL | 0 refills | Status: DC
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
## 2019-11-01 NOTE — Telephone Encounter (Signed)
done

## 2019-11-02 NOTE — Telephone Encounter (Signed)
Patient notified that rx was sent in  

## 2019-12-01 ENCOUNTER — Other Ambulatory Visit: Payer: Self-pay | Admitting: Family Medicine

## 2019-12-01 DIAGNOSIS — F411 Generalized anxiety disorder: Secondary | ICD-10-CM

## 2019-12-01 DIAGNOSIS — F909 Attention-deficit hyperactivity disorder, unspecified type: Secondary | ICD-10-CM

## 2019-12-01 MED ORDER — LISDEXAMFETAMINE DIMESYLATE 70 MG PO CAPS: 70.0000 mg | ORAL_CAPSULE | Freq: Every day | ORAL | 0 refills | Status: DC

## 2019-12-01 NOTE — Telephone Encounter (Signed)
Refill Request: Klonopin 1mg  Last refill: 10/28/2019 #90, 0 refills LOV: 09/02/2019 Next OV: Not scheduled.  UDS: 11/2018  Refill Request: Vyvanse 70mg  Last refill: 11/01/2019, #30, 0 refill LOV: 09/02/2019 Next OV: Not scheduled.   Please advise.

## 2019-12-01 NOTE — Telephone Encounter (Signed)
Pt called to request refill for lisdexamfetamine (VYVANSE) 70 MG capsule  Please advise and send to pharmacy

## 2019-12-27 ENCOUNTER — Other Ambulatory Visit: Payer: Self-pay | Admitting: Family Medicine

## 2019-12-31 ENCOUNTER — Other Ambulatory Visit: Payer: Self-pay | Admitting: Family Medicine

## 2019-12-31 DIAGNOSIS — E538 Deficiency of other specified B group vitamins: Secondary | ICD-10-CM

## 2020-01-09 ENCOUNTER — Other Ambulatory Visit: Payer: Self-pay | Admitting: Family Medicine

## 2020-01-09 DIAGNOSIS — F909 Attention-deficit hyperactivity disorder, unspecified type: Secondary | ICD-10-CM

## 2020-01-09 DIAGNOSIS — F411 Generalized anxiety disorder: Secondary | ICD-10-CM

## 2020-01-09 NOTE — Telephone Encounter (Signed)
Medication Refill - Medication:  lisdexamfetamine (VYVANSE) 70 MG capsule  Has the patient contacted their pharmacy?  Yes advised to call office. Pt is completely out.   Preferred Pharmacy (with phone number or street name):  [id number] [place], [place] - [address] Phone:  [phone]  Fax:  [phone]     Agent: Please be advised that RX refills may take up to 3 business days. We ask that you follow-up with your pharmacy.

## 2020-01-11 NOTE — Telephone Encounter (Signed)
Requesting: Klonopin Contract: 12/14/2018 UDS: 12/14/2018 Last OV: 09/02/2019 Next OV: N/A Last Refill: 11/30/2019, #90--0 RF Database:   Please advise

## 2020-01-11 NOTE — Telephone Encounter (Signed)
Requesting: Vyvanse Contract: 12/14/2018 UDS: 12/14/2018 Last OV: 09/02/2019  Next OV: N/A Last Refill: 12/01/2019, #30--0 RF Database:   Please advise

## 2020-01-11 NOTE — Telephone Encounter (Signed)
Pt calling to check status. Please advise  °

## 2020-01-12 MED ORDER — LISDEXAMFETAMINE DIMESYLATE 70 MG PO CAPS: 70.0000 mg | ORAL_CAPSULE | Freq: Every day | ORAL | 0 refills | Status: DC

## 2020-01-24 ENCOUNTER — Other Ambulatory Visit: Payer: Self-pay | Admitting: Family Medicine

## 2020-01-24 DIAGNOSIS — G8929 Other chronic pain: Secondary | ICD-10-CM

## 2020-02-07 ENCOUNTER — Other Ambulatory Visit: Payer: Self-pay | Admitting: Family Medicine

## 2020-02-10 ENCOUNTER — Other Ambulatory Visit: Payer: Self-pay

## 2020-02-10 DIAGNOSIS — F909 Attention-deficit hyperactivity disorder, unspecified type: Secondary | ICD-10-CM

## 2020-02-10 MED ORDER — LISDEXAMFETAMINE DIMESYLATE 70 MG PO CAPS: 70.0000 mg | ORAL_CAPSULE | Freq: Every day | ORAL | 0 refills | Status: DC

## 2020-02-10 NOTE — Telephone Encounter (Signed)
Requesting: Vyvanse Contract: 12/14/2018 UDS: 12/14/2018 Last OV: 09/02/2019 Next OV: N/A Last Refill: 01/12/20, #30--0 RF Database:   Please advise

## 2020-02-10 NOTE — Telephone Encounter (Signed)
Patient called in to see if Dr. Laury Axon can send in a prescription for   (BYBANSE 70 mg)   Thanks ,

## 2020-02-13 ENCOUNTER — Other Ambulatory Visit: Payer: Self-pay | Admitting: Family Medicine

## 2020-02-13 DIAGNOSIS — F411 Generalized anxiety disorder: Secondary | ICD-10-CM

## 2020-02-13 NOTE — Telephone Encounter (Signed)
Called informed of PCP instructions. She will call in the morning to schedule a Virtual Visit (currently living with someone who tested positive for Covid). She did verbalize understanding

## 2020-02-13 NOTE — Telephone Encounter (Signed)
Requesting:   clonazepam Contract:   CSC  12/14/2018 UDS:   12/14/2018 Last Visit:    09/02/2019 Next Visit:  Not scheduled Last Refill:    01/12/2020----#90 no refills  Please Advise

## 2020-02-13 NOTE — Telephone Encounter (Signed)
I will refill x 1 but I really do not like she is taking it 3x a day everyday She will need ov to discuss

## 2020-02-19 ENCOUNTER — Other Ambulatory Visit: Payer: Self-pay | Admitting: Podiatry

## 2020-02-20 NOTE — Telephone Encounter (Signed)
Pt hasn't been seen since November 2019

## 2020-03-14 ENCOUNTER — Other Ambulatory Visit: Payer: Self-pay | Admitting: Podiatry

## 2020-03-14 ENCOUNTER — Telehealth: Payer: Self-pay | Admitting: Family Medicine

## 2020-03-14 DIAGNOSIS — F909 Attention-deficit hyperactivity disorder, unspecified type: Secondary | ICD-10-CM

## 2020-03-14 NOTE — Telephone Encounter (Signed)
Medication:lisdexamfetamine (VYVANSE) 70 MG capsule   Pt for got to call in on the 15th and only has 1 left  Has the patient contacted their pharmacy? No. (If no, request that the patient contact the pharmacy for the refill.) (If yes, when and what did the pharmacy advise?)  Preferred Pharmacy (with phone number or street name):[id number] [place], [place] - [address]  [address], [place] [address]  Phone:  [phone] Fax:  [phone] Agent: Please be advised that RX refills may take up to 3 business days. We ask that you follow-up with your pharmacy.

## 2020-03-15 ENCOUNTER — Other Ambulatory Visit: Payer: Self-pay | Admitting: Family Medicine

## 2020-03-15 DIAGNOSIS — F411 Generalized anxiety disorder: Secondary | ICD-10-CM

## 2020-03-15 MED ORDER — CLONAZEPAM 1 MG PO TABS: 1.0000 mg | ORAL_TABLET | Freq: Two times a day (BID) | ORAL | 0 refills | Status: DC | PRN

## 2020-03-15 NOTE — Telephone Encounter (Signed)
Requesting: Clonazepam 1mg , TID Contract:12/14/18 UDS:12/14/18, future order entered 09/28/19 Last Visit:09/02/19 Next Visit:n/a Last Refill:02/13/20(90,0)   Please Advise. Medication pending, verified with pharmacy that no refills on file. Last p/u 02/20/20 #90, 30 d/s.

## 2020-03-15 NOTE — Telephone Encounter (Signed)
Left VM. Advised pt to call pharmacy to see about refills on file

## 2020-03-15 NOTE — Telephone Encounter (Signed)
Pt should not be taking klonopin tid every day I advise inc prozac to 40 mg -----daily She should also make app with psychiatrist ---- we can send her list

## 2020-03-22 NOTE — Telephone Encounter (Signed)
Left message on machine to call back  

## 2020-04-07 ENCOUNTER — Other Ambulatory Visit: Payer: Self-pay | Admitting: Family Medicine

## 2020-04-07 DIAGNOSIS — E538 Deficiency of other specified B group vitamins: Secondary | ICD-10-CM

## 2020-04-09 ENCOUNTER — Other Ambulatory Visit: Payer: Self-pay | Admitting: Family Medicine

## 2020-04-12 ENCOUNTER — Other Ambulatory Visit: Payer: Self-pay | Admitting: Family Medicine

## 2020-04-12 DIAGNOSIS — F909 Attention-deficit hyperactivity disorder, unspecified type: Secondary | ICD-10-CM

## 2020-04-12 MED ORDER — LISDEXAMFETAMINE DIMESYLATE 70 MG PO CAPS: 70.0000 mg | ORAL_CAPSULE | Freq: Every day | ORAL | 0 refills | Status: DC

## 2020-04-12 NOTE — Telephone Encounter (Signed)
Medication: lisdexamfetamine (VYVANSE) 70 MG capsule [[id number]   Has the patient contacted their pharmacy? Yes.   (If no, request that the patient contact the pharmacy for the refill.) (If yes, when and what did the pharmacy advise?)  Preferred Pharmacy (with phone number or street name): [id number] [place], [place] - [address]  [address], [place] [address]  Phone:  [phone] Fax:  [phone]  DEA #:  [id number]  Agent: Please be advised that RX refills may take up to 3 business days. We ask that you follow-up with your pharmacy.

## 2020-04-12 NOTE — Telephone Encounter (Signed)
Requesting: Vyvanse  Contract: 12/14/2018 UDS:09/28/2019 Last Visit:09/02/2019 Next Visit:none Last Refill:02/10/2020, 3 rxs  Please Advise

## 2020-04-14 ENCOUNTER — Other Ambulatory Visit: Payer: Self-pay | Admitting: Family Medicine

## 2020-04-30 ENCOUNTER — Other Ambulatory Visit: Payer: Self-pay | Admitting: Family Medicine

## 2020-04-30 DIAGNOSIS — F411 Generalized anxiety disorder: Secondary | ICD-10-CM

## 2020-04-30 NOTE — Telephone Encounter (Signed)
Requesting: Klonopin Contract: 12/14/2018 UDS: 12/14/18 Last OV: 09/042020 Next OV: N/A Last Refill: 03/15/20, #60--0 RF Database:   Please advise

## 2020-05-18 ENCOUNTER — Other Ambulatory Visit: Payer: Self-pay | Admitting: Family Medicine

## 2020-05-18 DIAGNOSIS — F909 Attention-deficit hyperactivity disorder, unspecified type: Secondary | ICD-10-CM

## 2020-05-18 MED ORDER — LISDEXAMFETAMINE DIMESYLATE 70 MG PO CAPS: 70.0000 mg | ORAL_CAPSULE | Freq: Every day | ORAL | 0 refills | Status: DC

## 2020-05-18 NOTE — Telephone Encounter (Signed)
Medication:lisdexamfetamine (VYVANSE) 70 MG capsul  Has the patient contacted their pharmacy? No. (If no, request that the patient contact the pharmacy for the refill.) (If yes, when and what did the pharmacy advise?)  Preferred Pharmacy (with phone number or street name):   [id number] [place], [place] - [address]  [address], [place] [address]  Phone:  [phone] Fax:  [phone]  DEA #:  [id number] Agent: Please be advised that RX refills may take up to 3 business days. We ask that you follow-up with your pharmacy.

## 2020-05-18 NOTE — Telephone Encounter (Signed)
Vyvanse refill.   Last OV: 09/02/2019 Last Fill: 04/12/20 #30 and 0RF UDS: 12/14/2018 Low risk

## 2020-06-18 ENCOUNTER — Other Ambulatory Visit: Payer: Self-pay | Admitting: Family Medicine

## 2020-06-18 DIAGNOSIS — F411 Generalized anxiety disorder: Secondary | ICD-10-CM

## 2020-06-18 DIAGNOSIS — F909 Attention-deficit hyperactivity disorder, unspecified type: Secondary | ICD-10-CM

## 2020-06-18 NOTE — Telephone Encounter (Signed)
Requesting: Vyvanse Contract:  12/14/2018 UDS: 12/14/2018 Last OV: 09/02/2019 Next OV: N/A Last Refill: 05/18/2020, #30--0 RF Database:   Please advise

## 2020-06-18 NOTE — Telephone Encounter (Signed)
Medication: lisdexamfetamine (VYVANSE) 70 MG capsule  Has the patient contacted their pharmacy? Yes.   (If no, request that the patient contact the pharmacy for the refill.) (If yes, when and what did the pharmacy advise?)  Preferred Pharmacy (with phone number or street name):   [id number] [place], [place] - [address]  [address], [place] [address]  Phone:  [phone] Fax:  [phone] Agent: Please be advised that RX refills may take up to 3 business days. We ask that you follow-up with your pharmacy.

## 2020-06-19 MED ORDER — LISDEXAMFETAMINE DIMESYLATE 70 MG PO CAPS: 70.0000 mg | ORAL_CAPSULE | Freq: Every day | ORAL | 0 refills | Status: DC

## 2020-07-10 ENCOUNTER — Other Ambulatory Visit: Payer: Self-pay | Admitting: Family Medicine

## 2020-07-10 DIAGNOSIS — E538 Deficiency of other specified B group vitamins: Secondary | ICD-10-CM

## 2020-07-11 ENCOUNTER — Telehealth: Payer: 59 | Admitting: Family Medicine

## 2020-07-12 ENCOUNTER — Other Ambulatory Visit: Payer: Self-pay | Admitting: Family Medicine

## 2020-07-27 ENCOUNTER — Other Ambulatory Visit: Payer: Self-pay | Admitting: Family Medicine

## 2020-07-27 DIAGNOSIS — F909 Attention-deficit hyperactivity disorder, unspecified type: Secondary | ICD-10-CM

## 2020-07-27 NOTE — Telephone Encounter (Signed)
New Message:    1.Medication Requested: lisdexamfetamine (VYVANSE) 70 MG capsule 2. Pharmacy (Name, Street, [place]): [id number] [place], [place] - [address] 3. On Med List: Yes  4. Last Visit with PCP:   5. Next visit date with PCP:   Agent: Please be advised that RX refills may take up to 3 business days. We ask that you follow-up with your pharmacy.

## 2020-07-28 MED ORDER — LISDEXAMFETAMINE DIMESYLATE 70 MG PO CAPS: 70.0000 mg | ORAL_CAPSULE | Freq: Every day | ORAL | 0 refills | Status: DC

## 2020-07-28 NOTE — Telephone Encounter (Signed)
Requesting: Vyvanse Contract: none UDS:09/28/2019 Last Visit:09/02/2019 Next Visit: none Last Refill: 06/19/2020  Please Advise

## 2020-08-07 ENCOUNTER — Other Ambulatory Visit: Payer: Self-pay | Admitting: Family Medicine

## 2020-09-04 ENCOUNTER — Ambulatory Visit (HOSPITAL_BASED_OUTPATIENT_CLINIC_OR_DEPARTMENT_OTHER)
Admission: RE | Admit: 2020-09-04 | Discharge: 2020-09-04 | Disposition: A | Discharge status: Home / Self Care | Payer: 59 | Source: Ambulatory Visit | Attending: Medical | Admitting: Medical

## 2020-09-04 ENCOUNTER — Ambulatory Visit: Payer: 59 | Admitting: Medical

## 2020-09-04 ENCOUNTER — Other Ambulatory Visit: Payer: Self-pay

## 2020-09-04 ENCOUNTER — Other Ambulatory Visit: Payer: Self-pay | Admitting: Family Medicine

## 2020-09-04 VITALS — BP 139/85 | HR 81 | Temp 98.5°F | Resp 18 | Ht 65.0 in | Wt 207.6 lb

## 2020-09-04 DIAGNOSIS — E039 Hypothyroidism, unspecified: Secondary | ICD-10-CM | POA: Diagnosis not present

## 2020-09-04 DIAGNOSIS — E1169 Type 2 diabetes mellitus with other specified complication: Secondary | ICD-10-CM | POA: Diagnosis not present

## 2020-09-04 DIAGNOSIS — H814 Vertigo of central origin: Secondary | ICD-10-CM | POA: Diagnosis not present

## 2020-09-04 DIAGNOSIS — F411 Generalized anxiety disorder: Secondary | ICD-10-CM

## 2020-09-04 DIAGNOSIS — M25562 Pain in left knee: Secondary | ICD-10-CM

## 2020-09-04 DIAGNOSIS — R479 Unspecified speech disturbances: Secondary | ICD-10-CM

## 2020-09-04 DIAGNOSIS — R42 Dizziness and giddiness: Secondary | ICD-10-CM

## 2020-09-04 DIAGNOSIS — E119 Type 2 diabetes mellitus without complications: Secondary | ICD-10-CM

## 2020-09-04 DIAGNOSIS — I10 Essential (primary) hypertension: Secondary | ICD-10-CM

## 2020-09-04 DIAGNOSIS — E785 Hyperlipidemia, unspecified: Secondary | ICD-10-CM

## 2020-09-04 DIAGNOSIS — M2550 Pain in unspecified joint: Secondary | ICD-10-CM

## 2020-09-04 DIAGNOSIS — G510 Bell's palsy: Secondary | ICD-10-CM

## 2020-09-04 MED ORDER — CLONAZEPAM 1 MG PO TABS: 1.0000 mg | ORAL_TABLET | Freq: Two times a day (BID) | ORAL | 0 refills | Status: DC | PRN

## 2020-09-04 NOTE — Telephone Encounter (Signed)
Patient came in to see Ramon D[facility] today wanting clonazepam, and he was unsure on to send it since she has not had contract or UDS since 2019

## 2020-09-04 NOTE — Patient Instructions (Addendum)
For hypertension, continue current BP medication.  Your blood pressure is a little bit borderline high but did not take medication today.  Continue lisinopril/HCTZ  History of hyperlipidemia.  We will get metabolic panel and lipid panel today..  Continue with Crestor might need to adjust dosage based on labs.  For history of diabetes, will get A1c today.  Continue Metformin and might need to make adjustments if level elevated.  For left knee pain and described arthralgias, will get arthritis panel and inflammatory labs.  You have history of hypothyroidism and getting TSH and T4 today.  You describe more than 1 month ago history of  dizziness with your ear  discomfort, facial palsy and inability to close her right eye.  The symptoms persist to some degree even after more than a month.  It sounds like you probably are recovering from Bell's palsy but remote stroke 1 month or more is possibility.  Did place order for CT of head without contrast.  You can get that done today.  Please go downstairs and get the study.  Wait for those results.  We will update on testing result.  If study is negative then will likely refer you to neurologist for further evaluation to see if they think you had Bell's palsy.   If you have any acute new  change of motor function or neurologic function then recommend ED evaluation.  MRI not indicated presently.  For anxiety I did go ahead and refill clonazepam.  Recent worse anxiety with mother's lung cancer diagnosis.  Will refer future refills to patient's PCP.  Follow-up in 2 weeks with PCP or as needed.

## 2020-09-04 NOTE — Progress Notes (Signed)
Subjective:    Patient ID: [name], female    DOB: [birth year], 54 y.o.   MRN: [id number]  HPI   Pt in today for follow up.  Pt is fasting.  Pt recently very busy past 2 months with mom having lung cancer.  Pt also notes she had rt side ear clicking while at [employer]. Pt had no pain but when she went to [facility]. She states that ENT clinic did incise her tm and ear drained. She states feels better now.  Pt states also she has rt side face droop for months. Pt was seen at [facility] and they recommended imaging. Pt stats for couple of months of slurred speech. Often later in the day. Pt also has been having dizziness for months as well. Pt states 2 months ago she was having trouble closing her rt eye. Still some mild effort trying to close rt eye. But can.   Pt has diabetes and high cholesterol.   Last a1c was 6.7.     Review of Systems  Constitutional: Negative for chills, fatigue and fever.  HENT:       Ear discomfort is resolved.  Respiratory: Negative for cough and wheezing.   Cardiovascular: Negative for chest pain and palpitations.  Gastrointestinal: Negative for abdominal pain.  Genitourinary: Negative for dysuria.  Musculoskeletal: Negative for back pain.  Neurological: Positive for dizziness. Negative for tremors, syncope, facial asymmetry, weakness and light-headedness.       Mild decreased sensation of rt side face. Mild difficulty closing rt ear for months.  Hematological: Negative for adenopathy. Does not bruise/bleed easily.  Psychiatric/Behavioral: Negative for behavioral problems and confusion.    Past Medical History:  Diagnosis Date  . Abdominal pain   . Anemia   . Arthritis   . Bruises easily   . Depression   . Extremity numbness    legs, feet, arms, hands  . Foot fracture    bilateral  . Frequent falls   . Hair loss   . Hypotension   . Irritable bowel   . Kidney stones   . Leg pain    when driving long distances  . Memory loss   . Migraines    . Panic disorder   . Vision problems      Social History   Socioeconomic History  . Marital status: Married    Spouse name: Not on file  . Number of children: 1  . Years of education: 51  . Highest education level: Not on file  Occupational History  . Occupation: not working  Tobacco Use  . Smoking status: Current Every Day Smoker    Packs/day: 1.00    Years: 30.00    Pack years: 30.00  . Smokeless tobacco: Never Used  Vaping Use  . Vaping Use: Never used  Substance and Sexual Activity  . Alcohol use: No  . Drug use: No  . Sexual activity: Yes    Partners: Male  Other Topics Concern  . Not on file  Social History Narrative   She lives with husband.  They have one grown child.   Highest level of education:  Associates degree in early childhood   Social Determinants of Health   Financial Resource Strain:   . Difficulty of Paying Living Expenses: Not on file  Food Insecurity:   . Worried About Programme researcher, broadcasting/film/video in the Last Year: Not on file  . Ran Out of Food in the Last Year: Not on file  Transportation  Needs:   . Lack of Transportation (Medical): Not on file  . Lack of Transportation (Non-Medical): Not on file  Physical Activity:   . Days of Exercise per Week: Not on file  . Minutes of Exercise per Session: Not on file  Stress:   . Feeling of Stress : Not on file  Social Connections:   . Frequency of Communication with Friends and Family: Not on file  . Frequency of Social Gatherings with Friends and Family: Not on file  . Attends Religious Services: Not on file  . Active Member of Clubs or Organizations: Not on file  . Attends BankerClub or Organization Meetings: Not on file  . Marital Status: Not on file  Intimate Partner Violence:   . Fear of Current or Ex-Partner: Not on file  . Emotionally Abused: Not on file  . Physically Abused: Not on file  . Sexually Abused: Not on file    Past Surgical History:  Procedure Laterality Date  . ABDOMINAL HYSTERECTOMY     . ABDOMINOPLASTY    . CHOLECYSTECTOMY    . FOOT SURGERY Left    Plantar Fasc  . LAPAROSCOPIC GASTRIC BANDING    . TONSILLECTOMY      Family History  Problem Relation Age of Onset  . Hypertension Mother   . Deep vein thrombosis Mother        Finger  . Panic disorder Mother   . Hypertension Father   . Heart attack Father        Deceased, 1350s  . Diabetes Maternal Grandmother   . Heart disease Maternal Grandmother        chf MI  . Heart disease Maternal Grandfather [id number]       MI    No Known Allergies  Current Outpatient Medications on File Prior to Visit  Medication Sig Dispense Refill  . albuterol (PROAIR HFA) 108 (90 Base) MCG/ACT inhaler Inhale 2 puffs into the lungs every 6 (six) hours as needed for wheezing or shortness of breath. 1 Inhaler 3  . clonazePAM (KLONOPIN) 1 MG tablet TAKE 1 TABLET (1 MG TOTAL) BY MOUTH 2 (TWO) TIMES DAILY AS NEEDED. 60 tablet 0  . cyanocobalamin (,VITAMIN B-12,) 1000 MCG/ML injection INJECT 1 ML INTO THE MUSCLE EVERY 30 DAYS. 1 mL 2  . FLUoxetine (PROZAC) 20 MG capsule TAKE 1 CAPSULE (20 MG TOTAL) BY MOUTH DAILY. PT NEEDS OV FOR FURTHER REFILLS 30 capsule 0  . gabapentin (NEURONTIN) 600 MG tablet TAKE 1 TABLET BY MOUTH THREE TIMES A DAY 90 tablet 11  . glucose blood (ONETOUCH VERIO) test strip Use as directed once day.  DX: E11.51. 100 each 1  . lisdexamfetamine (VYVANSE) 70 MG capsule Take 1 capsule (70 mg total) by mouth daily. 30 capsule 0  . lisdexamfetamine (VYVANSE) 70 MG capsule Take 1 capsule (70 mg total) by mouth daily. 30 capsule 0  . lisdexamfetamine (VYVANSE) 70 MG capsule Take 1 capsule (70 mg total) by mouth daily. 30 capsule 0  . lisinopril-hydrochlorothiazide (ZESTORETIC) 20-12.5 MG tablet Take 2 tablets by mouth daily. 180 tablet 3  . meclizine (ANTIVERT) 25 MG tablet Take 1 tablet (25 mg total) by mouth 3 (three) times daily as needed for dizziness. 30 tablet 0  . meloxicam (MOBIC) 15 MG tablet TAKE 1 TABLET BY MOUTH EVERY DAY  30 tablet 0  . metFORMIN (GLUCOPHAGE-XR) 500 MG 24 hr tablet Take 1 tablet (500 mg total) by mouth daily with breakfast. Pt needs OV for further refills 90 tablet 0  .  ONETOUCH DELICA LANCETS FINE MISC USE AS DIRECTED ONCE A DAY. DX: E11.51 *INSURANCE ONLY COVERS 30 DAY SUPPLY *PT NEEDS OFFICE VISIT 100 each 0  . rosuvastatin (CRESTOR) 10 MG tablet TAKE 1 TABLET BY MOUTH EVERY DAY 30 tablet 2  . SYRINGE-NEEDLE, DISP, 3 ML (B-D 3CC LUER-LOK SYR 25GX1") 25G X 1" 3 ML MISC USE AS DIRECTED MONTHLY WITH B12 3 each 3  . traMADol (ULTRAM) 50 MG tablet Take 1 tablet (50 mg total) by mouth every 6 (six) hours as needed for severe pain. 4 tablet 0  . Vitamin D, Ergocalciferol, (DRISDOL) 1.25 MG (50000 UT) CAPS capsule Take 1 capsule (50,000 Units total) by mouth every 7 (seven) days. 12 capsule 0   No current facility-administered medications on file prior to visit.    BP (!) 145/99   Pulse 81   Temp 98.5 F (36.9 C) (Oral)   Resp 18   Ht 5\' 5"  (1.651 m)   Wt 207 lb 9.6 oz (94.2 kg)   SpO2 98%   BMI 34.55 kg/m       Objective:   Physical Exam   General Mental Status- Alert. General Appearance- Not in acute distress.   Skin General: Color- Normal Color. Moisture- Normal Moisture.  Neck Carotid Arteries- Normal color. Moisture- Normal Moisture. No carotid bruits. No JVD.  Chest and Lung Exam Auscultation: Breath Sounds:-Normal.  Cardiovascular Auscultation:Rythm- Regular. Murmurs & Other Heart Sounds:Auscultation of the heart reveals- No Murmurs.  Abdomen Inspection:-Inspeection Normal. Palpation/Percussion:Note:No mass. Palpation and Percussion of the abdomen reveal- Non Tender, Non Distended + BS, no rebound or guarding.    Neurologic Cranial Nerve exam:- CN III-XII intact(No nystagmus), symmetric smile. No obvious asymmetry. Mild diffuclty closing rt eye but can. Faint decreased sensation rt side face. Drift Test:- No drift. Romberg Exam:- Negative.  Heal to Toe Gait  exam:-Normal. Finger to Nose:- Normal/Intact Strength:- 5/5 equal and symmetric strength both upper and lower extremities.   heent- no frontal or maxillary sinus pain. Rt ear - canal is blocked with wax. Can't see tm.         Assessment & Plan:  For hypertension, continue current BP medication.  Your blood pressure is a little bit borderline high but did not take medication today.  Continue lisinopril/HCTZ  History of hyperlipidemia.  We will get metabolic panel and lipid panel today..  Continue with Crestor might need to adjust dosage based on labs.  For history of diabetes, will get A1c today.  Continue Metformin and might need to make adjustments if level elevated.  For left knee pain and described arthralgias, will get arthritis panel and inflammatory labs.  You have history of hypothyroidism and getting TSH and T4 today.  You describe more than 1 month ago history of  dizziness with your ear  discomfort, facial palsy and inability to close her right eye.  The symptoms persist to some degree even after more than a month.  It sounds like you probably are recovering from Bell's palsy but remote stroke 1 month or more is possibility.  Did place order for CT of head without contrast.  You can get that done today.  Please go downstairs and get the study.  Wait for those results.  We will update on testing result.  If study is negative then will likely refer you to neurologist for further evaluation to see if they think you had Bell's palsy.   If you have any acute new  change of motor function or neurologic function then  recommend ED evaluation.  MRI not indicated presently.  Follow-up in 2 weeks with PCP or as needed.  Esperanza Richters, PA-C

## 2020-09-05 ENCOUNTER — Telehealth: Payer: Self-pay | Admitting: Medical

## 2020-09-05 LAB — CBC WITH DIFFERENTIAL/PLATELET
Absolute Monocytes: 322 cells/uL (ref 200–950)
Basophils Absolute: 63 cells/uL (ref 0–200)
Basophils Relative: 0.9 %
Eosinophils Absolute: 329 cells/uL (ref 15–500)
Eosinophils Relative: 4.7 %
HCT: 43.4 % (ref 35.0–45.0)
Hemoglobin: 15 g/dL (ref 11.7–15.5)
Lymphs Abs: 1764 cells/uL (ref 850–3900)
MCH: 31.8 pg (ref 27.0–33.0)
MCHC: 34.6 g/dL (ref 32.0–36.0)
MCV: 91.9 fL (ref 80.0–100.0)
MPV: 11.6 fL (ref 7.5–12.5)
Monocytes Relative: 4.6 %
Neutro Abs: 4522 cells/uL (ref 1500–7800)
Neutrophils Relative %: 64.6 %
Platelets: 280 10*3/uL (ref 140–400)
RBC: 4.72 10*6/uL (ref 3.80–5.10)
RDW: 12.8 % (ref 11.0–15.0)
Total Lymphocyte: 25.2 %
WBC: 7 10*3/uL (ref 3.8–10.8)

## 2020-09-05 LAB — COMPREHENSIVE METABOLIC PANEL
AG Ratio: 1.4 (calc) (ref 1.0–2.5)
ALT: 22 U/L (ref 6–29)
AST: 33 U/L (ref 10–35)
Albumin: 4 g/dL (ref 3.6–5.1)
Alkaline phosphatase (APISO): 86 U/L (ref 37–153)
BUN/Creatinine Ratio: 8 (calc) (ref 6–22)
BUN: 6 mg/dL — ABNORMAL LOW (ref 7–25)
CO2: 23 mmol/L (ref 20–32)
Calcium: 9.6 mg/dL (ref 8.6–10.4)
Chloride: 107 mmol/L (ref 98–110)
Creat: 0.77 mg/dL (ref 0.50–1.05)
Globulin: 2.9 g/dL (calc) (ref 1.9–3.7)
Glucose, Bld: 106 mg/dL — ABNORMAL HIGH (ref 65–99)
Potassium: 3.9 mmol/L (ref 3.5–5.3)
Sodium: 141 mmol/L (ref 135–146)
Total Bilirubin: 1.3 mg/dL — ABNORMAL HIGH (ref 0.2–1.2)
Total Protein: 6.9 g/dL (ref 6.1–8.1)

## 2020-09-05 LAB — HEMOGLOBIN A1C
Hgb A1c MFr Bld: 5.8 % of total Hgb — ABNORMAL HIGH (ref <5.7)
Mean Plasma Glucose: 120 (calc)
eAG (mmol/L): 6.6 (calc)

## 2020-09-05 LAB — LIPID PANEL
Cholesterol: 210 mg/dL — ABNORMAL HIGH (ref <200)
HDL: 34 mg/dL — ABNORMAL LOW (ref >=50)
LDL Cholesterol (Calc): 141 mg/dL (calc) — ABNORMAL HIGH
Non-HDL Cholesterol (Calc): 176 mg/dL (calc) — ABNORMAL HIGH (ref <130)
Total CHOL/HDL Ratio: 6.2 (calc) — ABNORMAL HIGH (ref <5.0)
Triglycerides: 210 mg/dL — ABNORMAL HIGH (ref <150)

## 2020-09-05 LAB — T4, FREE: Free T4: 1 ng/dL (ref 0.8–1.8)

## 2020-09-05 LAB — RHEUMATOID FACTOR: Rheumatoid fact SerPl-aCnc: <14 IU/mL (ref <14)

## 2020-09-05 LAB — C-REACTIVE PROTEIN: CRP: 18.6 mg/L — ABNORMAL HIGH (ref <8.0)

## 2020-09-05 LAB — TSH: TSH: 1.35 mIU/L

## 2020-09-05 LAB — ANA: Anti Nuclear Antibody (ANA): NEGATIVE

## 2020-09-05 LAB — SEDIMENTATION RATE: Sed Rate: 9 mm/h (ref 0–30)

## 2020-09-05 MED ORDER — ROSUVASTATIN CALCIUM 20 MG PO TABS: 20.0000 mg | ORAL_TABLET | Freq: Every day | ORAL | 2 refills | Status: DC

## 2020-09-05 NOTE — Telephone Encounter (Signed)
rx crestor sent to pharmacy.

## 2020-09-06 ENCOUNTER — Other Ambulatory Visit: Payer: Self-pay | Admitting: Family Medicine

## 2020-09-06 DIAGNOSIS — J9801 Acute bronchospasm: Secondary | ICD-10-CM

## 2020-09-06 DIAGNOSIS — F411 Generalized anxiety disorder: Secondary | ICD-10-CM

## 2020-09-06 MED ORDER — CLONAZEPAM 1 MG PO TABS: 1.0000 mg | ORAL_TABLET | Freq: Two times a day (BID) | ORAL | 0 refills | Status: DC | PRN

## 2020-09-25 ENCOUNTER — Telehealth: Payer: Self-pay | Admitting: Family Medicine

## 2020-09-25 ENCOUNTER — Other Ambulatory Visit: Payer: Self-pay | Admitting: Family Medicine

## 2020-09-25 DIAGNOSIS — F909 Attention-deficit hyperactivity disorder, unspecified type: Secondary | ICD-10-CM

## 2020-09-25 MED ORDER — LISDEXAMFETAMINE DIMESYLATE 70 MG PO CAPS: 70.0000 mg | ORAL_CAPSULE | Freq: Every day | ORAL | 0 refills | Status: DC

## 2020-09-25 NOTE — Telephone Encounter (Signed)
Vyvanse refill.   Last OV: 09/04/2020 w/ Edward Last Fill: 07/28/2020 #30 and 0RF UDS: 12/14/2018 Low risk

## 2020-09-25 NOTE — Telephone Encounter (Signed)
Needs uds / contract  Database reviewed

## 2020-09-25 NOTE — Telephone Encounter (Signed)
Medication: lisdexamfetamine (VYVANSE) 70 MG capsule [[id number]]       Has the patient contacted their pharmacy?  (If no, request that the patient contact the pharmacy for the refill.) (If yes, when and what did the pharmacy advise?)     Preferred Pharmacy (with phone number or street name): [facility] [address] [place], [place] - [address] [facility] [address] [place] [address]  [address] [place] [address]  Phone:  [phone] Fax:  [phone]      Agent: Please be advised that RX refills may take up to 3 business days. We ask that you follow-up with your pharmacy.

## 2020-09-26 NOTE — Telephone Encounter (Signed)
Attempted to call patient. VM was full 

## 2020-09-27 ENCOUNTER — Other Ambulatory Visit: Payer: Self-pay | Admitting: Family Medicine

## 2020-10-01 ENCOUNTER — Other Ambulatory Visit: Payer: Self-pay | Admitting: Family Medicine

## 2020-10-03 ENCOUNTER — Other Ambulatory Visit: Payer: Self-pay | Admitting: Family Medicine

## 2020-10-03 DIAGNOSIS — E538 Deficiency of other specified B group vitamins: Secondary | ICD-10-CM

## 2020-10-08 ENCOUNTER — Telehealth: Payer: Self-pay | Admitting: *Deleted

## 2020-10-08 DIAGNOSIS — F909 Attention-deficit hyperactivity disorder, unspecified type: Secondary | ICD-10-CM

## 2020-10-08 NOTE — Telephone Encounter (Signed)
Pt has lab appt Friday. Appt notes says UDS and contract. I do not see future order for UDS and no contract is in the the box for pt.  Please place order if appropriate and print contract to place at front for upcoming appt. Thank you.

## 2020-10-08 NOTE — Telephone Encounter (Signed)
Orders placed. [facility] placed up front

## 2020-10-12 ENCOUNTER — Other Ambulatory Visit (HOSPITAL_BASED_OUTPATIENT_CLINIC_OR_DEPARTMENT_OTHER): Payer: Self-pay | Admitting: Internal Medicine

## 2020-10-12 ENCOUNTER — Ambulatory Visit: Payer: 59 | Attending: Internal Medicine

## 2020-10-12 ENCOUNTER — Other Ambulatory Visit: Payer: Self-pay

## 2020-10-12 ENCOUNTER — Other Ambulatory Visit: Payer: 59

## 2020-10-12 DIAGNOSIS — F909 Attention-deficit hyperactivity disorder, unspecified type: Secondary | ICD-10-CM

## 2020-10-12 DIAGNOSIS — Z23 Encounter for immunization: Secondary | ICD-10-CM

## 2020-10-12 MED FILL — FLUARIX QUADRIVALENT 0.5 ML: 0.5 | 1 days supply | Qty: 1 | Fill #0

## 2020-10-15 LAB — DRUG MONITORING, PANEL 8 WITH CONFIRMATION, URINE
6 Acetylmorphine: NEGATIVE ng/mL (ref <10)
Alcohol Metabolites: NEGATIVE ng/mL
Alphahydroxyalprazolam: NEGATIVE ng/mL (ref <25)
Alphahydroxymidazolam: NEGATIVE ng/mL (ref <50)
Alphahydroxytriazolam: NEGATIVE ng/mL (ref <50)
Aminoclonazepam: 126 ng/mL — ABNORMAL HIGH (ref <25)
Amphetamine: 14712 ng/mL — ABNORMAL HIGH (ref <250)
Amphetamines: POSITIVE ng/mL — AB (ref <500)
Benzodiazepines: POSITIVE ng/mL — AB (ref <100)
Buprenorphine, Urine: NEGATIVE ng/mL (ref <5)
Cocaine Metabolite: NEGATIVE ng/mL (ref <150)
Creatinine: 159.2 mg/dL
Hydroxyethylflurazepam: NEGATIVE ng/mL (ref <50)
Lorazepam: NEGATIVE ng/mL (ref <50)
MDMA: NEGATIVE ng/mL (ref <500)
Marijuana Metabolite: 18 ng/mL — ABNORMAL HIGH (ref <5)
Marijuana Metabolite: POSITIVE ng/mL — AB (ref <20)
Methamphetamine: NEGATIVE ng/mL (ref <250)
Nordiazepam: NEGATIVE ng/mL (ref <50)
Opiates: NEGATIVE ng/mL (ref <100)
Oxazepam: NEGATIVE ng/mL (ref <50)
Oxidant: NEGATIVE ug/mL
Oxycodone: NEGATIVE ng/mL (ref <100)
Temazepam: NEGATIVE ng/mL (ref <50)
pH: 5.9 (ref 4.5–9.0)

## 2020-10-15 LAB — DM TEMPLATE

## 2020-10-15 NOTE — Progress Notes (Signed)
   Covid-19 Vaccination Clinic  Name:  [name]    MRN: [id number] DOB: Jun 27, 1966  10/15/2020  [name] was observed post Covid-19 immunization for 15 minutes without incident. She was provided with Vaccine Information Sheet and instruction to access the V-Safe system.  Vaccinated by [facility]  [name] was instructed to call 911 with any severe reactions post vaccine: Marland Kitchen Difficulty breathing  . Swelling of face and throat  . A fast heartbeat  . A bad rash all over body  . Dizziness and weakness

## 2020-10-22 MED FILL — PFIZER-BIONTECH COVID-19 VA: 30 | 1 days supply | Qty: 0 | Fill #0

## 2020-11-12 ENCOUNTER — Other Ambulatory Visit: Payer: 59

## 2020-11-12 ENCOUNTER — Other Ambulatory Visit: Payer: Self-pay

## 2020-11-12 DIAGNOSIS — Z79899 Other long term (current) drug therapy: Secondary | ICD-10-CM

## 2020-11-14 LAB — DRUG MONITORING, PANEL 8 WITH CONFIRMATION, URINE
6 Acetylmorphine: NEGATIVE ng/mL (ref <10)
Alcohol Metabolites: NEGATIVE ng/mL
Alphahydroxyalprazolam: NEGATIVE ng/mL (ref <25)
Alphahydroxymidazolam: NEGATIVE ng/mL (ref <50)
Alphahydroxytriazolam: NEGATIVE ng/mL (ref <50)
Aminoclonazepam: 687 ng/mL — ABNORMAL HIGH (ref <25)
Amphetamines: NEGATIVE ng/mL (ref <500)
Benzodiazepines: POSITIVE ng/mL — AB (ref <100)
Buprenorphine, Urine: NEGATIVE ng/mL (ref <5)
Cocaine Metabolite: NEGATIVE ng/mL (ref <150)
Creatinine: 199.4 mg/dL
Hydroxyethylflurazepam: NEGATIVE ng/mL (ref <50)
Lorazepam: NEGATIVE ng/mL (ref <50)
MDMA: NEGATIVE ng/mL (ref <500)
Marijuana Metabolite: NEGATIVE ng/mL (ref <20)
Nordiazepam: NEGATIVE ng/mL (ref <50)
Opiates: NEGATIVE ng/mL (ref <100)
Oxazepam: NEGATIVE ng/mL (ref <50)
Oxidant: NEGATIVE ug/mL
Oxycodone: NEGATIVE ng/mL (ref <100)
Temazepam: NEGATIVE ng/mL (ref <50)
pH: 6 (ref 4.5–9.0)

## 2020-11-14 LAB — DM TEMPLATE

## 2020-11-16 ENCOUNTER — Other Ambulatory Visit: Payer: Self-pay

## 2020-11-16 ENCOUNTER — Other Ambulatory Visit: Payer: Self-pay | Admitting: Family Medicine

## 2020-11-16 DIAGNOSIS — F411 Generalized anxiety disorder: Secondary | ICD-10-CM

## 2020-11-16 DIAGNOSIS — F909 Attention-deficit hyperactivity disorder, unspecified type: Secondary | ICD-10-CM

## 2020-11-16 MED ORDER — LISDEXAMFETAMINE DIMESYLATE 70 MG PO CAPS: 70.0000 mg | ORAL_CAPSULE | Freq: Every day | ORAL | 0 refills | Status: DC

## 2020-11-16 NOTE — Telephone Encounter (Signed)
Refill Request -   MEDICATION: Vyvanse 70 MG  [facility] [address] [place]  Comments:   **Let patient know to contact pharmacy at the end of the day to make sure medication is ready. **  ** Please notify patient to allow 48-72 hours to process**  **Encourage patient to contact the pharmacy for refills or they can request refills through [facility]**

## 2020-11-16 NOTE — Telephone Encounter (Signed)
Requesting:Vyvanse Contract:10/12/2020 UDS:11/12/2020 Last Visit:09/04/2020 Next Visit:none Last Refill:09/25/2020  Please Advise

## 2020-12-03 ENCOUNTER — Other Ambulatory Visit: Payer: Self-pay | Admitting: Medical

## 2020-12-03 ENCOUNTER — Other Ambulatory Visit: Payer: Self-pay | Admitting: Family Medicine

## 2020-12-03 DIAGNOSIS — R42 Dizziness and giddiness: Secondary | ICD-10-CM

## 2020-12-04 MED ORDER — MECLIZINE HCL 25 MG PO TABS: 25.0000 mg | ORAL_TABLET | Freq: Three times a day (TID) | ORAL | 0 refills | Status: DC | PRN

## 2020-12-25 ENCOUNTER — Other Ambulatory Visit: Payer: Self-pay | Admitting: Family Medicine

## 2020-12-25 DIAGNOSIS — F411 Generalized anxiety disorder: Secondary | ICD-10-CM

## 2020-12-25 DIAGNOSIS — I10 Essential (primary) hypertension: Secondary | ICD-10-CM

## 2020-12-25 NOTE — Telephone Encounter (Signed)
Requesting: clonazepam 1mg  Contract: 10/08/2020 UDS: 11/12/2020 Last Visit: 09/04/2020 w/ 11/04/2020 Next Visit: None Last Refill: 09/06/2020 #60 and 0RF  [place] Pt  Please Advise

## 2021-01-08 ENCOUNTER — Other Ambulatory Visit: Payer: Self-pay | Admitting: Family Medicine

## 2021-01-08 DIAGNOSIS — E538 Deficiency of other specified B group vitamins: Secondary | ICD-10-CM

## 2021-01-17 ENCOUNTER — Other Ambulatory Visit: Payer: Self-pay | Admitting: Family Medicine

## 2021-01-17 DIAGNOSIS — F909 Attention-deficit hyperactivity disorder, unspecified type: Secondary | ICD-10-CM

## 2021-01-17 DIAGNOSIS — I10 Essential (primary) hypertension: Secondary | ICD-10-CM

## 2021-01-17 MED ORDER — LISDEXAMFETAMINE DIMESYLATE 70 MG PO CAPS: 70.0000 mg | ORAL_CAPSULE | Freq: Every day | ORAL | 0 refills | Status: DC

## 2021-01-17 NOTE — Telephone Encounter (Signed)
Help--  thanks  Crystal won't be here until tomorrow

## 2021-01-17 NOTE — Telephone Encounter (Incomplete)
Different pharmacy  Medication: lisdexamfetamine (VYVANSE) 70 MG capsule [[id number]]       Has the patient contacted their pharmacy?  (If no, request that the patient contact the pharmacy for the refill.) (If yes, when and what did the pharmacy advise?)     Preferred Pharmacy (with phone number or street name):  [facility] [address] [place],  - [address] [address] Phone:  [phone]  Fax:  [phone]          Agent: Please be advised that RX refills may take up to 3 business days. We ask that you follow-up with your pharmacy.

## 2021-01-17 NOTE — Telephone Encounter (Signed)
Requesting: Vyvanse 70mg  Contract: 10/08/2020 UDS: 11/12/2020 Last Visit: 09/04/2020 w/ 11/04/2020 Next Visit: None Last Refill: 11/16/2020 #30 and 0RF  Please Advise

## 2021-01-25 ENCOUNTER — Telehealth: Payer: Self-pay | Admitting: *Deleted

## 2021-01-25 NOTE — Telephone Encounter (Signed)
I can not send controlled substances over state lines

## 2021-01-25 NOTE — Telephone Encounter (Signed)
Pharmacy [facility] [place] [place] requesting refill for:  Requesting: clonazepam Contract: 10/12/2020 UDS: 11/12/20 Last Visit: 09/04/20 Next Visit: none Last Refill: 12/25/20  Please Advise

## 2021-01-25 NOTE — Telephone Encounter (Signed)
Paper fax sent back to pharmacy with that info.

## 2021-01-29 ENCOUNTER — Other Ambulatory Visit: Payer: Self-pay | Admitting: Family Medicine

## 2021-01-29 DIAGNOSIS — I10 Essential (primary) hypertension: Secondary | ICD-10-CM

## 2021-02-22 ENCOUNTER — Other Ambulatory Visit: Payer: Self-pay | Admitting: Family Medicine

## 2021-02-22 DIAGNOSIS — G8929 Other chronic pain: Secondary | ICD-10-CM

## 2021-02-22 NOTE — Telephone Encounter (Signed)
A 15 day supply has been sent. Edward asked Pt to f/u w/ PCP in 2 weeks at 09/04/2020 visit. Can you schedule an appt for her please?

## 2021-02-22 NOTE — Telephone Encounter (Signed)
Patient is checking the status of medication 

## 2021-02-25 NOTE — Telephone Encounter (Signed)
lvm to schedule appt.  

## 2021-02-28 ENCOUNTER — Ambulatory Visit: Payer: 59 | Admitting: Family Medicine

## 2021-02-28 ENCOUNTER — Other Ambulatory Visit: Payer: Self-pay

## 2021-02-28 ENCOUNTER — Encounter: Payer: Self-pay | Admitting: Family Medicine

## 2021-02-28 VITALS — BP 132/82 | HR 79 | Temp 98.5°F | Resp 18 | Ht 65.0 in | Wt 215.6 lb

## 2021-02-28 DIAGNOSIS — E1169 Type 2 diabetes mellitus with other specified complication: Secondary | ICD-10-CM

## 2021-02-28 DIAGNOSIS — I10 Essential (primary) hypertension: Secondary | ICD-10-CM

## 2021-02-28 DIAGNOSIS — Z1211 Encounter for screening for malignant neoplasm of colon: Secondary | ICD-10-CM

## 2021-02-28 DIAGNOSIS — Z23 Encounter for immunization: Secondary | ICD-10-CM | POA: Diagnosis not present

## 2021-02-28 DIAGNOSIS — R42 Dizziness and giddiness: Secondary | ICD-10-CM | POA: Diagnosis not present

## 2021-02-28 DIAGNOSIS — Z79899 Other long term (current) drug therapy: Secondary | ICD-10-CM

## 2021-02-28 DIAGNOSIS — E785 Hyperlipidemia, unspecified: Secondary | ICD-10-CM

## 2021-02-28 DIAGNOSIS — F988 Other specified behavioral and emotional disorders with onset usually occurring in childhood and adolescence: Secondary | ICD-10-CM

## 2021-02-28 DIAGNOSIS — F419 Anxiety disorder, unspecified: Secondary | ICD-10-CM

## 2021-02-28 DIAGNOSIS — G8929 Other chronic pain: Secondary | ICD-10-CM

## 2021-02-28 DIAGNOSIS — E039 Hypothyroidism, unspecified: Secondary | ICD-10-CM

## 2021-02-28 DIAGNOSIS — E1151 Type 2 diabetes mellitus with diabetic peripheral angiopathy without gangrene: Secondary | ICD-10-CM

## 2021-02-28 DIAGNOSIS — E1165 Type 2 diabetes mellitus with hyperglycemia: Secondary | ICD-10-CM | POA: Diagnosis not present

## 2021-02-28 DIAGNOSIS — IMO0002 Reserved for concepts with insufficient information to code with codable children: Secondary | ICD-10-CM

## 2021-02-28 DIAGNOSIS — R8290 Unspecified abnormal findings in urine: Secondary | ICD-10-CM | POA: Diagnosis not present

## 2021-02-28 LAB — LIPID PANEL
Cholesterol: 242 mg/dL — ABNORMAL HIGH (ref 0–200)
HDL: 46.8 mg/dL (ref >39.00)
NonHDL: 195.48
Total CHOL/HDL Ratio: 5
Triglycerides: 273 mg/dL — ABNORMAL HIGH (ref 0.0–149.0)
VLDL: 54.6 mg/dL — ABNORMAL HIGH (ref 0.0–40.0)

## 2021-02-28 LAB — COMPREHENSIVE METABOLIC PANEL
ALT: 12 U/L (ref 0–35)
AST: 16 U/L (ref 0–37)
Albumin: 4.1 g/dL (ref 3.5–5.2)
Alkaline Phosphatase: 85 U/L (ref 39–117)
BUN: 5 mg/dL — ABNORMAL LOW (ref 6–23)
CO2: 27 mEq/L (ref 19–32)
Calcium: 9.7 mg/dL (ref 8.4–10.5)
Chloride: 105 mEq/L (ref 96–112)
Creatinine, Ser: 0.77 mg/dL (ref 0.40–1.20)
GFR: 86.59 mL/min (ref >60.00)
Glucose, Bld: 124 mg/dL — ABNORMAL HIGH (ref 70–99)
Potassium: 4.3 mEq/L (ref 3.5–5.1)
Sodium: 138 mEq/L (ref 135–145)
Total Bilirubin: 0.9 mg/dL (ref 0.2–1.2)
Total Protein: 7.1 g/dL (ref 6.0–8.3)

## 2021-02-28 LAB — POC URINALSYSI DIPSTICK (AUTOMATED)
Bilirubin, UA: NEGATIVE
Blood, UA: NEGATIVE
Glucose, UA: NEGATIVE
Ketones, UA: NEGATIVE
Leukocytes, UA: NEGATIVE
Nitrite, UA: NEGATIVE
Protein, UA: POSITIVE — AB
Spec Grav, UA: >=1.03 — AB (ref 1.010–1.025)
Urobilinogen, UA: 0.2 E.U./dL
pH, UA: 5 (ref 5.0–8.0)

## 2021-02-28 LAB — MICROALBUMIN / CREATININE URINE RATIO
Creatinine,U: 158.1 mg/dL
Microalb Creat Ratio: 0.9 mg/g (ref 0.0–30.0)
Microalb, Ur: 1.5 mg/dL (ref 0.0–1.9)

## 2021-02-28 LAB — LDL CHOLESTEROL, DIRECT: Direct LDL: 134 mg/dL

## 2021-02-28 LAB — HEMOGLOBIN A1C: Hgb A1c MFr Bld: 6.9 % — ABNORMAL HIGH (ref 4.6–6.5)

## 2021-02-28 MED ORDER — FLUOXETINE HCL 20 MG PO CAPS: 20.0000 mg | ORAL_CAPSULE | Freq: Every day | ORAL | 3 refills | Status: AC

## 2021-02-28 MED ORDER — METFORMIN HCL ER 500 MG PO TB24: 500.0000 mg | ORAL_TABLET | Freq: Every day | ORAL | 1 refills | Status: DC

## 2021-02-28 MED ORDER — LISINOPRIL-HYDROCHLOROTHIAZIDE 20-12.5 MG PO TABS: 2.0000 | ORAL_TABLET | Freq: Every day | ORAL | 1 refills | Status: DC

## 2021-02-28 MED ORDER — MECLIZINE HCL 25 MG PO TABS: 25.0000 mg | ORAL_TABLET | Freq: Three times a day (TID) | ORAL | 0 refills | Status: AC | PRN

## 2021-02-28 MED ORDER — ROSUVASTATIN CALCIUM 20 MG PO TABS: 20.0000 mg | ORAL_TABLET | Freq: Every day | ORAL | 3 refills | Status: DC

## 2021-02-28 MED ORDER — GABAPENTIN 600 MG PO TABS: 600.0000 mg | ORAL_TABLET | Freq: Three times a day (TID) | ORAL | 1 refills | Status: DC

## 2021-02-28 NOTE — Assessment & Plan Note (Signed)
hgba1c to be checked, minimize simple carbs. Increase exercise as tolerated. Continue current meds  

## 2021-02-28 NOTE — Progress Notes (Signed)
Patient ID: [name], female    DOB: 1966-01-16  Age: 55 y.o. MRN: [id number]    Subjective:  Subjective  HPI [name] presents for f/u dm, chol and bp  She still c/o dizziness -- has seen ent for ruptured ear drum and serous otitis -- she needs to f/u with them  HYPERTENSION   Blood pressure range-up and down  Chest pain- no      Dyspnea- no Lightheadedness- no   Edema- no  Other side effects - no   Medication compliance: good Low salt diet- yes    DIABETES    Blood Sugar ranges-160-187  Polyuria- no New Visual problems- no  Hypoglycemic symptoms- no  Other side effects-no Medication compliance - good Last eye exam- due Foot exam- today   HYPERLIPIDEMIA  Medication compliance- good RUQ pain- no  Muscle aches- no Other side effects-no     Review of Systems  Constitutional: Negative for appetite change, diaphoresis, fatigue and unexpected weight change.  Eyes: Negative for pain, redness and visual disturbance.  Respiratory: Negative for cough, chest tightness, shortness of breath and wheezing.   Cardiovascular: Negative for chest pain, palpitations and leg swelling.  Endocrine: Negative for cold intolerance, heat intolerance, polydipsia, polyphagia and polyuria.  Genitourinary: Negative for difficulty urinating, dysuria and frequency.  Neurological: Positive for dizziness. Negative for light-headedness, numbness and headaches.    History Past Medical History:  Diagnosis Date  . Abdominal pain   . Anemia   . Arthritis   . Bruises easily   . Depression   . Extremity numbness    legs, feet, arms, hands  . Foot fracture    bilateral  . Frequent falls   . Hair loss   . Hypotension   . Irritable bowel   . Kidney stones   . Leg pain    when driving long distances  . Memory loss   . Migraines   . Panic disorder   . Vision problems     She has a past surgical history that includes Cholecystectomy; Laparoscopic gastric banding; Foot surgery  (Left); Abdominoplasty; Abdominal hysterectomy; and Tonsillectomy.   Her family history includes Deep vein thrombosis in her mother; Diabetes in her maternal grandmother; Heart attack in her father; Heart disease in her maternal grandmother; Heart disease (age of onset: 63) in her maternal grandfather; Hypertension in her father and mother; Panic disorder in her mother.She reports that she has been smoking. She has a 30.00 pack-year smoking history. She has never used smokeless tobacco. She reports that she does not drink alcohol and does not use drugs.  Current Outpatient Medications on File Prior to Visit  Medication Sig Dispense Refill  . albuterol (PROAIR HFA) 108 (90 Base) MCG/ACT inhaler Inhale 2 puffs into the lungs every 6 (six) hours as needed for wheezing or shortness of breath. 1 Inhaler 3  . clonazePAM (KLONOPIN) 1 MG tablet TAKE 1 TABLET (1 MG TOTAL) BY MOUTH 2 (TWO) TIMES DAILY AS NEEDED. 60 tablet 0  . cyanocobalamin (,VITAMIN B-12,) 1000 MCG/ML injection INJECT 1 ML INTO THE MUSCLE EVERY 30 DAYS. 1 mL 2  . glucose blood (ONETOUCH VERIO) test strip Use as directed once day.  DX: E11.51. 100 each 1  . lisdexamfetamine (VYVANSE) 70 MG capsule Take 1 capsule (70 mg total) by mouth daily. 30 capsule 0  . meloxicam (MOBIC) 15 MG tablet TAKE 1 TABLET BY MOUTH EVERY DAY 30 tablet 0  . ONETOUCH DELICA LANCETS FINE MISC USE AS DIRECTED ONCE  A DAY. DX: E11.51 *INSURANCE ONLY COVERS 30 DAY SUPPLY *PT NEEDS OFFICE VISIT 100 each 0  . SYRINGE-NEEDLE, DISP, 3 ML (B-D 3CC LUER-LOK SYR 25GX1") 25G X 1" 3 ML MISC USE AS DIRECTED MONTHLY WITH B12 3 each 3  . traMADol (ULTRAM) 50 MG tablet Take 1 tablet (50 mg total) by mouth every 6 (six) hours as needed for severe pain. 4 tablet 0  . Vitamin D, Ergocalciferol, (DRISDOL) 1.25 MG (50000 UT) CAPS capsule Take 1 capsule (50,000 Units total) by mouth every 7 (seven) days. 12 capsule 0   No current facility-administered medications on file prior to visit.      Objective:  Objective  Physical Exam Vitals and nursing note reviewed.  Constitutional:      Appearance: She is well-developed and well-nourished.  HENT:     Head: Normocephalic and atraumatic.     Right Ear: A middle ear effusion is present. There is no impacted cerumen.  Eyes:     Extraocular Movements: EOM normal.     Conjunctiva/sclera: Conjunctivae normal.  Neck:     Thyroid: No thyromegaly.     Vascular: No carotid bruit or JVD.  Cardiovascular:     Rate and Rhythm: Normal rate and regular rhythm.     Heart sounds: Normal heart sounds. No murmur heard.   Pulmonary:     Effort: Pulmonary effort is normal. No respiratory distress.     Breath sounds: Normal breath sounds. No wheezing or rales.  Chest:     Chest wall: No tenderness.  Musculoskeletal:        General: No edema.     Cervical back: Normal range of motion and neck supple.  Neurological:     Mental Status: She is alert and oriented to person, place, and time.  Psychiatric:        Mood and Affect: Mood and affect normal.    Diabetic Foot Exam - Simple   Simple Foot Form Diabetic Foot exam was performed with the following findings: Yes 02/28/2021 10:07 AM  Visual Inspection No deformities, no ulcerations, no other skin breakdown bilaterally: Yes Sensation Testing See comments: Yes Pulse Check Posterior Tibialis and Dorsalis pulse intact bilaterally: Yes Comments No feeling in L foot--- not new R foot normal monofilament     BP 132/82 (BP Location: Right Arm, Patient Position: Sitting, Cuff Size: Large)   Pulse 79   Temp 98.5 F (36.9 C) (Oral)   Resp 18   Ht 5\' 5"  (1.651 m)   Wt 215 lb 9.6 oz (97.8 kg)   SpO2 98%   BMI 35.88 kg/m  Wt Readings from Last 3 Encounters:  02/28/21 215 lb 9.6 oz (97.8 kg)  09/04/20 207 lb 9.6 oz (94.2 kg)  12/14/18 232 lb (105.2 kg)     Lab Results  Component Value Date   WBC 7.0 09/04/2020   HGB 15.0 09/04/2020   HCT 43.4 09/04/2020   PLT 280  09/04/2020   GLUCOSE 106 (H) 09/04/2020   CHOL 210 (H) 09/04/2020   TRIG 210 (H) 09/04/2020   HDL 34 (L) 09/04/2020   LDLDIRECT 141.0 12/15/2017   LDLCALC 141 (H) 09/04/2020   ALT 22 09/04/2020   AST 33 09/04/2020   NA 141 09/04/2020   K 3.9 09/04/2020   CL 107 09/04/2020   CREATININE 0.77 09/04/2020   BUN 6 (L) 09/04/2020   CO2 23 09/04/2020   TSH 1.35 09/04/2020   INR 0.97 06/23/2014   HGBA1C 5.8 (H) 09/04/2020  MICROALBUR 0.9 12/15/2017    CT Head Wo Contrast  Result Date: 09/04/2020 CLINICAL DATA:  Dizziness. EXAM: CT HEAD WITHOUT CONTRAST TECHNIQUE: Contiguous axial images were obtained from the base of the skull through the vertex without intravenous contrast. COMPARISON:  December 31, 2010. FINDINGS: Brain: No evidence of acute infarction, hemorrhage, hydrocephalus, extra-axial collection or mass lesion/mass effect. Vascular: No hyperdense vessel or unexpected calcification. Skull: Normal. Negative for fracture or focal lesion. Sinuses/Orbits: No acute finding. Other: None. IMPRESSION: Normal head CT. Electronically Signed   By: Lupita RaiderJames  Green Jr M.D.   On: 09/04/2020 11:56   DG KNEE 3 VIEW LEFT  Result Date: 09/04/2020 CLINICAL DATA:  Left knee pain for 2 months without known injury. EXAM: LEFT KNEE - 3 VIEW COMPARISON:  None. FINDINGS: No evidence of fracture, dislocation, or joint effusion. No evidence of arthropathy or other focal bone abnormality. Soft tissues are unremarkable. IMPRESSION: Negative. Electronically Signed   By: Lupita RaiderJames  Green Jr M.D.   On: 09/04/2020 11:57     Assessment & Plan:  Plan  I have changed Keagan G. Lupinacci's rosuvastatin. I am also having her maintain her albuterol, OneTouch Delica Lancets Fine, traMADol, Vitamin D (Ergocalciferol), glucose blood, meloxicam, B-D 3CC LUER-LOK SYR 25GX1", clonazePAM, cyanocobalamin, lisdexamfetamine, FLUoxetine, gabapentin, lisinopril-hydrochlorothiazide, meclizine, and metFORMIN.  Meds ordered this encounter   Medications  . FLUoxetine (PROZAC) 20 MG capsule    Sig: Take 1 capsule (20 mg total) by mouth daily. Pt needs OV for further refills    Dispense:  90 capsule    Refill:  3  . gabapentin (NEURONTIN) 600 MG tablet    Sig: Take 1 tablet (600 mg total) by mouth 3 (three) times daily.    Dispense:  270 tablet    Refill:  1  . lisinopril-hydrochlorothiazide (ZESTORETIC) 20-12.5 MG tablet    Sig: Take 2 tablets by mouth daily.    Dispense:  180 tablet    Refill:  1  . meclizine (ANTIVERT) 25 MG tablet    Sig: Take 1 tablet (25 mg total) by mouth 3 (three) times daily as needed for dizziness.    Dispense:  30 tablet    Refill:  0  . metFORMIN (GLUCOPHAGE-XR) 500 MG 24 hr tablet    Sig: Take 1 tablet (500 mg total) by mouth daily with breakfast.    Dispense:  90 tablet    Refill:  1  . rosuvastatin (CRESTOR) 20 MG tablet    Sig: Take 1 tablet (20 mg total) by mouth daily.    Dispense:  90 tablet    Refill:  3    Problem List Items Addressed This Visit      Unprioritized   Attention deficit disorder (ADD) without hyperactivity    Stable uds / contract       DM (diabetes mellitus) type II uncontrolled, periph vascular disorder (HCC)    hgba1c to be checked , minimize simple carbs. Increase exercise as tolerated. Continue current meds       Relevant Medications   lisinopril-hydrochlorothiazide (ZESTORETIC) 20-12.5 MG tablet   metFORMIN (GLUCOPHAGE-XR) 500 MG 24 hr tablet   rosuvastatin (CRESTOR) 20 MG tablet   Hyperlipidemia associated with type 2 diabetes mellitus (HCC) - Primary    Encouraged heart healthy diet, increase exercise, avoid trans fats, consider a krill oil cap daily      Relevant Medications   lisinopril-hydrochlorothiazide (ZESTORETIC) 20-12.5 MG tablet   metFORMIN (GLUCOPHAGE-XR) 500 MG 24 hr tablet   rosuvastatin (CRESTOR) 20 MG tablet  Other Relevant Orders   Lipid panel   Comprehensive metabolic panel   Hypothyroidism    Check labs        Other  Visit Diagnoses    Other chronic pain       Relevant Medications   FLUoxetine (PROZAC) 20 MG capsule   gabapentin (NEURONTIN) 600 MG tablet   Essential hypertension       Relevant Medications   lisinopril-hydrochlorothiazide (ZESTORETIC) 20-12.5 MG tablet   rosuvastatin (CRESTOR) 20 MG tablet   Other Relevant Orders   Lipid panel   Hemoglobin A1c   Comprehensive metabolic panel   Microalbumin / creatinine urine ratio   Vertigo       Relevant Medications   meclizine (ANTIVERT) 25 MG tablet   Type 2 diabetes mellitus with hyperglycemia, without long-term current use of insulin (HCC)       Relevant Medications   lisinopril-hydrochlorothiazide (ZESTORETIC) 20-12.5 MG tablet   metFORMIN (GLUCOPHAGE-XR) 500 MG 24 hr tablet   rosuvastatin (CRESTOR) 20 MG tablet   Other Relevant Orders   Hemoglobin A1c   Comprehensive metabolic panel   Microalbumin / creatinine urine ratio   Anxiety       Relevant Medications   FLUoxetine (PROZAC) 20 MG capsule   Need for pneumococcal vaccination       Relevant Orders   Pneumococcal polysaccharide vaccine 23-valent greater than or equal to 2yo subcutaneous/IM (Completed)   Abnormal urine odor       Relevant Orders   POCT Urinalysis Dipstick (Automated) (Completed)   High risk medication use       Relevant Orders   DRUG MONITORING, PANEL 8 WITH CONFIRMATION, URINE   Screening for colon cancer       Relevant Orders   Cologuard      Follow-up: Return in about 6 months (around 08/31/2021) for annual exam, fasting and pap.  Donato Schultz, DO

## 2021-02-28 NOTE — Assessment & Plan Note (Signed)
Encouraged heart healthy diet, increase exercise, avoid trans fats, consider a krill oil cap daily 

## 2021-02-28 NOTE — Assessment & Plan Note (Signed)
Check labs 

## 2021-02-28 NOTE — Assessment & Plan Note (Signed)
Stable uds / contract

## 2021-03-03 ENCOUNTER — Other Ambulatory Visit: Payer: Self-pay | Admitting: Family Medicine

## 2021-03-03 LAB — DRUG MONITORING, PANEL 8 WITH CONFIRMATION, URINE
6 Acetylmorphine: NEGATIVE ng/mL (ref <10)
Alcohol Metabolites: NEGATIVE ng/mL
Amphetamine: 305 ng/mL — ABNORMAL HIGH (ref <250)
Amphetamines: POSITIVE ng/mL — AB (ref <500)
Benzodiazepines: NEGATIVE ng/mL (ref <100)
Buprenorphine, Urine: NEGATIVE ng/mL (ref <5)
Cocaine Metabolite: NEGATIVE ng/mL (ref <150)
Creatinine: 172.2 mg/dL
MDMA: NEGATIVE ng/mL (ref <500)
Marijuana Metabolite: 1883 ng/mL — ABNORMAL HIGH (ref <5)
Marijuana Metabolite: POSITIVE ng/mL — AB (ref <20)
Methamphetamine: NEGATIVE ng/mL (ref <250)
Opiates: NEGATIVE ng/mL (ref <100)
Oxidant: NEGATIVE ug/mL
Oxycodone: NEGATIVE ng/mL (ref <100)
pH: 5.7 (ref 4.5–9.0)

## 2021-03-03 LAB — DM TEMPLATE

## 2021-03-07 ENCOUNTER — Other Ambulatory Visit: Payer: Self-pay | Admitting: Family Medicine

## 2021-03-07 DIAGNOSIS — I10 Essential (primary) hypertension: Secondary | ICD-10-CM

## 2021-03-15 ENCOUNTER — Other Ambulatory Visit: Payer: Self-pay | Admitting: Medical

## 2021-03-15 DIAGNOSIS — E1169 Type 2 diabetes mellitus with other specified complication: Secondary | ICD-10-CM

## 2021-04-01 ENCOUNTER — Other Ambulatory Visit: Payer: Self-pay | Admitting: Medical

## 2021-04-01 DIAGNOSIS — E785 Hyperlipidemia, unspecified: Secondary | ICD-10-CM

## 2021-04-01 DIAGNOSIS — E1169 Type 2 diabetes mellitus with other specified complication: Secondary | ICD-10-CM

## 2021-04-06 ENCOUNTER — Other Ambulatory Visit: Payer: Self-pay | Admitting: Family Medicine

## 2021-04-06 DIAGNOSIS — E1165 Type 2 diabetes mellitus with hyperglycemia: Secondary | ICD-10-CM

## 2021-04-08 ENCOUNTER — Other Ambulatory Visit: Payer: Self-pay

## 2021-04-08 DIAGNOSIS — E538 Deficiency of other specified B group vitamins: Secondary | ICD-10-CM

## 2021-04-08 MED ORDER — "VANISHPOINT SYRINGE 25G X 1"" 3 ML MISC": 0 refills | Status: AC

## 2021-04-09 ENCOUNTER — Other Ambulatory Visit: Payer: Self-pay | Admitting: Family Medicine

## 2021-04-09 DIAGNOSIS — F909 Attention-deficit hyperactivity disorder, unspecified type: Secondary | ICD-10-CM

## 2021-04-09 MED ORDER — LISDEXAMFETAMINE DIMESYLATE 70 MG PO CAPS: 70.0000 mg | ORAL_CAPSULE | Freq: Every day | ORAL | 0 refills | Status: AC

## 2021-04-09 NOTE — Telephone Encounter (Signed)
Medication: lisdexamfetamine (VYVANSE) 70 MG capsule [[id number]]      Has the patient contacted their pharmacy?  (If no, request that the patient contact the pharmacy for the refill.) (If yes, when and what did the pharmacy advise?)     Preferred Pharmacy (with phone number or street name): [facility] [address] [place], [place] - [address] [facility] [address] [place] [address]  [address] [place] [address]  Phone:  [phone] Fax:  [phone]  DEA #:  [id number]     Agent: Please be advised that RX refills may take up to 3 business days. We ask that you follow-up with your pharmacy.

## 2021-04-09 NOTE — Telephone Encounter (Signed)
Requesting: Vyvanse  Contract: 02/28/21 UDS: 02/28/21 Last Visit: 02/28/21 Next Visit: none Last Refill: 01/17/2021

## 2021-04-10 ENCOUNTER — Other Ambulatory Visit: Payer: Self-pay | Admitting: Family Medicine

## 2021-04-10 DIAGNOSIS — E1165 Type 2 diabetes mellitus with hyperglycemia: Secondary | ICD-10-CM

## 2021-05-04 ENCOUNTER — Other Ambulatory Visit: Payer: Self-pay | Admitting: Family Medicine

## 2021-05-04 DIAGNOSIS — E538 Deficiency of other specified B group vitamins: Secondary | ICD-10-CM

## 2021-08-21 ENCOUNTER — Telehealth: Payer: Self-pay | Admitting: Family Medicine

## 2021-08-21 DIAGNOSIS — G8929 Other chronic pain: Secondary | ICD-10-CM

## 2021-08-21 MED ORDER — GABAPENTIN 600 MG PO TABS: 600.0000 mg | ORAL_TABLET | Freq: Three times a day (TID) | ORAL | 1 refills | Status: DC

## 2021-08-21 NOTE — Telephone Encounter (Signed)
Refill sent.

## 2021-08-21 NOTE — Telephone Encounter (Signed)
Medication: gabapentin (NEURONTIN) 600 MG tablet  Has the patient contacted their pharmacy? Yes.   (If no, request that the patient contact the pharmacy for the refill.) (If yes, when and what did the pharmacy advise?)  Preferred Pharmacy (with phone number or street name): [id number] [place], [place] - [address]  [address], [place] [address]  Phone:  [phone]    Agent: Please be advised that RX refills may take up to 3 business days. We ask that you follow-up with your pharmacy.

## 2021-08-27 ENCOUNTER — Other Ambulatory Visit: Payer: Self-pay

## 2021-08-28 ENCOUNTER — Ambulatory Visit: Payer: 59 | Admitting: Medical

## 2021-08-28 ENCOUNTER — Other Ambulatory Visit: Payer: Self-pay | Admitting: Medical

## 2021-08-28 ENCOUNTER — Ambulatory Visit (HOSPITAL_BASED_OUTPATIENT_CLINIC_OR_DEPARTMENT_OTHER)
Admission: RE | Admit: 2021-08-28 | Discharge: 2021-08-28 | Disposition: A | Discharge status: Home / Self Care | Payer: 59 | Source: Ambulatory Visit | Attending: Medical | Admitting: Medical

## 2021-08-28 ENCOUNTER — Telehealth: Payer: Self-pay | Admitting: Medical

## 2021-08-28 VITALS — BP 128/72 | HR 83 | Temp 97.8°F | Resp 18 | Ht 65.0 in | Wt 215.6 lb

## 2021-08-28 DIAGNOSIS — IMO0002 Reserved for concepts with insufficient information to code with codable children: Secondary | ICD-10-CM

## 2021-08-28 DIAGNOSIS — R42 Dizziness and giddiness: Secondary | ICD-10-CM

## 2021-08-28 DIAGNOSIS — Z8701 Personal history of pneumonia (recurrent): Secondary | ICD-10-CM

## 2021-08-28 DIAGNOSIS — H9201 Otalgia, right ear: Secondary | ICD-10-CM | POA: Diagnosis not present

## 2021-08-28 DIAGNOSIS — E1151 Type 2 diabetes mellitus with diabetic peripheral angiopathy without gangrene: Secondary | ICD-10-CM

## 2021-08-28 DIAGNOSIS — E1165 Type 2 diabetes mellitus with hyperglycemia: Secondary | ICD-10-CM | POA: Diagnosis not present

## 2021-08-28 DIAGNOSIS — R059 Cough, unspecified: Secondary | ICD-10-CM

## 2021-08-28 LAB — COMPREHENSIVE METABOLIC PANEL
ALT: 25 U/L (ref 0–35)
AST: 32 U/L (ref 0–37)
Albumin: 3.9 g/dL (ref 3.5–5.2)
Alkaline Phosphatase: 88 U/L (ref 39–117)
BUN: 6 mg/dL (ref 6–23)
CO2: 27 mEq/L (ref 19–32)
Calcium: 9.9 mg/dL (ref 8.4–10.5)
Chloride: 101 mEq/L (ref 96–112)
Creatinine, Ser: 0.83 mg/dL (ref 0.40–1.20)
GFR: 78.86 mL/min (ref >60.00)
Glucose, Bld: 159 mg/dL — ABNORMAL HIGH (ref 70–99)
Potassium: 4.2 mEq/L (ref 3.5–5.1)
Sodium: 136 mEq/L (ref 135–145)
Total Bilirubin: 0.9 mg/dL (ref 0.2–1.2)
Total Protein: 7 g/dL (ref 6.0–8.3)

## 2021-08-28 LAB — CBC WITH DIFFERENTIAL/PLATELET
Basophils Absolute: 0.1 10*3/uL (ref 0.0–0.1)
Basophils Relative: 0.8 % (ref 0.0–3.0)
Eosinophils Absolute: 0.3 10*3/uL (ref 0.0–0.7)
Eosinophils Relative: 4.1 % (ref 0.0–5.0)
HCT: 44.9 % (ref 36.0–46.0)
Hemoglobin: 15.2 g/dL — ABNORMAL HIGH (ref 12.0–15.0)
Lymphocytes Relative: 29.1 % (ref 12.0–46.0)
Lymphs Abs: 2.2 10*3/uL (ref 0.7–4.0)
MCHC: 33.8 g/dL (ref 30.0–36.0)
MCV: 96 fl (ref 78.0–100.0)
Monocytes Absolute: 0.4 10*3/uL (ref 0.1–1.0)
Monocytes Relative: 5.1 % (ref 3.0–12.0)
Neutro Abs: 4.6 10*3/uL (ref 1.4–7.7)
Neutrophils Relative %: 60.9 % (ref 43.0–77.0)
Platelets: 252 10*3/uL (ref 150.0–400.0)
RBC: 4.67 Mil/uL (ref 3.87–5.11)
RDW: 14.2 % (ref 11.5–15.5)
WBC: 7.6 10*3/uL (ref 4.0–10.5)

## 2021-08-28 LAB — HEMOGLOBIN A1C: Hgb A1c MFr Bld: 6.7 % — ABNORMAL HIGH (ref 4.6–6.5)

## 2021-08-28 MED ORDER — FLUTICASONE PROPIONATE 50 MCG/ACT NA SUSP: 2.0000 | Freq: Every day | NASAL | 1 refills | Status: AC

## 2021-08-28 MED ORDER — AMOXICILLIN-POT CLAVULANATE 875-125 MG PO TABS: 1.0000 | ORAL_TABLET | Freq: Two times a day (BID) | ORAL | 0 refills | Status: DC

## 2021-08-28 NOTE — Patient Instructions (Signed)
History of  rt ear pain intermittent for 1 year along with sinus pressure and vertigo.  Negative CT done on last visit when I saw you.  Sinus pressure today on exam.  Will prescribe Augmentin antibiotic, refer to physical therapy for Epley maneuvers and refer to ENT for chronic ear pain and sinus pain.  If you have dizziness or vertigo with gross motor or sensory function deficits then recommend ED evaluation.  History of pneumonia over the last month with intermittent rare cough presently.  Some rough breath sounds on lung auscultation.  Will get chest x-ray today.  For the dizziness and history of pneumonia decided go ahead and get CBC and metabolic panel today.  For history of diabetes will get A1c today.  See if you need adjustment current treatment after lab review.  Follow-up in 2 to 3 weeks or sooner if needed.

## 2021-08-28 NOTE — Telephone Encounter (Signed)
Chart opened to review chart.

## 2021-08-28 NOTE — Progress Notes (Addendum)
Subjective:    Patient ID: [name], female    DOB: 1966-07-13, 55 y.o.   MRN: [id number]  HPI  Pt states she has some sinus pressure, ear pain and some intermittent dizziness. She states this has been going on since July 02, 2011. Pt states she had ruptured TM at [facility]. Later he saw ENT and had procedure to drain ear again on rt side about one week after rupture tm.   I saw pt last year 3 month after her ear issues.   I had ordered CT of head a that time.  IMPRESSION: Normal head CT.   Yesterday she felt like care moving and was not.  Pt states she was seen by clinic in [place] in past month. She states told one month ago and had pneumonia. Some coughing on exam. Pt does smoke.   Pt has been trying to call ENT to schedule. She was given 2-3 month appointment.   Pt has tested negative for covid 2 times in past month. Not testing today since dealing with chronic problems.   Review of Systems  Constitutional:  Negative for chills, fatigue and fever.  HENT:  Positive for congestion, ear pain, sinus pressure and sinus pain.   Respiratory:  Positive for cough. Negative for chest tightness and shortness of breath.   Cardiovascular:  Negative for chest pain and palpitations.  Gastrointestinal:  Negative for abdominal pain, diarrhea, rectal pain and vomiting.  Musculoskeletal:  Negative for back pain and joint swelling.  Skin:  Negative for rash.  Neurological:  Positive for dizziness. Negative for tremors, seizures, weakness, numbness and headaches.       Vertigo description.  Psychiatric/Behavioral:  Negative for confusion, decreased concentration and dysphoric mood. The patient is not nervous/anxious.    Past Medical History:  Diagnosis Date   Abdominal pain    Anemia    Arthritis    Bruises easily    Depression    Extremity numbness    legs, feet, arms, hands   Foot fracture    bilateral   Frequent falls    Hair loss    Hypotension    Irritable bowel     Kidney stones    Leg pain    when driving long distances   Memory loss    Migraines    Panic disorder    Vision problems      Social History   Socioeconomic History   Marital status: Married    Spouse name: Not on file   Number of children: 1   Years of education: 14   Highest education level: Not on file  Occupational History   Occupation: not working  Tobacco Use   Smoking status: Every Day    Packs/day: 1.00    Years: 30.00    Pack years: 30.00    Types: Cigarettes   Smokeless tobacco: Never  Vaping Use   Vaping Use: Never used  Substance and Sexual Activity   Alcohol use: No   Drug use: No   Sexual activity: Yes    Partners: Male  Other Topics Concern   Not on file  Social History Narrative   She lives with husband.  They have one grown child.   Highest level of education:  Associates degree in early childhood   Social Determinants of Health   Financial Resource Strain: Not on file  Food Insecurity: Not on file  Transportation Needs: Not on file  Physical Activity: Not on file  Stress:  Not on file  Social Connections: Not on file  Intimate Partner Violence: Not on file    Past Surgical History:  Procedure Laterality Date   ABDOMINAL HYSTERECTOMY     ABDOMINOPLASTY     CHOLECYSTECTOMY     FOOT SURGERY Left    Plantar Fasc   LAPAROSCOPIC GASTRIC BANDING     TONSILLECTOMY      Family History  Problem Relation Age of Onset   Hypertension Mother    Deep vein thrombosis Mother        Finger   Panic disorder Mother    Hypertension Father    Heart attack Father        Deceased, 72s   Diabetes Maternal Grandmother    Heart disease Maternal Grandmother        chf MI   Heart disease Maternal Grandfather 74       MI    No Known Allergies  Current Outpatient Medications on File Prior to Visit  Medication Sig Dispense Refill   albuterol (PROAIR HFA) 108 (90 Base) MCG/ACT inhaler Inhale 2 puffs into the lungs every 6 (six) hours as needed for  wheezing or shortness of breath. 1 Inhaler 3   clonazePAM (KLONOPIN) 1 MG tablet TAKE 1 TABLET (1 MG TOTAL) BY MOUTH 2 (TWO) TIMES DAILY AS NEEDED. 60 tablet 0   COVID-19 mRNA vaccine, Pfizer, 30 MCG/0.3ML injection INJECT AS DIRECTED .3 mL 0   cyanocobalamin (,VITAMIN B-12,) 1000 MCG/ML injection INJECT 1 ML INTO THE MUSCLE EVERY 30 DAYS. 1 mL 2   FLUoxetine (PROZAC) 20 MG capsule Take 1 capsule (20 mg total) by mouth daily. Pt needs OV for further refills 90 capsule 3   gabapentin (NEURONTIN) 600 MG tablet Take 1 tablet (600 mg total) by mouth 3 (three) times daily. 270 tablet 1   glucose blood (ONETOUCH VERIO) test strip Use as directed once day.  DX: E11.51. 100 each 1   influenza vac split quadrivalent PF (FLUARIX) 0.5 ML injection TAKE AS DIRECTED .5 mL 0   lisdexamfetamine (VYVANSE) 70 MG capsule Take 1 capsule (70 mg total) by mouth daily. 30 capsule 0   lisinopril-hydrochlorothiazide (ZESTORETIC) 20-12.5 MG tablet Take 2 tablets by mouth daily. 180 tablet 1   meclizine (ANTIVERT) 25 MG tablet Take 1 tablet (25 mg total) by mouth 3 (three) times daily as needed for dizziness. 30 tablet 0   meloxicam (MOBIC) 15 MG tablet TAKE 1 TABLET BY MOUTH EVERY DAY 30 tablet 0   metFORMIN (GLUCOPHAGE-XR) 500 MG 24 hr tablet Take 1 tablet (500 mg total) by mouth daily with breakfast. 90 tablet 1   ONETOUCH DELICA LANCETS FINE MISC USE AS DIRECTED ONCE A DAY. DX: E11.51 *INSURANCE ONLY COVERS 30 DAY SUPPLY *PT NEEDS OFFICE VISIT 100 each 0   rosuvastatin (CRESTOR) 20 MG tablet Take 1 tablet (20 mg total) by mouth daily. 90 tablet 1   SYRINGE-NEEDLE, DISP, 3 ML (VANISHPOINT SYR 3CC/25GX1") 25G X 1" 3 ML MISC To use monthly with B12. 50 each 0   traMADol (ULTRAM) 50 MG tablet Take 1 tablet (50 mg total) by mouth every 6 (six) hours as needed for severe pain. 4 tablet 0   Vitamin D, Ergocalciferol, (DRISDOL) 1.25 MG (50000 UT) CAPS capsule Take 1 capsule (50,000 Units total) by mouth every 7 (seven) days. 12  capsule 0   No current facility-administered medications on file prior to visit.    BP 128/72 (BP Location: Left Arm, Patient Position: Sitting, Cuff Size: Large)  Pulse 83   Temp 97.8 F (36.6 C) (Oral)   Resp 18   Ht 5\' 5"  (1.651 m)   Wt 215 lb 9.6 oz (97.8 kg)   SpO2 98%   BMI 35.88 kg/m        Objective:   Physical Exam  General Mental Status- Alert. General Appearance- Not in acute distress.   Skin General: Color- Normal Color. Moisture- Normal Moisture.  Neck Carotid Arteries- Normal color. Moisture- Normal Moisture. No carotid bruits. No JVD.  Chest and Lung Exam Auscultation: Breath Sounds:-even and unlabored. Mild rough breath sounds at base. Rt side bit rougher than left.  Cardiovascular Auscultation:Rythm- Regular. Murmurs & Other Heart Sounds:Auscultation of the heart reveals- No Murmurs.  Abdomen Inspection:-Inspeection Normal. Palpation/Percussion:Note:No mass. Palpation and Percussion of the abdomen reveal- Non Tender, Non Distended + BS, no rebound or guarding.   Neurologic  Cranial Nerve exam:- CN III-XII intact(No nystagmus), symmetric smile. Drift Test:- No drift. Romberg Exam:- Negative.  Heal to Toe Gait exam:-Normal. Finger to Nose:- Normal/Intact Strength:- 5/5 equal and symmetric strength both upper and lower extremities.  On lying supine and turning head brief spinning.  Heent- mild sinus pressure bilaterally. Rt tm mild dull. Left tm normal but looks like old cars.     Assessment & Plan:   Patient Instructions  History of  rt ear pain intermittent for 1 year along with sinus pressure and vertigo.  Negative CT done on last visit when I saw you.  Sinus pressure today on exam.  Will prescribe Augmentin antibiotic, refer to physical therapy for Epley maneuvers and refer to ENT for chronic ear pain and sinus pain.  If you have dizziness or vertigo with gross motor or sensory function deficits then recommend ED evaluation.  History  of pneumonia over the last month with intermittent rare cough presently.  Some rough breath sounds on lung auscultation.  Will get chest x-ray today.  For the dizziness and history of pneumonia decided go ahead and get CBC and metabolic panel today.  For history of diabetes will get A1c today.  See if you need adjustment current treatment after lab review.  Follow-up in 2 to 3 weeks or sooner if needed.   , PA-C

## 2021-08-28 NOTE — Telephone Encounter (Signed)
Pharmacy comment: Product Backordered/Unavailable:NOT AVAILABLE TILL LATE SEPTEMBER, PLEASE ORDER SOMETHING ELSE.

## 2021-08-29 ENCOUNTER — Other Ambulatory Visit: Payer: Self-pay

## 2021-08-29 DIAGNOSIS — R42 Dizziness and giddiness: Secondary | ICD-10-CM

## 2021-08-29 DIAGNOSIS — E1165 Type 2 diabetes mellitus with hyperglycemia: Secondary | ICD-10-CM

## 2021-08-29 DIAGNOSIS — H9201 Otalgia, right ear: Secondary | ICD-10-CM

## 2021-08-29 MED ORDER — METFORMIN HCL ER 500 MG PO TB24: 500.0000 mg | ORAL_TABLET | Freq: Two times a day (BID) | ORAL | 1 refills | Status: DC

## 2021-08-30 NOTE — Telephone Encounter (Signed)
Patient picked up at [facility].

## 2021-09-03 ENCOUNTER — Other Ambulatory Visit: Payer: Self-pay | Admitting: Family Medicine

## 2021-09-03 DIAGNOSIS — I10 Essential (primary) hypertension: Secondary | ICD-10-CM

## 2021-09-19 ENCOUNTER — Other Ambulatory Visit: Payer: Self-pay | Admitting: Family Medicine

## 2021-09-19 ENCOUNTER — Telehealth: Payer: Self-pay | Admitting: Family Medicine

## 2021-09-19 DIAGNOSIS — F411 Generalized anxiety disorder: Secondary | ICD-10-CM

## 2021-09-19 DIAGNOSIS — F419 Anxiety disorder, unspecified: Secondary | ICD-10-CM

## 2021-09-19 MED ORDER — CLONAZEPAM 0.5 MG PO TABS: 0.5000 mg | ORAL_TABLET | Freq: Two times a day (BID) | ORAL | 1 refills | Status: AC | PRN

## 2021-09-19 NOTE — Telephone Encounter (Signed)
Requesting: clonazepam 1mg   Contract: 02/28/21  UDS: 02/28/2021 Last Visit: 08/28/2021 Next Visit: None Last Refill:  12/25/2020 #60 and 0RF  Please Advise

## 2021-09-19 NOTE — Telephone Encounter (Signed)
Pt. Called in and stated she is starting to have reoccurring panic attacks again. She believes its from the vertigo and the stress of moving. She wanted to see if she can get a refill on the medication below due to it previously helping her in the past.    Medication: clonazePAM (KLONOPIN) 1 MG tablet   Has the patient contacted their pharmacy? No. (If no, request that the patient contact the pharmacy for the refill.) (If yes, when and what did the pharmacy advise?)  Preferred Pharmacy (with phone number or street name): [id number] [place], [place] - [address]  [address], [place] [address]  Phone:  [phone]    Agent: Please be advised that RX refills may take up to 3 business days. We ask that you follow-up with your pharmacy.

## 2021-10-14 ENCOUNTER — Other Ambulatory Visit: Payer: Self-pay | Admitting: Family Medicine

## 2021-10-14 DIAGNOSIS — E538 Deficiency of other specified B group vitamins: Secondary | ICD-10-CM

## 2021-11-15 ENCOUNTER — Other Ambulatory Visit: Payer: Self-pay | Admitting: Family Medicine

## 2021-11-15 DIAGNOSIS — E1165 Type 2 diabetes mellitus with hyperglycemia: Secondary | ICD-10-CM

## 2022-02-04 ENCOUNTER — Other Ambulatory Visit: Payer: Self-pay | Admitting: Family Medicine

## 2022-02-04 DIAGNOSIS — E538 Deficiency of other specified B group vitamins: Secondary | ICD-10-CM

## 2022-02-07 ENCOUNTER — Other Ambulatory Visit: Payer: Self-pay | Admitting: Medical

## 2022-02-07 ENCOUNTER — Other Ambulatory Visit: Payer: Self-pay | Admitting: *Deleted

## 2022-02-07 ENCOUNTER — Other Ambulatory Visit: Payer: Self-pay | Admitting: Family Medicine

## 2022-02-07 ENCOUNTER — Telehealth: Payer: Self-pay | Admitting: Family Medicine

## 2022-02-07 DIAGNOSIS — G8929 Other chronic pain: Secondary | ICD-10-CM

## 2022-02-07 DIAGNOSIS — E1165 Type 2 diabetes mellitus with hyperglycemia: Secondary | ICD-10-CM

## 2022-02-07 MED ORDER — GABAPENTIN 600 MG PO TABS: 600.0000 mg | ORAL_TABLET | Freq: Three times a day (TID) | ORAL | 1 refills | Status: AC

## 2022-02-07 NOTE — Telephone Encounter (Signed)
Pt stated she thought a call from our office for prior auth for this medication. There is no documentation regarding this.   Medication:gabapentin (NEURONTIN) 600 MG tablet   Has the patient contacted their pharmacy? Yes.     [facility]: [facility] [address] [place], [place] - [phone] [facility] [address] AT [facility] [address]  [address] [place], [place] [address]  Phone:  [phone]  Fax:  [phone]

## 2022-02-07 NOTE — Telephone Encounter (Signed)
Refill sent.

## 2022-03-28 ENCOUNTER — Other Ambulatory Visit: Payer: Self-pay | Admitting: Family Medicine

## 2022-03-28 DIAGNOSIS — E1165 Type 2 diabetes mellitus with hyperglycemia: Secondary | ICD-10-CM

## 2022-04-22 ENCOUNTER — Other Ambulatory Visit: Payer: Self-pay | Admitting: Family Medicine

## 2022-04-22 DIAGNOSIS — E1165 Type 2 diabetes mellitus with hyperglycemia: Secondary | ICD-10-CM

## 2022-05-03 ENCOUNTER — Other Ambulatory Visit: Payer: Self-pay | Admitting: Medical

## 2022-05-03 DIAGNOSIS — E538 Deficiency of other specified B group vitamins: Secondary | ICD-10-CM

## 2022-05-08 ENCOUNTER — Other Ambulatory Visit (HOSPITAL_COMMUNITY): Payer: Self-pay

## 2022-08-04 ENCOUNTER — Other Ambulatory Visit: Payer: Self-pay | Admitting: Medical

## 2022-08-04 DIAGNOSIS — E538 Deficiency of other specified B group vitamins: Secondary | ICD-10-CM

## 2023-02-16 ENCOUNTER — Other Ambulatory Visit: Payer: Self-pay | Admitting: Family Medicine

## 2023-02-16 DIAGNOSIS — G8929 Other chronic pain: Secondary | ICD-10-CM

## 2023-03-09 ENCOUNTER — Other Ambulatory Visit: Payer: Self-pay | Admitting: Family Medicine

## 2023-03-09 DIAGNOSIS — G8929 Other chronic pain: Secondary | ICD-10-CM

## 2023-06-17 IMAGING — CR DG CHEST 2V
2 series · 2 of 2 positions shown · non-contrast
Comparison: 09/27/2013

CLINICAL DATA: hx of pneumonia with rare intermittent cough and
fatigue. tx with antiboitc about 4 weeks ago.

EXAM:
CHEST - 2 VIEW

[w chest pa]
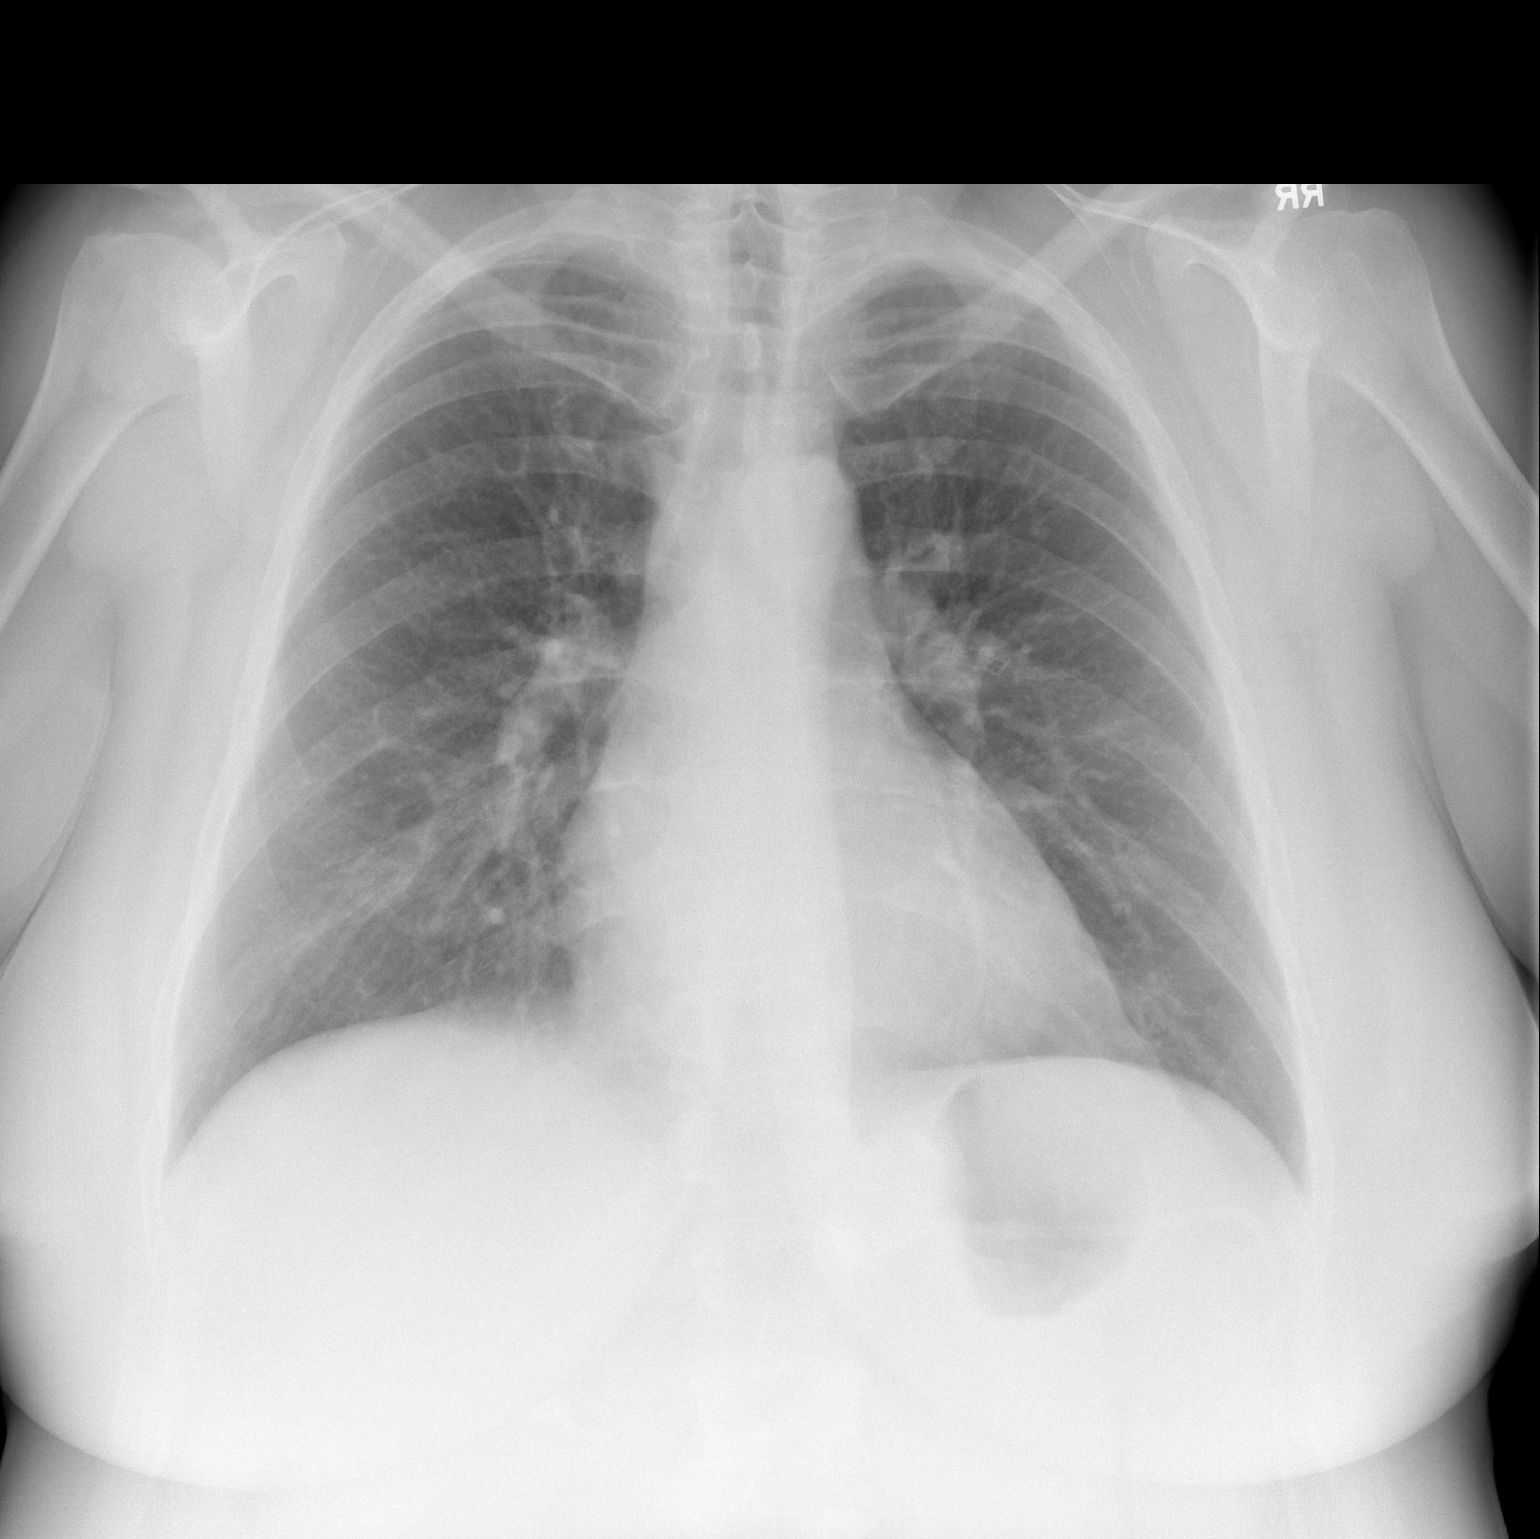

[w chest lat]
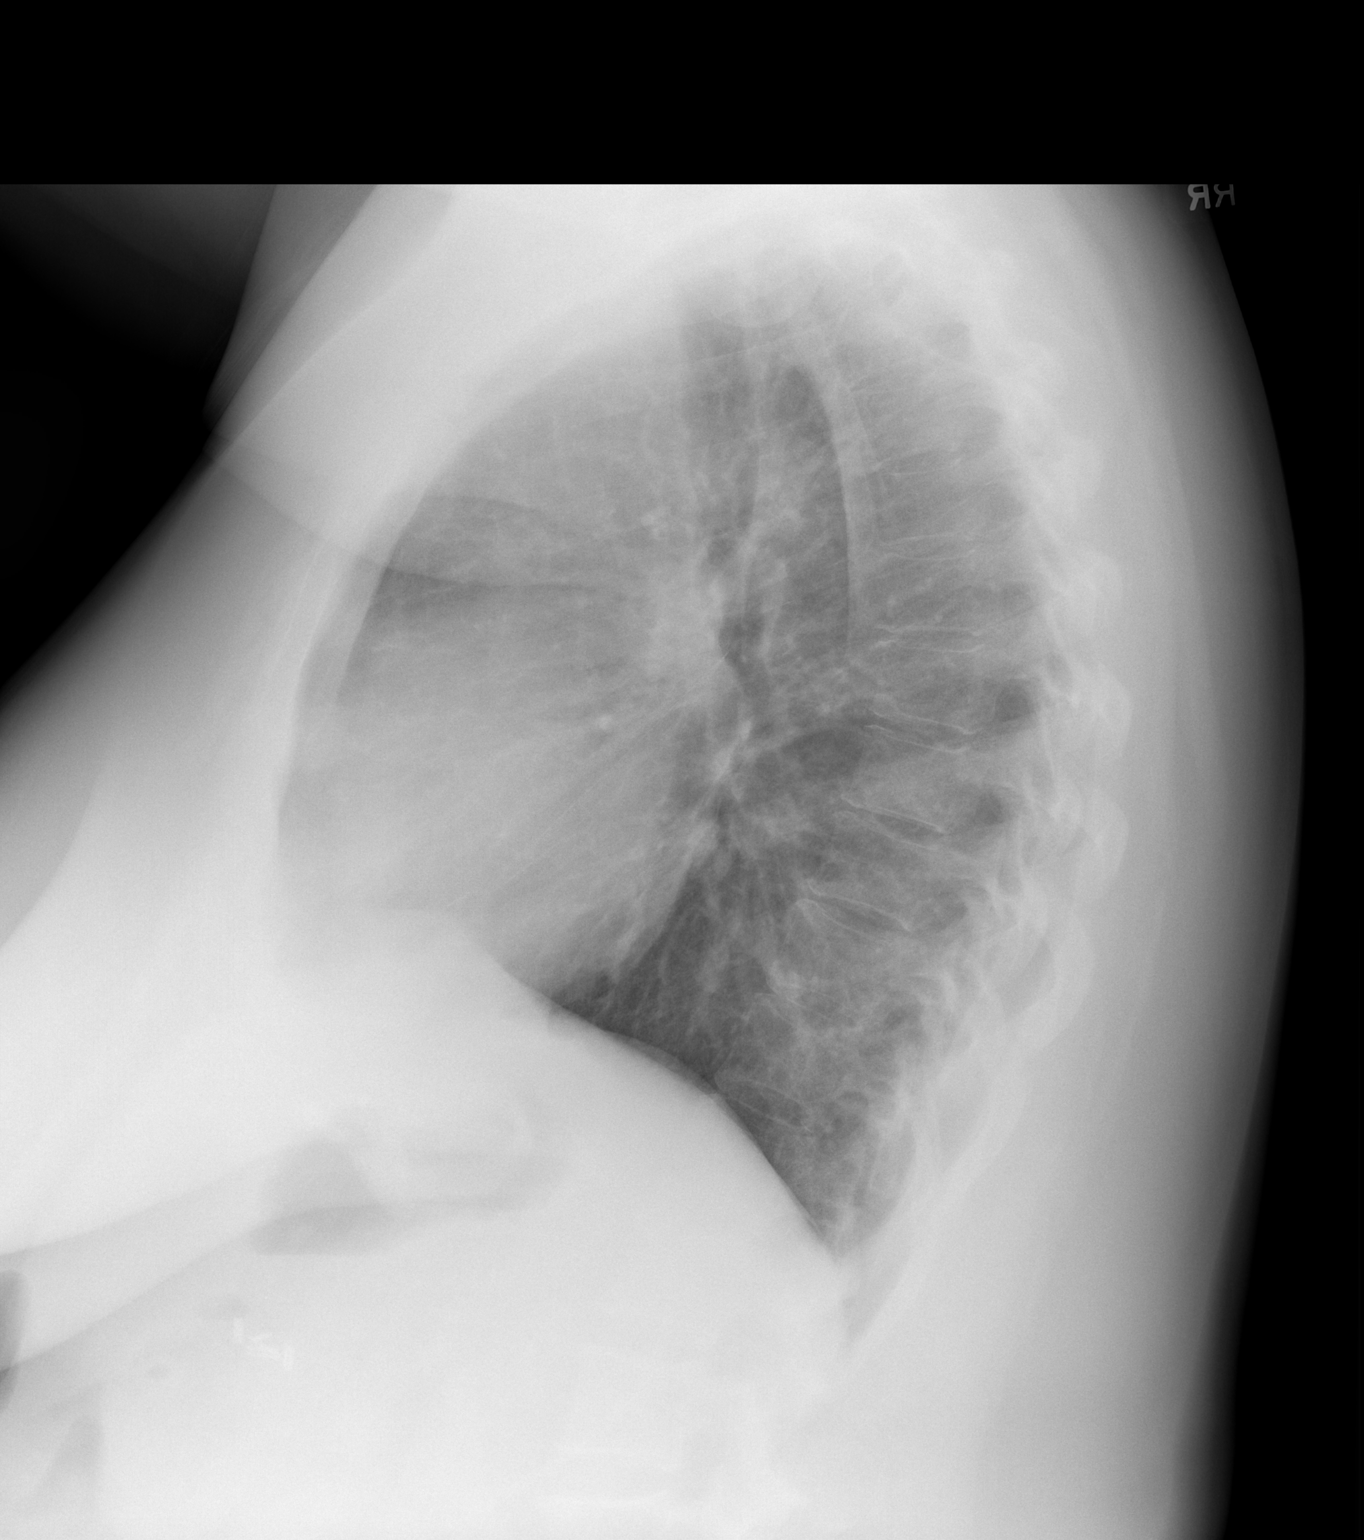

[2 of 2 positions shown; findings below may reference images not displayed]

FINDINGS: The heart size and mediastinal contours are within normal limits. No
focal airspace consolidation, pleural effusion, or pneumothorax. The
visualized skeletal structures are unremarkable.
IMPRESSION: No active cardiopulmonary disease.
